# Patient Record
Sex: Male | Born: 1935 | Race: Black or African American | Hispanic: No | Marital: Married | Smoking: Never smoker
Health system: Southern US, Community
[De-identification: ages and names within clinical notes are randomized; demographics above are authoritative.]

## PROBLEM LIST (undated history)

## (undated) DIAGNOSIS — K760 Fatty (change of) liver, not elsewhere classified: Secondary | ICD-10-CM

## (undated) DIAGNOSIS — E785 Hyperlipidemia, unspecified: Secondary | ICD-10-CM

## (undated) DIAGNOSIS — M1990 Unspecified osteoarthritis, unspecified site: Secondary | ICD-10-CM

## (undated) DIAGNOSIS — K209 Esophagitis, unspecified without bleeding: Secondary | ICD-10-CM

## (undated) DIAGNOSIS — D649 Anemia, unspecified: Secondary | ICD-10-CM

## (undated) DIAGNOSIS — I499 Cardiac arrhythmia, unspecified: Secondary | ICD-10-CM

## (undated) DIAGNOSIS — H9190 Unspecified hearing loss, unspecified ear: Secondary | ICD-10-CM

## (undated) DIAGNOSIS — K635 Polyp of colon: Secondary | ICD-10-CM

## (undated) DIAGNOSIS — N400 Enlarged prostate without lower urinary tract symptoms: Secondary | ICD-10-CM

## (undated) DIAGNOSIS — R233 Spontaneous ecchymoses: Secondary | ICD-10-CM

## (undated) DIAGNOSIS — M109 Gout, unspecified: Secondary | ICD-10-CM

## (undated) DIAGNOSIS — R238 Other skin changes: Secondary | ICD-10-CM

## (undated) DIAGNOSIS — K648 Other hemorrhoids: Secondary | ICD-10-CM

## (undated) DIAGNOSIS — H409 Unspecified glaucoma: Secondary | ICD-10-CM

## (undated) DIAGNOSIS — K219 Gastro-esophageal reflux disease without esophagitis: Secondary | ICD-10-CM

## (undated) DIAGNOSIS — N529 Male erectile dysfunction, unspecified: Secondary | ICD-10-CM

## (undated) DIAGNOSIS — K469 Unspecified abdominal hernia without obstruction or gangrene: Secondary | ICD-10-CM

## (undated) DIAGNOSIS — R945 Abnormal results of liver function studies: Secondary | ICD-10-CM

## (undated) DIAGNOSIS — K222 Esophageal obstruction: Secondary | ICD-10-CM

## (undated) DIAGNOSIS — J189 Pneumonia, unspecified organism: Secondary | ICD-10-CM

## (undated) DIAGNOSIS — E559 Vitamin D deficiency, unspecified: Secondary | ICD-10-CM

## (undated) DIAGNOSIS — R7302 Impaired glucose tolerance (oral): Secondary | ICD-10-CM

## (undated) DIAGNOSIS — R351 Nocturia: Secondary | ICD-10-CM

## (undated) DIAGNOSIS — H269 Unspecified cataract: Secondary | ICD-10-CM

## (undated) DIAGNOSIS — K5790 Diverticulosis of intestine, part unspecified, without perforation or abscess without bleeding: Secondary | ICD-10-CM

## (undated) DIAGNOSIS — I4891 Unspecified atrial fibrillation: Secondary | ICD-10-CM

## (undated) DIAGNOSIS — L853 Xerosis cutis: Secondary | ICD-10-CM

## (undated) DIAGNOSIS — R7989 Other specified abnormal findings of blood chemistry: Secondary | ICD-10-CM

## (undated) HISTORY — DX: Esophagitis, unspecified without bleeding: K20.90

## (undated) HISTORY — DX: Vitamin D deficiency, unspecified: E55.9

## (undated) HISTORY — DX: Other hemorrhoids: K64.8

## (undated) HISTORY — DX: Hyperlipidemia, unspecified: E78.5

## (undated) HISTORY — DX: Impaired glucose tolerance (oral): R73.02

## (undated) HISTORY — DX: Unspecified glaucoma: H40.9

## (undated) HISTORY — PX: CATARACT EXTRACTION: SUR2

## (undated) HISTORY — DX: Abnormal results of liver function studies: R94.5

## (undated) HISTORY — DX: Esophagitis, unspecified: K20.9

## (undated) HISTORY — DX: Other specified abnormal findings of blood chemistry: R79.89

## (undated) HISTORY — DX: Gout, unspecified: M10.9

## (undated) HISTORY — DX: Polyp of colon: K63.5

## (undated) HISTORY — DX: Esophageal obstruction: K22.2

## (undated) HISTORY — DX: Cardiac arrhythmia, unspecified: I49.9

## (undated) HISTORY — DX: Male erectile dysfunction, unspecified: N52.9

## (undated) HISTORY — DX: Fatty (change of) liver, not elsewhere classified: K76.0

## (undated) HISTORY — DX: Benign prostatic hyperplasia without lower urinary tract symptoms: N40.0

## (undated) HISTORY — DX: Unspecified atrial fibrillation: I48.91

## (undated) HISTORY — DX: Unspecified cataract: H26.9

## (undated) HISTORY — DX: Unspecified hearing loss, unspecified ear: H91.90

## (undated) HISTORY — DX: Anemia, unspecified: D64.9

## (undated) HISTORY — DX: Diverticulosis of intestine, part unspecified, without perforation or abscess without bleeding: K57.90

## (undated) HISTORY — DX: Pneumonia, unspecified organism: J18.9

---

## 1997-08-05 ENCOUNTER — Other Ambulatory Visit: Admission: RE | Admit: 1997-08-05 | Discharge: 1997-08-05 | Payer: Self-pay | Admitting: Urology

## 1998-09-28 ENCOUNTER — Other Ambulatory Visit: Admission: RE | Admit: 1998-09-28 | Discharge: 1998-09-28 | Payer: Self-pay | Admitting: Urology

## 2001-04-01 HISTORY — PX: DOPPLER ECHOCARDIOGRAPHY: SHX263

## 2001-04-01 HISTORY — PX: NM MYOVIEW LTD: HXRAD82

## 2002-01-05 ENCOUNTER — Encounter: Payer: Self-pay | Admitting: Internal Medicine

## 2003-06-21 DIAGNOSIS — D126 Benign neoplasm of colon, unspecified: Secondary | ICD-10-CM | POA: Insufficient documentation

## 2004-06-12 ENCOUNTER — Ambulatory Visit: Payer: Self-pay | Admitting: Internal Medicine

## 2004-06-26 ENCOUNTER — Ambulatory Visit: Payer: Self-pay | Admitting: Internal Medicine

## 2006-12-18 ENCOUNTER — Encounter: Admission: RE | Admit: 2006-12-18 | Discharge: 2006-12-18 | Payer: Self-pay | Admitting: Family Medicine

## 2007-01-01 ENCOUNTER — Ambulatory Visit: Payer: Self-pay | Admitting: Internal Medicine

## 2007-02-11 ENCOUNTER — Ambulatory Visit: Payer: Self-pay | Admitting: Internal Medicine

## 2007-06-09 DIAGNOSIS — Z8679 Personal history of other diseases of the circulatory system: Secondary | ICD-10-CM | POA: Insufficient documentation

## 2008-04-01 HISTORY — PX: EYE SURGERY: SHX253

## 2009-11-20 ENCOUNTER — Encounter: Admission: RE | Admit: 2009-11-20 | Discharge: 2009-11-20 | Payer: Self-pay | Admitting: Family Medicine

## 2009-11-27 ENCOUNTER — Encounter: Admission: RE | Admit: 2009-11-27 | Discharge: 2009-11-27 | Payer: Self-pay | Admitting: Family Medicine

## 2010-03-30 ENCOUNTER — Encounter
Admission: RE | Admit: 2010-03-30 | Discharge: 2010-03-30 | Payer: Self-pay | Source: Home / Self Care | Attending: Family Medicine | Admitting: Family Medicine

## 2010-07-10 ENCOUNTER — Other Ambulatory Visit: Payer: Self-pay | Admitting: Family Medicine

## 2010-07-11 ENCOUNTER — Ambulatory Visit
Admission: RE | Admit: 2010-07-11 | Discharge: 2010-07-11 | Disposition: A | Discharge status: Home / Self Care | Payer: Medicare Other | Source: Ambulatory Visit | Attending: Family Medicine | Admitting: Family Medicine

## 2010-08-14 NOTE — Assessment & Plan Note (Signed)
[facility]                         GASTROENTEROLOGY OFFICE NOTE   NAME:Randy Fuller, Randy Fuller                 MRN:          [id number]  DATE:01/01/2007                            DOB:          [birth year]    HISTORY:  This is a pleasant 77 year old gentleman with a history of  atrial fibrillation for which he is on chronic Coumadin therapy,  hyperlipidemia, benign prostatic hypertrophy, and adenomatous colon  polyps.  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  He  returned 1 year later and was found to have residual adenomatous tissue  at the polypectomy site which was removed.  His last complete colonoscopy was performed on June 26, 2004.  At that  time, there was no evidence of recurrent neoplasia.  Followup in 2 years  recommended.  The patient did receive a recall letter this past March.  Overall, he reports to me that he is doing well.  He has had no interval  medical problems requiring hospitalization or surgeries.   His GI review of systems is fairly unremarkable.  He does mention to me  over the past several months occasionally having some midline lower  abdominal discomfort which is minimal and transient.  It is not  worsening.  It is not effected by meals, defecation, or urination.  No  problems with his bowels or urine.   PAST MEDICAL HISTORY:  As above.   ALLERGIES:  No known drug allergies.   CURRENT MEDICATIONS:  1. Aspirin 81 mg daily.  2. Coumadin 5 mg daily.  3. Digoxin 0.125 mg daily.  4. Flomax 0.4 mg daily.  5. Simvastatin 80 mg daily.  6. Finasteride 5 mg daily.  7. Combigan eye drops.   PHYSICAL EXAMINATION:  GENERAL:  Well-appearing male in no acute  distress.  VITAL SIGNS:  Blood pressure is 118/72.  Heart rate is 56 and it is  regular.  His weight is 171 pounds.  HEENT:  Sclerae are anicteric.  Conjunctivae are pink.  Oral mucosa is  intact.  LUNGS:  Clear.  HEART:  Regular without murmur.  ABDOMEN:  Soft, nontender, nondistended with good bowel sounds.  No  organomegaly, mass, or obvious hernia.  EXTREMITIES:  Without edema.   IMPRESSION:  1. This is a 77 year old gentleman with a history of atrial      fibrillation for which he is on Coumadin.  2. He has undergone multiple prior colonoscopies for resection and      attention to a large tubulovillous adenoma of the cecum.  He is due      for surveillance.  3. Vague abdominal discomfort without worrisome features.  Recent      abdominal ultrasound negative.   RECOMMENDATIONS:  Colonoscopy with polypectomy if necessary.  The nature  of the procedure as well as the risks, benefits, and alternatives were  again reviewed.  He understood and agreed to proceed.  We also discussed  the pros and cons of manipulation of his Coumadin therapy.  As previous,  we will hold his Coumadin 4 days prior to the examination.  He will  continue all other medications including his aspirin.     Wilhemina Bonito. Marina Goodell, MD  Electronically Signed    JNP/MedQ  DD: 01/01/2007  DT: 01/01/2007  Job #: [id number]   cc:   Mosetta Putt, M.D.  Nanetta Batty, M.D.

## 2011-03-01 DIAGNOSIS — H401190 Primary open-angle glaucoma, unspecified eye, stage unspecified: Secondary | ICD-10-CM | POA: Insufficient documentation

## 2011-04-02 HISTORY — PX: EYE SURGERY: SHX253

## 2011-10-30 DIAGNOSIS — Z961 Presence of intraocular lens: Secondary | ICD-10-CM | POA: Insufficient documentation

## 2012-01-28 ENCOUNTER — Encounter: Payer: Self-pay | Admitting: Internal Medicine

## 2012-03-06 ENCOUNTER — Ambulatory Visit (INDEPENDENT_AMBULATORY_CARE_PROVIDER_SITE_OTHER): Payer: Medicare Other | Admitting: Internal Medicine

## 2012-03-06 ENCOUNTER — Encounter: Payer: Self-pay | Admitting: Internal Medicine

## 2012-03-06 ENCOUNTER — Telehealth: Payer: Self-pay

## 2012-03-06 VITALS — BP 140/70 | HR 68 | Ht 71.0 in | Wt 160.6 lb

## 2012-03-06 DIAGNOSIS — R1310 Dysphagia, unspecified: Secondary | ICD-10-CM

## 2012-03-06 DIAGNOSIS — D689 Coagulation defect, unspecified: Secondary | ICD-10-CM

## 2012-03-06 DIAGNOSIS — Z86010 Personal history of colonic polyps: Secondary | ICD-10-CM

## 2012-03-06 MED ORDER — MOVIPREP 100 G PO SOLR: 1.0000 | Freq: Once | ORAL | Status: DC

## 2012-03-06 NOTE — Progress Notes (Signed)
HISTORY OF PRESENT ILLNESS:  Randy Fuller is a 78 y.o. male with hyperlipidemia, benign prostatic hypertrophy, chronic atrial for relation for which she is on Coumadin, and a history of adenomatous colon polyps. He presents today regarding surveillance colonoscopy in a new complaint of dysphagia. The patient initially underwent colonoscopy in 2003. He was found to have tubulovillous adenoma. Followup examinations were performed in 2005, 2006, and most recently in 2008 (last seen). Since his last evaluation, his overall health has been good. He's had no cardiac issues. He had uneventful cataract surgery earlier this year. No lower GI complaints. No bleeding. He does report a one-year history of intermittent dysphagia, mostly to pills, but occasionally solids and liquids. No heartburn, weight loss, or abdominal pain. He did have an upper endoscopy in 2003. This was normal..  REVIEW OF SYSTEMS:  All non-GI ROS negative   Past Medical History  Diagnosis Date  . Diverticulosis   . Internal hemorrhoids   . Cataract     Past Surgical History  Procedure Date  . Cataract extraction     Both eyes    Social History Randy Fuller  reports that he has never smoked. He has never used smokeless tobacco. He reports that he does not drink alcohol or use illicit drugs.  family history includes Cancer in an unspecified family member.  No Known Allergies     PHYSICAL EXAMINATION: Vital signs: BP 140/70  Pulse 68  Ht 5\' 11"  (1.803 m)  Wt 160 lb 9.6 oz (72.848 kg)  BMI 22.40 kg/m2  SpO2 98%  Constitutional: generally well-appearing, no acute distress Psychiatric: alert and oriented x3, cooperative Eyes: extraocular movements intact, anicteric, conjunctiva pink Mouth: oral pharynx moist, no lesions Neck: supple no lymphadenopathy Cardiovascular: heart the regularly irregular, no murmur Lungs: clear to auscultation bilaterally Abdomen: soft, nontender, nondistended, no obvious  ascites, no peritoneal signs, normal bowel sounds, no organomegaly Rectal:deferred until colonoscopy Extremities: no lower extremity edema bilaterally Skin: no lesions on visible extremities Neuro: No focal deficits.   ASSESSMENT:  #1. Intermittent pill occasional food dysphagia as described. Rule out significant stricture. Normal upper endoscopy 2003 #2. History of villous and adenomatous polyps. Last exam 2008. Due for followup. Appropriate candidate without contraindication. He is interested in pursuing followup. #3. History of chronic atrial fibrillation on chronic Coumadin therapy for years. Stable. Coumadin interrupted for his last exam.   PLAN:  #1. Surveillance colonoscopy and upper endoscopy with possible esophageal dilation. The patient is higher than baseline risk due to his Coumadin.The nature of the procedure, as well as the risks, benefits, and alternatives were carefully and thoroughly reviewed with the patient. Ample time for discussion and questions allowed. The patient understood, was satisfied, and agreed to proceed.  #2Movi prep prescribed. The patient instructed on its use. #3. Hold Coumadin 3 days prior to the procedures. We will confirm acceptability with his cardiologist.

## 2012-03-06 NOTE — Telephone Encounter (Signed)
03/06/2012    RE: Randy Fuller DOB: April 23, 1933 MRN: [id number]   Dear Dr. Allyson Sabal,    We have scheduled the above patient for an endoscopic procedure. Our records show that he is on anticoagulation therapy.   Please advise as to how long the patient may come off his therapy of Coumadin prior to the procedure, which is scheduled for 03-20-12.  Please fax back/ or route the completed form to [place] at [phone].   Sincerely,  Gracy Racer

## 2012-03-06 NOTE — Patient Instructions (Addendum)
You have been scheduled for an endoscopy and colonoscopy with propofol. Please follow the written instructions given to you at your visit today. Please pick up your prep at the pharmacy within the next 1-3 days. If you use inhalers (even only as needed) or a CPAP machine, please bring them with you on the day of your procedure. 

## 2012-03-09 ENCOUNTER — Other Ambulatory Visit: Payer: Self-pay

## 2012-03-09 ENCOUNTER — Telehealth: Payer: Self-pay

## 2012-03-09 NOTE — Telephone Encounter (Signed)
Sent pt new instructions for procedure reflecting new times and date

## 2012-03-20 ENCOUNTER — Encounter: Payer: Medicare Other | Admitting: Internal Medicine

## 2012-04-08 ENCOUNTER — Encounter: Payer: Self-pay | Admitting: Internal Medicine

## 2012-04-08 ENCOUNTER — Ambulatory Visit (AMBULATORY_SURGERY_CENTER): Payer: Medicare Other | Admitting: Internal Medicine

## 2012-04-08 VITALS — BP 157/88 | HR 85 | Temp 97.0°F | Resp 28 | Ht 71.0 in | Wt 160.0 lb

## 2012-04-08 DIAGNOSIS — Z86010 Personal history of colonic polyps: Secondary | ICD-10-CM

## 2012-04-08 DIAGNOSIS — Z1211 Encounter for screening for malignant neoplasm of colon: Secondary | ICD-10-CM

## 2012-04-08 DIAGNOSIS — K2100 Gastro-esophageal reflux disease with esophagitis, without bleeding: Secondary | ICD-10-CM

## 2012-04-08 DIAGNOSIS — K222 Esophageal obstruction: Secondary | ICD-10-CM

## 2012-04-08 DIAGNOSIS — R1310 Dysphagia, unspecified: Secondary | ICD-10-CM

## 2012-04-08 DIAGNOSIS — K2900 Acute gastritis without bleeding: Secondary | ICD-10-CM

## 2012-04-08 DIAGNOSIS — K5730 Diverticulosis of large intestine without perforation or abscess without bleeding: Secondary | ICD-10-CM

## 2012-04-08 DIAGNOSIS — K2980 Duodenitis without bleeding: Secondary | ICD-10-CM

## 2012-04-08 MED ORDER — OMEPRAZOLE 20 MG PO CPDR: 20.0000 mg | DELAYED_RELEASE_CAPSULE | Freq: Every day | ORAL | Status: DC

## 2012-04-08 MED ORDER — SODIUM CHLORIDE 0.9 % IV SOLN: 500.0000 mL | INTRAVENOUS | Status: DC

## 2012-04-08 NOTE — Patient Instructions (Addendum)
Impressions/recommendations:  Colonoscopy:  Diverticulosis (handout given) High Fiber Diet (handout given)  GI Follow up care as needed.  Endoscopy:  Esophagitis with edema. (handout given) Dilation diet (handout given) Gastritis (handout given)  Resume previous medications including coumadin. Begin omeprazole 20 mg by mouth daily.  YOU HAD AN ENDOSCOPIC PROCEDURE TODAY AT THE [facility]: Refer to the procedure report that was given to you for any specific questions about what was found during the examination.  If the procedure report does not answer your questions, please call your gastroenterologist to clarify.  If you requested that your care partner not be given the details of your procedure findings, then the procedure report has been included in a sealed envelope for you to review at your convenience later.  YOU SHOULD EXPECT: Some feelings of bloating in the abdomen. Passage of more gas than usual.  Walking can help get rid of the air that was put into your GI tract during the procedure and reduce the bloating. If you had a lower endoscopy (such as a colonoscopy or flexible sigmoidoscopy) you may notice spotting of blood in your stool or on the toilet paper. If you underwent a bowel prep for your procedure, then you may not have a normal bowel movement for a few days.  DIET: Your first meal following the procedure should be a light meal and then it is ok to progress to your normal diet.  A half-sandwich or bowl of soup is an example of a good first meal.  Heavy or fried foods are harder to digest and may make you feel nauseous or bloated.  Likewise meals heavy in dairy and vegetables can cause extra gas to form and this can also increase the bloating.  Drink plenty of fluids but you should avoid alcoholic beverages for 24 hours.  ACTIVITY: Your care partner should take you home directly after the procedure.  You should plan to take it easy, moving slowly for the rest of  the day.  You can resume normal activity the day after the procedure however you should NOT DRIVE or use heavy machinery for 24 hours (because of the sedation medicines used during the test).    SYMPTOMS TO REPORT IMMEDIATELY: A gastroenterologist can be reached at any hour.  During normal business hours, 8:30 AM to 5:00 PM Monday through Friday, call [phone].  After hours and on weekends, please call the GI answering service at [phone] who will take a message and have the physician on call contact you.   Following lower endoscopy (colonoscopy or flexible sigmoidoscopy):  Excessive amounts of blood in the stool  Significant tenderness or worsening of abdominal pains  Swelling of the abdomen that is new, acute  Fever of 100F or higher  Following upper endoscopy (EGD)  Vomiting of blood or coffee ground material  New chest pain or pain under the shoulder blades  Painful or persistently difficult swallowing  New shortness of breath  Fever of 100F or higher  Black, tarry-looking stools  FOLLOW UP: If any biopsies were taken you will be contacted by phone or by letter within the next 1-3 weeks.  Call your gastroenterologist if you have not heard about the biopsies in 3 weeks.  Our staff will call the home number listed on your records the next business day following your procedure to check on you and address any questions or concerns that you may have at that time regarding the information given to you following your procedure. This  is a courtesy call and so if there is no answer at the home number and we have not heard from you through the emergency physician on call, we will assume that you have returned to your regular daily activities without incident.  SIGNATURES/CONFIDENTIALITY: You and/or your care partner have signed paperwork which will be entered into your electronic medical record.  These signatures attest to the fact that that the information above on your After Visit  Summary has been reviewed and is understood.  Full responsibility of the confidentiality of this discharge information lies with you and/or your care-partner.

## 2012-04-08 NOTE — Op Note (Signed)
[facility] [address] [place], [address]   COLONOSCOPY PROCEDURE REPORT  PATIENT: Randy Fuller, Randy Fuller  MR#: [id number] BIRTHDATE: 1933/10/29 , 78  yrs. old GENDER: Male ENDOSCOPIST: Roxy Cedar, MD REFERRED XB:[id number] Program Recall PROCEDURE DATE:  04/08/2012 PROCEDURE:   Colonoscopy, surveillance ASA CLASS:   Class II INDICATIONS:Patient's personal history of adenomatous (TVA, TA)colon polyps- 2003,05,06,08. MEDICATIONS: MAC sedation, administered by CRNA and propofol (Diprivan) 120mg  IV  DESCRIPTION OF PROCEDURE:   After the risks benefits and alternatives of the procedure were thoroughly explained, informed consent was obtained.  A digital rectal exam revealed no abnormalities of the rectum.   The LB CF-H180AL [id number]  endoscope was introduced through the anus and advanced to the cecum, which was identified by both the appendix and ileocecal valve. No adverse events experienced.   The quality of the prep was excellent, using MoviPrep  The instrument was then slowly withdrawn as the colon was fully examined.      COLON FINDINGS: Moderate diverticulosis was noted in the sigmoid colon.   The colon mucosa was otherwise normal.  Retroflexed views revealed internal hemorrhoids. The time to cecum=3 minutes 53 seconds.  Withdrawal time=10 minutes 08 seconds.  The scope was withdrawn and the procedure completed. COMPLICATIONS: There were no complications.  ENDOSCOPIC IMPRESSION: 1.   Moderate diverticulosis was noted in the sigmoid colon 2.   The colon mucosa was otherwise normal  RECOMMENDATIONS: 1.  Return to the care of your primary provider.  GI follow up as needed 2.  Upper endoscopy today (see report)   eSigned:  Roxy Cedar, MD 04/08/2012 4:12 PM   cc: Mosetta Putt, MD and The Patient

## 2012-04-08 NOTE — Op Note (Signed)
[facility] [address] [place], [address]   ENDOSCOPY PROCEDURE REPORT  PATIENT: Randy Fuller, Randy Fuller  MR#: [id number] BIRTHDATE: [birth year] , 78  yrs. old GENDER: Male ENDOSCOPIST: Roxy Cedar, MD REFERRED BY:  .  Self / Office PROCEDURE DATE:  04/08/2012 PROCEDURE:  EGD w/ biopsy and Maloney dilation of esophagus  -69 F ASA CLASS:     Class II INDICATIONS:  Dysphagia. MEDICATIONS: MAC sedation, administered by CRNA and propofol (Diprivan) 130mg  IV TOPICAL ANESTHETIC: Cetacaine Spray  DESCRIPTION OF PROCEDURE: After the risks benefits and alternatives of the procedure were thoroughly explained, informed consent was obtained.  The LB GIF-H180 [id number] endoscope was introduced through the mouth and advanced to the second portion of the duodenum. Without limitations.  The instrument was slowly withdrawn as the mucosa was fully examined.      Mild distal esophagitis with edema and 15mm stricture.  Multiple antral and duodenal erosions.  Otherwise normal EGD.  CLO bx taken. THERAPY: 5 F Maloney dilator passed without resistance or heme. Retroflexed views revealed no abnormalities.     The scope was then withdrawn from the patient and the procedure completed.  COMPLICATIONS: There were no complications. ENDOSCOPIC IMPRESSION: 1.   Mild distal esophagitis with edema and 15mm stricture.  S/P dilation 2.   Multiple antral and duodenal erosions.  S/P CLO test   RECOMMENDATIONS: 1.  Resume previous medications - including coumadin today 2.  Rx CLO if positive 3.  PRESCRIBE OMEPRAZOLE 20 MG PO QD; #30; 11 REFILLS  REPEAT EXAM:  eSigned:  Roxy Cedar, MD 04/08/2012 4:28 PM  NW:GNFAO Blomgren, MD and The Patient

## 2012-04-08 NOTE — Progress Notes (Signed)
Propofol given over incremental dosages 

## 2012-04-08 NOTE — Progress Notes (Signed)
Patient did not experience any of the following events: a burn prior to discharge; a fall within the facility; wrong site/side/patient/procedure/implant event; or a hospital transfer or hospital admission upon discharge from the facility. (G8907) Patient did not have preoperative order for IV antibiotic SSI prophylaxis. (G8918)  

## 2012-04-09 ENCOUNTER — Telehealth: Payer: Self-pay | Admitting: *Deleted

## 2012-04-09 LAB — HELICOBACTER PYLORI SCREEN-BIOPSY: UREASE: NEGATIVE

## 2012-04-09 NOTE — Telephone Encounter (Signed)
  Follow up Call-  Call back number 04/08/2012  Post procedure Call Back phone  # [phone]  Permission to leave phone message Yes     Patient questions:  Do you have a fever, pain , or abdominal swelling? no Pain Score  0 *  Have you tolerated food without any problems? yes  Have you been able to return to your normal activities? yes  Do you have any questions about your discharge instructions: Diet   no Medications  no Follow up visit  no  Do you have questions or concerns about your Care? no  Actions: * If pain score is 4 or above: No action needed, pain <4.

## 2012-06-01 DIAGNOSIS — H34831 Tributary (branch) retinal vein occlusion, right eye, with macular edema: Secondary | ICD-10-CM | POA: Insufficient documentation

## 2012-07-26 ENCOUNTER — Emergency Department (HOSPITAL_COMMUNITY)
Admission: EM | Admit: 2012-07-26 | Discharge: 2012-07-26 | Disposition: A | Discharge status: Home / Self Care | Payer: Medicare Other | Attending: Emergency Medicine | Admitting: Emergency Medicine

## 2012-07-26 ENCOUNTER — Emergency Department (HOSPITAL_COMMUNITY): Payer: Medicare Other

## 2012-07-26 ENCOUNTER — Encounter (HOSPITAL_COMMUNITY): Payer: Self-pay | Admitting: Nurse Practitioner

## 2012-07-26 DIAGNOSIS — Z8719 Personal history of other diseases of the digestive system: Secondary | ICD-10-CM | POA: Insufficient documentation

## 2012-07-26 DIAGNOSIS — R5383 Other fatigue: Secondary | ICD-10-CM | POA: Insufficient documentation

## 2012-07-26 DIAGNOSIS — Z7982 Long term (current) use of aspirin: Secondary | ICD-10-CM | POA: Insufficient documentation

## 2012-07-26 DIAGNOSIS — M109 Gout, unspecified: Secondary | ICD-10-CM | POA: Insufficient documentation

## 2012-07-26 DIAGNOSIS — R5381 Other malaise: Secondary | ICD-10-CM | POA: Insufficient documentation

## 2012-07-26 DIAGNOSIS — R531 Weakness: Secondary | ICD-10-CM

## 2012-07-26 DIAGNOSIS — N400 Enlarged prostate without lower urinary tract symptoms: Secondary | ICD-10-CM | POA: Insufficient documentation

## 2012-07-26 DIAGNOSIS — Z794 Long term (current) use of insulin: Secondary | ICD-10-CM | POA: Insufficient documentation

## 2012-07-26 DIAGNOSIS — I4891 Unspecified atrial fibrillation: Secondary | ICD-10-CM | POA: Insufficient documentation

## 2012-07-26 DIAGNOSIS — Z862 Personal history of diseases of the blood and blood-forming organs and certain disorders involving the immune mechanism: Secondary | ICD-10-CM | POA: Insufficient documentation

## 2012-07-26 DIAGNOSIS — R05 Cough: Secondary | ICD-10-CM

## 2012-07-26 DIAGNOSIS — R059 Cough, unspecified: Secondary | ICD-10-CM

## 2012-07-26 DIAGNOSIS — Z79899 Other long term (current) drug therapy: Secondary | ICD-10-CM | POA: Insufficient documentation

## 2012-07-26 DIAGNOSIS — E785 Hyperlipidemia, unspecified: Secondary | ICD-10-CM | POA: Insufficient documentation

## 2012-07-26 HISTORY — DX: Unspecified abdominal hernia without obstruction or gangrene: K46.9

## 2012-07-26 LAB — URINE MICROSCOPIC-ADD ON

## 2012-07-26 LAB — URINALYSIS, ROUTINE W REFLEX MICROSCOPIC
Hgb urine dipstick: NEGATIVE
Specific Gravity, Urine: 1.029 (ref 1.005–1.030)
Urobilinogen, UA: 1 mg/dL (ref 0.0–1.0)

## 2012-07-26 LAB — BASIC METABOLIC PANEL
BUN: 17 mg/dL (ref 6–23)
GFR calc non Af Amer: 55 mL/min — ABNORMAL LOW (ref >90)
Glucose, Bld: 100 mg/dL — ABNORMAL HIGH (ref 70–99)
Potassium: 4 mEq/L (ref 3.5–5.1)

## 2012-07-26 LAB — CBC
HCT: 39.3 % (ref 39.0–52.0)
Hemoglobin: 13.6 g/dL (ref 13.0–17.0)
MCH: 29.5 pg (ref 26.0–34.0)
MCHC: 34.6 g/dL (ref 30.0–36.0)
MCV: 85.2 fL (ref 78.0–100.0)

## 2012-07-26 MED ORDER — HYDROCOD POLST-CHLORPHEN POLST 10-8 MG/5ML PO LQCR: 5.0000 mL | Freq: Two times a day (BID) | ORAL | Status: DC

## 2012-07-26 MED ORDER — LORATADINE 10 MG PO TABS: 10.0000 mg | ORAL_TABLET | Freq: Every day | ORAL | Status: DC

## 2012-07-26 MED ORDER — HYDROCOD POLST-CHLORPHEN POLST 10-8 MG/5ML PO LQCR
5.0000 mL | Freq: Once | ORAL | Status: AC
  Administered 2012-07-26: 5 mL via ORAL
  Filled 2012-07-26: qty 5

## 2012-07-26 NOTE — ED Notes (Signed)
Pt states understanding of discharge instructions 

## 2012-07-26 NOTE — ED Provider Notes (Signed)
History     CSN: [id number]  Arrival date & time 07/26/12  1556   None     Chief Complaint  Patient presents with  . Cough    (Consider location/radiation/quality/duration/timing/severity/associated sxs/prior treatment) HPI Comments: 54 y M with PMH of afib on Coumadin/dig and anemia here for several complaints including a persistent cough that has been present for approx 2 weeks and decreased energy and PO intake over the past few days.  Of note, he is also followed by Urology for elevated PSA levels.  On ROS, he did endorse some congestion and mild dysuria.  No fevers, chills, n/v, diarrhea, melena, hematochezia, hemoptysis, sick contacts, abd pain or other complaints.  He denies recent CP, SOB, DOE, orthopnea and PND.  Never a smoker.  No difficulty swallowing.   The history is provided by the patient.    Past Medical History  Diagnosis Date  . Diverticulosis   . Internal hemorrhoids   . Cataract   . Irregular heart beat   . Anemia   . Atrial fibrillation   . Benign prostate hyperplasia   . Hyperlipidemia   . Gout   . Hernia     Past Surgical History  Procedure Laterality Date  . Cataract extraction      Both eyes    Family History  Problem Relation Age of Onset  . Cancer      Sister/ not sure what kind    History  Substance Use Topics  . Smoking status: Never Smoker   . Smokeless tobacco: Never Used  . Alcohol Use: No      Review of Systems  All other systems reviewed and are negative.    Allergies  Review of patient's allergies indicates no known allergies.  Home Medications   Current Outpatient Rx  Name  Route  Sig  Dispense  Refill  . allopurinol (ZYLOPRIM) 300 MG tablet   Oral   Take 300 mg by mouth daily. 1 1//2 daily         . aspirin 81 MG chewable tablet   Oral   Chew 81 mg by mouth daily.         [name] atorvastatin (LIPITOR) 20 MG tablet   Oral   Take 20 mg by mouth daily.         . colchicine 0.6 MG tablet   Oral   Take  0.6 mg by mouth 2 (two) times daily.         . digoxin (LANOXIN) 0.125 MG tablet   Oral   Take 0.125 mg by mouth daily.         . dorzolamide (TRUSOPT) 2 % ophthalmic solution   Both Eyes   Place 1 drop into both eyes 3 (three) times daily.         . dorzolamide-timolol (COSOPT) 22.3-6.8 MG/ML ophthalmic solution               . finasteride (PROSCAR) 5 MG tablet   Oral   Take 5 mg by mouth daily.         [name] latanoprost (XALATAN) 0.005 % ophthalmic solution   Both Eyes   Place 1 drop into both eyes daily.         [name] omeprazole (PRILOSEC) 20 MG capsule   Oral   Take 1 capsule (20 mg total) by mouth daily.   30 capsule   11   . sildenafil (VIAGRA) 100 MG tablet   Oral   Take 100 mg by mouth  daily as needed.         . Tamsulosin HCl (FLOMAX) 0.4 MG CAPS   Oral   Take 0.4 mg by mouth.         . warfarin (COUMADIN) 5 MG tablet   Oral   Take 5 mg by mouth daily.           BP 145/99  Pulse 101  Temp(Src) 97.8 F (36.6 C) (Oral)  Resp 18  SpO2 97%  Physical Exam  Vitals reviewed. Constitutional: He is oriented to person, place, and time. He appears well-developed and well-nourished. No distress.  HENT:  Head: Normocephalic.  Right Ear: External ear normal.  Left Ear: External ear normal.  Nose: Nose normal.  Mouth/Throat: Oropharynx is clear and moist. No oropharyngeal exudate.  Eyes: Conjunctivae and EOM are normal. Pupils are equal, round, and reactive to light.  Neck: Normal range of motion. Neck supple.  Cardiovascular: Normal heart sounds and intact distal pulses.  Exam reveals no gallop and no friction rub.   No murmur heard. Irregular rhythm  Pulmonary/Chest: Effort normal and breath sounds normal.  Abdominal: Soft. Bowel sounds are normal. He exhibits no distension. There is no tenderness.  Musculoskeletal: Normal range of motion. He exhibits no edema and no tenderness.  Neurological: He is alert and oriented to person, place, and time.  No cranial nerve deficit or sensory deficit.  Skin: Skin is warm and dry.  Psychiatric: He has a normal mood and affect.    ED Course  Procedures (including critical care time)  Labs Reviewed  CBC - Abnormal; Notable for the following:    WBC 11.1 (*)    All other components within normal limits  BASIC METABOLIC PANEL - Abnormal; Notable for the following:    Glucose, Bld 100 (*)    GFR calc non Af Amer 55 (*)    GFR calc Af Amer 64 (*)    All other components within normal limits  URINALYSIS, ROUTINE W REFLEX MICROSCOPIC - Abnormal; Notable for the following:    Color, Urine AMBER (*)    Bilirubin Urine SMALL (*)    Ketones, ur 15 (*)    Protein, ur 30 (*)    All other components within normal limits  URINE MICROSCOPIC-ADD ON - Abnormal; Notable for the following:    Casts HYALINE CASTS (*)    All other components within normal limits   Dg Chest 2 View  07/26/2012  *RADIOLOGY REPORT*  Clinical Data: Dry cough.  CHEST - 2 VIEW  Comparison: None.  Findings:  Cardiopericardial silhouette within normal limits. Mediastinal contours normal. Trachea midline.  No airspace disease or effusion.  IMPRESSION: No active cardiopulmonary disease.   Original Report Authenticated By: Andreas Newport, M.D.     Date: 07/27/2012  Rate: 71  Rhythm: atrial fibrillation  QRS Axis: normal  Intervals: normal  ST/T Wave abnormalities: nonspecific T wave changes, diffusely  Conduction Disutrbances:none  Narrative Interpretation: A fib, technically poor tracing, NSTW changes diffusely  Old EKG Reviewed: none available    1. Cough   2. Weakness       MDM   64 y M with PMH of afib on Coumadin/dig and anemia here for several complaints including a persistent cough that has been present for approx 2 weeks and decreased energy and PO intake over the past few days.  Of note, he is also followed by Urology for elevated PSA levels.  On ROS, he did endorse some congestion and mild dysuria.  No  fevers,  chills, n/v, diarrhea, melena, hematochezia, hemoptysis, sick contacts, abd pain or other complaints.  He denies recent CP, SOB, DOE, orthopnea and PND.  Never a smoker.  No difficulty swallowing.  AFVSS here.  Well appearing, smiling/joking during history taking, slight hoarseness of voice noted.  Lungs clear.  Abd soft, NT.  No CVAT.  No edema/JVD.  No murmur appreciated.  HEENT WNLs.  Cough Diff Dx: Seasonal allergies/Post-nasal, GERD, PNA, bronchitis, Vocal cord polyp.  Decreased energy/PO: viral syndrome, UTI, anemia, ARF.  CBC and BMP from triage normal.  Will send a UA and obtain EKG.  History and exam did not have any components concerning for ACS.  10:50 PM UA with no signs of infection.  EKG with NSTWA and a fib.  Given pt's well appearance it is felt he is stable for discharge and close PCP f/u.  Will give info for ENT f/u should his cough/hoarseness persist.  Rx for Claritin and Tussionex. Return precautions reviewed. All questions answered and patient expressed understanding.  Disposition: Discharge  Condition: Good  Follow-up Information   Follow up with Suzanna Obey, MD. (As needed if symptoms do not improve)    Contact information:   [address] [place] [address] [phone]       Schedule an appointment as soon as possible for a visit with Carolyne Fiscal, MD.   Contact information:   [address] [place] [address] [phone]        Pt seen in conjunction with my attending, Dr. Linwood Dibbles.   Oleh Genin, MD PGY-II [facility] Emergency Medicine Resident        Oleh Genin, MD 07/27/12 [id number]

## 2012-07-26 NOTE — ED Notes (Signed)
Wait time discussed 

## 2012-07-26 NOTE — ED Notes (Signed)
Pt reports dry cough over past weeks, no pain. Wife states he has not acted like his self 'he has been lying around and won't eat." pt is A&Ox4, resp e/u

## 2012-07-26 NOTE — ED Notes (Signed)
Denies body aches, N/V/D, fever. Does says he has chills.

## 2012-07-27 NOTE — ED Provider Notes (Signed)
I reviewed and interpreted the EKG during the patient's evaluation in the ED and agree with the resident's interpretation. I saw and evaluated the patient, reviewed the resident's note and I agree with the findings and plan.  Pt in no distress.  Work up unremarkable.  Pt has been having cough and some hoarseness for an extended period of time.  ?GERD.  With his hoarseness will refer to ent .  May benefit from otolaryngoscopy.   Celene Kras, MD 07/27/12 [phone]

## 2012-07-28 ENCOUNTER — Encounter (INDEPENDENT_AMBULATORY_CARE_PROVIDER_SITE_OTHER): Payer: Self-pay | Admitting: General Surgery

## 2012-07-29 ENCOUNTER — Encounter (INDEPENDENT_AMBULATORY_CARE_PROVIDER_SITE_OTHER): Payer: Self-pay | Admitting: General Surgery

## 2012-07-29 ENCOUNTER — Ambulatory Visit (INDEPENDENT_AMBULATORY_CARE_PROVIDER_SITE_OTHER): Payer: Medicare Other | Admitting: General Surgery

## 2012-07-29 VITALS — BP 134/84 | HR 63 | Temp 97.7°F | Ht 71.0 in | Wt 153.2 lb

## 2012-07-29 DIAGNOSIS — K4090 Unilateral inguinal hernia, without obstruction or gangrene, not specified as recurrent: Secondary | ICD-10-CM

## 2012-07-29 NOTE — Progress Notes (Signed)
Patient ID: Randy Fuller, male   DOB: 05-21-1933, 79 y.o.   MRN: [id number]  Chief Complaint  Patient presents with  . New Evaluation    eval [facility]    HPI Randy Fuller is a 79 y.o. male.  The patient is a 79 year old male who was referred by Dr. Duaine Dredge for an evaluation of a left inguinal hernia. The patient states he's noticed this hernia for the last 2-3 weeks. The patient states she has a burning sensation in the area. He does state itself reducible.  HPI  Past Medical History  Diagnosis Date  . Diverticulosis   . Internal hemorrhoids   . Cataract   . Irregular heart beat   . Anemia   . Atrial fibrillation   . Benign prostate hyperplasia   . Hyperlipidemia   . Gout   . Hernia   . Glaucoma     Past Surgical History  Procedure Laterality Date  . Cataract extraction      Both eyes  . Colonoscopy  02-11-07  . Eye surgery Left 2010    glaucoma  . Eye surgery Bilateral 2013    cataract extractions and lens implants    Family History  Problem Relation Age of Onset  . Cancer      Sister/ not sure what kind  . Heart disease Sister   . Heart disease Brother   . Cirrhosis Brother   . Cancer Sister     Social History History  Substance Use Topics  . Smoking status: Never Smoker   . Smokeless tobacco: Never Used  . Alcohol Use: No    Allergies  Allergen Reactions  . Pork-Derived Products Swelling    Current Outpatient Prescriptions  Medication Sig Dispense Refill  . allopurinol (ZYLOPRIM) 300 MG tablet Take 300 mg by mouth daily. 1 1//2 daily      . aspirin 81 MG chewable tablet Chew 81 mg by mouth daily.      [name] atorvastatin (LIPITOR) 20 MG tablet Take 20 mg by mouth daily.      . betamethasone valerate lotion (VALISONE) 0.1 % Apply topically at bedtime as needed.      . cholecalciferol (VITAMIN D) 1000 UNITS tablet Take 1,000 Units by mouth daily.      . colchicine 0.6 MG tablet Take 0.6 mg by mouth 2 (two) times daily.      . digoxin (LANOXIN)  0.125 MG tablet Take 0.125 mg by mouth daily.      . DORZOLAMIDE HCL OP Apply to eye.      . dorzolamide-timolol (COSOPT) 22.3-6.8 MG/ML ophthalmic solution Place 1 drop into both eyes 2 (two) times daily.       . finasteride (PROSCAR) 5 MG tablet Take 5 mg by mouth daily.      . indomethacin (INDOCIN) 25 MG capsule Take 25 mg by mouth 3 (three) times daily with meals as needed.      . latanoprost (XALATAN) 0.005 % ophthalmic solution Place 1 drop into both eyes daily.      [name] loratadine (CLARITIN) 10 MG tablet Take 1 tablet (10 mg total) by mouth daily. One po daily x 5 days  5 tablet  0  . Multiple Vitamins-Minerals (CENTRUM SILVER PO) Take by mouth daily.      [name] omeprazole (PRILOSEC) 20 MG capsule Take 1 capsule (20 mg total) by mouth daily.  30 capsule  11  . sildenafil (VIAGRA) 100 MG tablet Take 100 mg by mouth daily as  needed.      . Tamsulosin HCl (FLOMAX) 0.4 MG CAPS Take 0.4 mg by mouth.      . triamcinolone ointment (KENALOG) 0.1 % Apply 1 application topically 2 (two) times daily as needed.      . warfarin (COUMADIN) 5 MG tablet Take 5 mg by mouth daily.       No current facility-administered medications for this visit.    Review of Systems Review of Systems  Constitutional: Negative.   HENT: Negative.   Respiratory: Negative.   Cardiovascular: Negative.   Gastrointestinal: Negative.   Genitourinary: Negative.   Neurological: Negative.   All other systems reviewed and are negative.    Blood pressure 134/84, pulse 63, temperature 97.7 F (36.5 C), temperature source Temporal, height 5\' 11"  (1.803 m), weight 153 lb 3.2 oz (69.491 kg), SpO2 94.00%.  Physical Exam Physical Exam  Constitutional: He is oriented to person, place, and time. He appears well-developed and well-nourished.  HENT:  Head: Normocephalic.  Eyes: Conjunctivae and EOM are normal. Pupils are equal, round, and reactive to light.  Neck: Normal range of motion. Neck supple.  Cardiovascular: Regular  rhythm and normal heart sounds.   Pulmonary/Chest: Effort normal and breath sounds normal.  Abdominal: Bowel sounds are normal. He exhibits no mass. There is no tenderness. There is no rebound and no guarding. A hernia is present. Hernia confirmed positive in the left inguinal area. Hernia confirmed negative in the right inguinal area.  Musculoskeletal: Normal range of motion.  Neurological: He is alert and oriented to person, place, and time.    Data Reviewed none  Assessment    79 year old male with a reducible left inguinal hernia.     Plan    1. We'll proceed to the operating room after cardiac clearance for a laparoscopic left inguinal hernia repair with mesh. The patient will require stopping his Coumadin for 5-7 days. 2.All risks and benefits were discussed with the patient, to generally include infection, bleeding, damage to surrounding structures, acute and chronic nerve pain, and recurrence. Alternatives were offered and described.  All questions were answered and the patient voiced understanding of the procedure and wishes to proceed at this point.         Randy Fuller., Randy Fuller 07/29/2012, 2:18 PM

## 2012-08-04 ENCOUNTER — Telehealth (INDEPENDENT_AMBULATORY_CARE_PROVIDER_SITE_OTHER): Payer: Self-pay

## 2012-08-04 NOTE — Telephone Encounter (Signed)
Patient calling to check to see if he's cleared for surgery.  Pre-Op cardiac clearance was sent on 07/29/12 to Dr. Allyson Sabal and patient would like an update on the status.  Please call when available @ [phone]

## 2012-08-05 NOTE — Telephone Encounter (Signed)
LMOM at 10:02 returning patient's call to let him know that I had not yet received cardiac clearance..I called Dr.Berry's office to check the status and they stated that Dr.Berrry wouldn't be back in the office until Friday 5/9.Marland KitchenI was transferred to med recs to make sure that they received the req and was told that they had not.Marland KitchenMarland KitchenI had faxed it on wed 4/30 at 2:20 and received confirmation..I confirmed fax number and it was correct so I asked if there was another fax number that I could fax to and Marletta Lor stated I could fax it to the nurses [phone] so I refaxed to Attn: [name] (Dr.Berrry's nurse) and I did receive another confirmation

## 2012-08-05 NOTE — Telephone Encounter (Signed)
Spoke with patient at 5:18 on 08/05/12 to let him know that Dr.Berry was out of the office until Friday and that I would call him as soon as I received the clearance to let him know...he understood and was satisfied

## 2012-08-11 ENCOUNTER — Encounter (INDEPENDENT_AMBULATORY_CARE_PROVIDER_SITE_OTHER): Payer: Self-pay

## 2012-08-11 ENCOUNTER — Other Ambulatory Visit: Payer: Self-pay | Admitting: *Deleted

## 2012-08-11 ENCOUNTER — Telehealth: Payer: Self-pay | Admitting: Cardiovascular Disease

## 2012-08-11 ENCOUNTER — Telehealth (INDEPENDENT_AMBULATORY_CARE_PROVIDER_SITE_OTHER): Payer: Self-pay | Admitting: General Surgery

## 2012-08-11 DIAGNOSIS — Z01818 Encounter for other preprocedural examination: Secondary | ICD-10-CM

## 2012-08-11 NOTE — Telephone Encounter (Signed)
[name] from Dr. Derrell Lolling office is asking for Cardiac Clearance on Randy Fuller . The form was faxed over on 07/29/12 and then again on 08/05/12 .Please call.

## 2012-08-11 NOTE — Telephone Encounter (Signed)
Spoke with [name]..patient needs a stress test before surgical clearance can be given and will need lovenox bridging. Pt will schedule stress test soon. I will contact [name] after myoview.

## 2012-08-11 NOTE — Telephone Encounter (Signed)
[name] returned my call checking on the status of patient's cardiac clearance: she stated that before clearance could be giving that he would need a stress test first and that the patient was aware, she transferred patient to the scheduling dept and thought that his test would be either this week or the first part of next then she would get the results to me ASAP.Randy Kitchenshe also stated that he would need lovenox bridging and their pharmacy would handle this...just waiting on clearance now

## 2012-08-13 ENCOUNTER — Encounter: Payer: Self-pay | Admitting: Pharmacist Clinician (PhC)/ Clinical Pharmacy Specialist

## 2012-08-13 ENCOUNTER — Ambulatory Visit (INDEPENDENT_AMBULATORY_CARE_PROVIDER_SITE_OTHER): Payer: Medicare Other | Admitting: Pharmacist Clinician (PhC)/ Clinical Pharmacy Specialist

## 2012-08-13 ENCOUNTER — Encounter: Payer: Self-pay | Admitting: *Deleted

## 2012-08-13 DIAGNOSIS — Z8679 Personal history of other diseases of the circulatory system: Secondary | ICD-10-CM

## 2012-08-13 DIAGNOSIS — Z7901 Long term (current) use of anticoagulants: Secondary | ICD-10-CM

## 2012-08-13 NOTE — Telephone Encounter (Signed)
A user error has taken place: encounter opened in error, closed for administrative reasons.

## 2012-08-18 ENCOUNTER — Ambulatory Visit (HOSPITAL_COMMUNITY)
Admission: RE | Admit: 2012-08-18 | Discharge: 2012-08-18 | Disposition: A | Discharge status: Home / Self Care | Payer: Medicare Other | Source: Ambulatory Visit | Attending: Internal Medicine | Admitting: Internal Medicine

## 2012-08-18 DIAGNOSIS — Z01810 Encounter for preprocedural cardiovascular examination: Secondary | ICD-10-CM

## 2012-08-18 DIAGNOSIS — I4891 Unspecified atrial fibrillation: Secondary | ICD-10-CM

## 2012-08-18 DIAGNOSIS — Z01818 Encounter for other preprocedural examination: Secondary | ICD-10-CM | POA: Insufficient documentation

## 2012-08-18 MED ORDER — REGADENOSON 0.4 MG/5ML IV SOLN
0.4000 mg | Freq: Once | INTRAVENOUS | Status: AC
  Administered 2012-08-18: 0.4 mg via INTRAVENOUS

## 2012-08-18 MED ORDER — TECHNETIUM TC 99M SESTAMIBI GENERIC - CARDIOLITE
30.1000 | Freq: Once | INTRAVENOUS | Status: AC | PRN
  Administered 2012-08-18: 30.1 via INTRAVENOUS

## 2012-08-18 MED ORDER — TECHNETIUM TC 99M SESTAMIBI GENERIC - CARDIOLITE
10.4000 | Freq: Once | INTRAVENOUS | Status: AC | PRN
  Administered 2012-08-18: 10 via INTRAVENOUS

## 2012-08-18 NOTE — Procedures (Addendum)
[facility] [address] [place] [address] [place] [address] [phone]  Cardiology Nuclear Med Study  Randy Fuller is a 79 y.o. male     MRN : [id number]     DOB: 12-05-1933  Procedure Date: 08/18/2012  Nuclear Med Background Indication for Stress Test:  Surgical Clearance History:  A-FIB Cardiac Risk Factors: Family History - CAD, Hypertension and Lipids  Symptoms:  PT DENIES SYMPTOMS   Nuclear Pre-Procedure Caffeine/Decaff Intake:  7:00pm NPO After: 5:00am   IV Site: R Wrist  IV 0.9% NS with Angio Cath:  22g  Chest Size (in):  40"  IV Started by: Emmit Pomfret, RN  Height: 5\' 11"  (1.803 m)  Cup Size: n/a  BMI:  Body mass index is 22.6 kg/(m^2). Weight:  162 lb (73.483 kg)   Tech Comments:  N/A    Nuclear Med Study 1 or 2 day study: 1 day  Stress Test Type:  Lexiscan  Order Authorizing Provider:  Nanetta Batty, MD   Resting Radionuclide: Technetium 53m Sestamibi  Resting Radionuclide Dose: 10.4 mCi   Stress Radionuclide:  Technetium 62m Sestamibi  Stress Radionuclide Dose: 30.1 mCi           Stress Protocol Rest HR: 52 Stress HR: 85  Rest BP:151/98 Stress BP: 153/99  Exercise Time (min): n/a METS: n/a          Dose of Adenosine (mg):  n/a Dose of Lexiscan: 0.4 mg  Dose of Atropine (mg): n/a Dose of Dobutamine: n/a mcg/kg/min (at max HR)  Stress Test Technologist: Ernestene Mention, CCT Nuclear Technologist: Gonzella Lex, CNMT   Rest Procedure:  Myocardial perfusion imaging was performed at rest 45 minutes following the intravenous administration of Technetium 71m Sestamibi. Stress Procedure:  The patient received IV Lexiscan 0.4 mg over 15-seconds.  Technetium 25m Sestamibi injected at 30-seconds.  There were no significant changes with Lexiscan.  Quantitative spect images were obtained after a 45 minute delay.  Transient Ischemic Dilatation (Normal <1.22):  1.02  Lung/Heart Ratio (Normal <0.45):  0.25 QGS EDV:   85 ml QGS ESV:  31 ml LV Ejection Fraction: 64%  Rest ECG: Atrial Fibrilliation  Stress ECG: No significant change from baseline ECG  QPS Raw Data Images:  Normal; no motion artifact; normal heart/lung ratio. Stress Images:  Normal homogeneous uptake in all areas of the myocardium. Rest Images:  Normal homogeneous uptake in all areas of the myocardium. Subtraction (SDS):  No evidence of ischemia.  Impression Exercise Capacity:  Lexiscan with no exercise. BP Response:  Normal blood pressure response. Clinical Symptoms:  No significant symptoms noted. ECG Impression:  No significant ECG changes with Lexiscan. Comparison with Prior Nuclear Study: Prior study in 2003 showed subtle ischemia, however, there is no evidence for that today.  Overall Impression:  Normal stress nuclear study.  LV Wall Motion:  NL LV Function; NL Wall Motion; EF 64%  Chrystie Nose, MD, [facility] Board Certified in Nuclear Cardiology Attending Cardiologist [facility]  Chrystie Nose, MD  08/18/2012 6:11 PM

## 2012-08-19 ENCOUNTER — Telehealth: Payer: Self-pay | Admitting: *Deleted

## 2012-08-19 ENCOUNTER — Encounter: Payer: Self-pay | Admitting: *Deleted

## 2012-08-19 ENCOUNTER — Telehealth (INDEPENDENT_AMBULATORY_CARE_PROVIDER_SITE_OTHER): Payer: Self-pay | Admitting: General Surgery

## 2012-08-19 NOTE — Telephone Encounter (Signed)
Patient needs clearance for left inguinal hernia repair.  Is he cleared for surgery?  At what risk...acceptable, low, mod, high? He will hold coumadin,but will be bridged with lovenox per your recommendation.

## 2012-08-19 NOTE — Telephone Encounter (Signed)
Message copied by Marella Bile on Wed Aug 19, 2012  1:14 PM ------      Message from: Runell Gess      Created: Wed Aug 19, 2012  6:09 AM       Call and tell normal ------

## 2012-08-19 NOTE — Telephone Encounter (Signed)
Patient called and wanted to know if you received his cardiac clearance yet. Please call patient once you receive it

## 2012-08-20 ENCOUNTER — Telehealth: Payer: Self-pay | Admitting: Cardiovascular Disease

## 2012-08-20 NOTE — Telephone Encounter (Signed)
Randy Fuller wants to let you know that the stress test was successful and he needs to know when to stop his coumadin and to write him a prescription.. Please Call [phone]

## 2012-08-20 NOTE — Telephone Encounter (Signed)
Clear for surgery with low-risk Myoview hold Coumadin, Lovenox bridge

## 2012-08-21 ENCOUNTER — Telehealth (INDEPENDENT_AMBULATORY_CARE_PROVIDER_SITE_OTHER): Payer: Self-pay | Admitting: General Surgery

## 2012-08-21 ENCOUNTER — Encounter: Payer: Self-pay | Admitting: *Deleted

## 2012-08-21 NOTE — Telephone Encounter (Signed)
Letter with clearance was sent to [facility]

## 2012-08-21 NOTE — Telephone Encounter (Signed)
Called to let patient know that I had received cardiac clearance and I was taking the orders over to the schedulers now...they should be calling by Tuesday..if he hadn't heard anything he could call me and I would check the status...patient was pleased and was told to hold ASA 7 days prior and once we have the surgery date we can call Dr.Berry's office to do the coumadin bridging

## 2012-08-25 ENCOUNTER — Encounter (INDEPENDENT_AMBULATORY_CARE_PROVIDER_SITE_OTHER): Payer: Self-pay

## 2012-08-26 ENCOUNTER — Encounter: Payer: Self-pay | Admitting: Pharmacist Clinician (PhC)/ Clinical Pharmacy Specialist

## 2012-08-26 MED ORDER — ENOXAPARIN SODIUM 60 MG/0.6ML ~~LOC~~ SOLN: SUBCUTANEOUS | Status: DC

## 2012-08-26 NOTE — Telephone Encounter (Signed)
Surgery scheduled for June 2.  Will stop warfarin today, Lovenox bridge.

## 2012-08-27 ENCOUNTER — Encounter: Payer: Self-pay | Admitting: Pharmacist Clinician (PhC)/ Clinical Pharmacy Specialist

## 2012-08-28 ENCOUNTER — Encounter (HOSPITAL_COMMUNITY)
Admission: RE | Admit: 2012-08-28 | Discharge: 2012-08-28 | Disposition: A | Discharge status: Home / Self Care | Payer: Medicare Other | Source: Ambulatory Visit | Attending: General Surgery | Admitting: General Surgery

## 2012-08-28 ENCOUNTER — Encounter (HOSPITAL_COMMUNITY): Payer: Self-pay

## 2012-08-28 DIAGNOSIS — Z01812 Encounter for preprocedural laboratory examination: Secondary | ICD-10-CM | POA: Diagnosis not present

## 2012-08-28 DIAGNOSIS — R339 Retention of urine, unspecified: Secondary | ICD-10-CM | POA: Diagnosis not present

## 2012-08-28 DIAGNOSIS — Z79899 Other long term (current) drug therapy: Secondary | ICD-10-CM | POA: Diagnosis not present

## 2012-08-28 DIAGNOSIS — K4090 Unilateral inguinal hernia, without obstruction or gangrene, not specified as recurrent: Secondary | ICD-10-CM | POA: Diagnosis present

## 2012-08-28 DIAGNOSIS — Z7901 Long term (current) use of anticoagulants: Secondary | ICD-10-CM | POA: Diagnosis not present

## 2012-08-28 HISTORY — DX: Xerosis cutis: L85.3

## 2012-08-28 HISTORY — DX: Unspecified osteoarthritis, unspecified site: M19.90

## 2012-08-28 HISTORY — DX: Benign prostatic hyperplasia without lower urinary tract symptoms: N40.0

## 2012-08-28 HISTORY — DX: Spontaneous ecchymoses: R23.3

## 2012-08-28 HISTORY — DX: Nocturia: R35.1

## 2012-08-28 HISTORY — DX: Other skin changes: R23.8

## 2012-08-28 HISTORY — DX: Gastro-esophageal reflux disease without esophagitis: K21.9

## 2012-08-28 LAB — BASIC METABOLIC PANEL
BUN: 15 mg/dL (ref 6–23)
GFR calc non Af Amer: 64 mL/min — ABNORMAL LOW (ref >90)
Glucose, Bld: 87 mg/dL (ref 70–99)
Potassium: 4.3 mEq/L (ref 3.5–5.1)

## 2012-08-28 LAB — CBC
HCT: 36.4 % — ABNORMAL LOW (ref 39.0–52.0)
Hemoglobin: 12.4 g/dL — ABNORMAL LOW (ref 13.0–17.0)
MCH: 29.8 pg (ref 26.0–34.0)
MCHC: 34.1 g/dL (ref 30.0–36.0)
MCV: 87.5 fL (ref 78.0–100.0)

## 2012-08-28 LAB — SURGICAL PCR SCREEN: Staphylococcus aureus: NEGATIVE

## 2012-08-28 MED ORDER — CHLORHEXIDINE GLUCONATE 4 % EX LIQD: 1.0000 "application " | Freq: Once | CUTANEOUS | Status: DC

## 2012-08-28 NOTE — Progress Notes (Signed)
Dr.Jonathan Allyson Sabal is cardiologist with clearance note in chart  Stress test report in epic from 08/18/12  Denies ever having an echo or heart cath--this confirmed with [facility]  EKG and CXR report in epic from 07-26-12   Medical MD is Dr.Peter Duaine Dredge

## 2012-08-28 NOTE — Progress Notes (Signed)
Notified Dr.Singer of elevated pt/inr and ptt;orders to repeat day of surgery

## 2012-08-28 NOTE — Progress Notes (Signed)
Called [facility] and no Echo found  Stress test report in epic  Cardiologist is Dr.Jonathan [employer]

## 2012-08-28 NOTE — Pre-Procedure Instructions (Signed)
Randy Fuller  08/28/2012   Your procedure is scheduled on:  Mon, June 2 @ 3:19 PM  Report to [facility] at 1:15 PM.  Call this number if you have problems the morning of surgery: [phone]   Remember:   Do not eat food or drink liquids after midnight.   Take these medicines the morning of surgery with A SIP OF WATER: Digoxin(Lanoxin),Eye Drops,Omperazole(Prilosec),and Flomax(Tamsulosin)   Do not wear jewelry  Do not wear lotions, powders, or colognes. You may wear deodorant.  Men may shave face and neck.  Do not bring valuables to the hospital.  [facility] is not responsible                   for any belongings or valuables.  Contacts, dentures or bridgework may not be worn into surgery.  Leave suitcase in the car. After surgery it may be brought to your room.  For patients admitted to the hospital, checkout time is 11:00 AM the day of  discharge.   Patients discharged the day of surgery will not be allowed to drive  home.    Special Instructions: Shower using CHG 2 nights before surgery and the night before surgery.  If you shower the day of surgery use CHG.  Use special wash - you have one bottle of CHG for all showers.  You should use approximately 1/3 of the bottle for each shower.   Please read over the following fact sheets that you were given: Pain Booklet, Coughing and Deep Breathing, MRSA Information and Surgical Site Infection Prevention

## 2012-08-30 MED ORDER — CEFAZOLIN SODIUM-DEXTROSE 2-3 GM-% IV SOLR
2.0000 g | INTRAVENOUS | Status: AC
  Administered 2012-08-31: 2 g via INTRAVENOUS
  Filled 2012-08-30: qty 50

## 2012-08-31 ENCOUNTER — Encounter (HOSPITAL_COMMUNITY)
Admission: RE | Disposition: A | Discharge status: Home / Self Care | Payer: Self-pay | Source: Ambulatory Visit | Attending: General Surgery

## 2012-08-31 ENCOUNTER — Encounter (HOSPITAL_COMMUNITY): Payer: Self-pay | Admitting: *Deleted

## 2012-08-31 ENCOUNTER — Observation Stay (HOSPITAL_COMMUNITY)
Admission: RE | Admit: 2012-08-31 | Discharge: 2012-09-01 | Disposition: A | Discharge status: Home / Self Care | Payer: Medicare Other | Source: Ambulatory Visit | Attending: General Surgery | Admitting: General Surgery

## 2012-08-31 ENCOUNTER — Encounter (HOSPITAL_COMMUNITY): Payer: Self-pay | Admitting: Anesthesiology

## 2012-08-31 ENCOUNTER — Ambulatory Visit (HOSPITAL_COMMUNITY): Payer: Medicare Other | Admitting: Anesthesiology

## 2012-08-31 DIAGNOSIS — Z01812 Encounter for preprocedural laboratory examination: Secondary | ICD-10-CM | POA: Insufficient documentation

## 2012-08-31 DIAGNOSIS — Z79899 Other long term (current) drug therapy: Secondary | ICD-10-CM | POA: Insufficient documentation

## 2012-08-31 DIAGNOSIS — R339 Retention of urine, unspecified: Secondary | ICD-10-CM | POA: Insufficient documentation

## 2012-08-31 DIAGNOSIS — Z7901 Long term (current) use of anticoagulants: Secondary | ICD-10-CM | POA: Insufficient documentation

## 2012-08-31 DIAGNOSIS — K4090 Unilateral inguinal hernia, without obstruction or gangrene, not specified as recurrent: Principal | ICD-10-CM | POA: Insufficient documentation

## 2012-08-31 HISTORY — PX: INSERTION OF MESH: SHX5868

## 2012-08-31 HISTORY — PX: HERNIA REPAIR: SHX51

## 2012-08-31 HISTORY — PX: INGUINAL HERNIA REPAIR: SHX194

## 2012-08-31 LAB — PROTIME-INR: INR: 1.19 (ref 0.00–1.49)

## 2012-08-31 SURGERY — REPAIR, HERNIA, INGUINAL, LAPAROSCOPIC
Anesthesia: General | Laterality: Left | Wound class: Clean

## 2012-08-31 MED ORDER — FINASTERIDE 5 MG PO TABS
5.0000 mg | ORAL_TABLET | Freq: Every day | ORAL | Status: DC
  Administered 2012-09-01: 5 mg via ORAL
  Filled 2012-08-31: qty 1

## 2012-08-31 MED ORDER — MORPHINE SULFATE 2 MG/ML IJ SOLN: 2.0000 mg | INTRAMUSCULAR | Status: DC | PRN

## 2012-08-31 MED ORDER — ACETAMINOPHEN 650 MG RE SUPP: 650.0000 mg | RECTAL | Status: DC | PRN

## 2012-08-31 MED ORDER — LIDOCAINE HCL (CARDIAC) 20 MG/ML IV SOLN
INTRAVENOUS | Status: DC | PRN
  Administered 2012-08-31: 75 mg via INTRAVENOUS

## 2012-08-31 MED ORDER — LACTATED RINGERS IV SOLN
INTRAVENOUS | Status: DC
  Administered 2012-08-31 – 2012-09-01 (×2): via INTRAVENOUS

## 2012-08-31 MED ORDER — OXYCODONE-ACETAMINOPHEN 10-325 MG PO TABS: 1.0000 | ORAL_TABLET | ORAL | Status: DC | PRN

## 2012-08-31 MED ORDER — SODIUM CHLORIDE 0.9 % IJ SOLN: 3.0000 mL | Freq: Two times a day (BID) | INTRAMUSCULAR | Status: DC

## 2012-08-31 MED ORDER — PROPOFOL 10 MG/ML IV BOLUS
INTRAVENOUS | Status: DC | PRN
  Administered 2012-08-31: 130 mg via INTRAVENOUS
  Administered 2012-08-31: 30 mg via INTRAVENOUS

## 2012-08-31 MED ORDER — MEPERIDINE HCL 25 MG/ML IJ SOLN: 6.2500 mg | INTRAMUSCULAR | Status: DC | PRN

## 2012-08-31 MED ORDER — ENOXAPARIN SODIUM 80 MG/0.8ML ~~LOC~~ SOLN
70.0000 mg | Freq: Two times a day (BID) | SUBCUTANEOUS | Status: DC
  Administered 2012-09-01: 11:00:00 via SUBCUTANEOUS
  Filled 2012-08-31 (×2): qty 1.2

## 2012-08-31 MED ORDER — MIDAZOLAM HCL 2 MG/2ML IJ SOLN: 0.5000 mg | Freq: Once | INTRAMUSCULAR | Status: DC | PRN

## 2012-08-31 MED ORDER — SODIUM CHLORIDE 0.9 % IV SOLN: 250.0000 mL | INTRAVENOUS | Status: DC | PRN

## 2012-08-31 MED ORDER — ONDANSETRON HCL 4 MG/2ML IJ SOLN: 4.0000 mg | Freq: Four times a day (QID) | INTRAMUSCULAR | Status: DC | PRN

## 2012-08-31 MED ORDER — FENTANYL CITRATE 0.05 MG/ML IJ SOLN
INTRAMUSCULAR | Status: DC | PRN
  Administered 2012-08-31: 100 ug via INTRAVENOUS
  Administered 2012-08-31: 50 ug via INTRAVENOUS

## 2012-08-31 MED ORDER — BUPIVACAINE HCL (PF) 0.25 % IJ SOLN
INTRAMUSCULAR | Status: AC
  Filled 2012-08-31: qty 30

## 2012-08-31 MED ORDER — ATORVASTATIN CALCIUM 20 MG PO TABS
20.0000 mg | ORAL_TABLET | Freq: Every day | ORAL | Status: DC
  Filled 2012-08-31: qty 1

## 2012-08-31 MED ORDER — DORZOLAMIDE HCL-TIMOLOL MAL 2-0.5 % OP SOLN
1.0000 [drp] | Freq: Two times a day (BID) | OPHTHALMIC | Status: DC
  Filled 2012-08-31: qty 10

## 2012-08-31 MED ORDER — ENOXAPARIN SODIUM 60 MG/0.6ML ~~LOC~~ SOLN: 60.0000 mg | Freq: Two times a day (BID) | SUBCUTANEOUS | Status: DC

## 2012-08-31 MED ORDER — 0.9 % SODIUM CHLORIDE (POUR BTL) OPTIME
TOPICAL | Status: DC | PRN
  Administered 2012-08-31: 1000 mL

## 2012-08-31 MED ORDER — DIGOXIN 125 MCG PO TABS
0.1250 mg | ORAL_TABLET | Freq: Every day | ORAL | Status: DC
  Administered 2012-09-01: 0.125 mg via ORAL
  Filled 2012-08-31: qty 1

## 2012-08-31 MED ORDER — OXYCODONE HCL 5 MG PO TABS: 5.0000 mg | ORAL_TABLET | ORAL | Status: DC | PRN

## 2012-08-31 MED ORDER — GLYCOPYRROLATE 0.2 MG/ML IJ SOLN
INTRAMUSCULAR | Status: DC | PRN
  Administered 2012-08-31: 0.6 mg via INTRAVENOUS

## 2012-08-31 MED ORDER — OXYCODONE HCL 5 MG PO TABS: 5.0000 mg | ORAL_TABLET | Freq: Once | ORAL | Status: DC | PRN

## 2012-08-31 MED ORDER — BUPIVACAINE HCL 0.25 % IJ SOLN
INTRAMUSCULAR | Status: DC | PRN
  Administered 2012-08-31: 6 mL

## 2012-08-31 MED ORDER — HYDROMORPHONE HCL PF 1 MG/ML IJ SOLN
INTRAMUSCULAR | Status: AC
  Filled 2012-08-31: qty 2

## 2012-08-31 MED ORDER — ROCURONIUM BROMIDE 100 MG/10ML IV SOLN
INTRAVENOUS | Status: DC | PRN
  Administered 2012-08-31: 50 mg via INTRAVENOUS

## 2012-08-31 MED ORDER — HYDROMORPHONE HCL PF 1 MG/ML IJ SOLN
0.2500 mg | INTRAMUSCULAR | Status: DC | PRN
  Administered 2012-08-31 (×3): 0.5 mg via INTRAVENOUS

## 2012-08-31 MED ORDER — NEOSTIGMINE METHYLSULFATE 1 MG/ML IJ SOLN
INTRAMUSCULAR | Status: DC | PRN
  Administered 2012-08-31: 3 mg via INTRAVENOUS

## 2012-08-31 MED ORDER — ACETAMINOPHEN 325 MG PO TABS: 650.0000 mg | ORAL_TABLET | ORAL | Status: DC | PRN

## 2012-08-31 MED ORDER — SODIUM CHLORIDE 0.9 % IR SOLN
Status: DC | PRN
  Administered 2012-08-31: 1000 mL

## 2012-08-31 MED ORDER — LATANOPROST 0.005 % OP SOLN
1.0000 [drp] | Freq: Every day | OPHTHALMIC | Status: DC
  Filled 2012-08-31: qty 2.5

## 2012-08-31 MED ORDER — TAMSULOSIN HCL 0.4 MG PO CAPS
0.4000 mg | ORAL_CAPSULE | Freq: Every day | ORAL | Status: DC
  Filled 2012-08-31 (×2): qty 1

## 2012-08-31 MED ORDER — SODIUM CHLORIDE 0.9 % IJ SOLN: 3.0000 mL | INTRAMUSCULAR | Status: DC | PRN

## 2012-08-31 MED ORDER — ONDANSETRON HCL 4 MG/2ML IJ SOLN
INTRAMUSCULAR | Status: DC | PRN
  Administered 2012-08-31: 4 mg via INTRAVENOUS

## 2012-08-31 MED ORDER — PROMETHAZINE HCL 25 MG/ML IJ SOLN: 6.2500 mg | INTRAMUSCULAR | Status: DC | PRN

## 2012-08-31 MED ORDER — OXYCODONE HCL 5 MG/5ML PO SOLN: 5.0000 mg | Freq: Once | ORAL | Status: DC | PRN

## 2012-08-31 MED ORDER — BETHANECHOL CHLORIDE 10 MG PO TABS
10.0000 mg | ORAL_TABLET | Freq: Three times a day (TID) | ORAL | Status: DC
  Administered 2012-09-01 (×3): 10 mg via ORAL
  Filled 2012-08-31 (×4): qty 1

## 2012-08-31 MED ORDER — LACTATED RINGERS IV SOLN
INTRAVENOUS | Status: DC | PRN
  Administered 2012-08-31: 14:00:00 via INTRAVENOUS

## 2012-08-31 SURGICAL SUPPLY — 46 items
APL SKNCLS STERI-STRIP NONHPOA (GAUZE/BANDAGES/DRESSINGS) ×1
APPLIER CLIP LOGIC TI 5 (MISCELLANEOUS) IMPLANT
APR CLP MED LRG 33X5 (MISCELLANEOUS)
BENZOIN TINCTURE PRP APPL 2/3 (GAUZE/BANDAGES/DRESSINGS) ×2 IMPLANT
CANISTER SUCTION 2500CC (MISCELLANEOUS) ×2 IMPLANT
CHLORAPREP W/TINT 26ML (MISCELLANEOUS) ×2 IMPLANT
CLOTH BEACON ORANGE TIMEOUT ST (SAFETY) ×2 IMPLANT
COVER SURGICAL LIGHT HANDLE (MISCELLANEOUS) ×2 IMPLANT
DEVICE TROCAR PUNCTURE CLOSURE (ENDOMECHANICALS) ×2 IMPLANT
DISSECT BALLN SPACEMKR + OVL (BALLOONS)
DISSECTOR BALLN SPACEMKR + OVL (BALLOONS) IMPLANT
DISSECTOR BLUNT TIP ENDO 5MM (MISCELLANEOUS) ×2 IMPLANT
DRAPE UTILITY 15X26 W/TAPE STR (DRAPE) ×4 IMPLANT
ELECT REM PT RETURN 9FT ADLT (ELECTROSURGICAL) ×2
ELECTRODE REM PT RTRN 9FT ADLT (ELECTROSURGICAL) ×1 IMPLANT
GAUZE SPONGE 2X2 8PLY STRL LF (GAUZE/BANDAGES/DRESSINGS) ×1 IMPLANT
GLOVE BIO SURGEON STRL SZ7.5 (GLOVE) ×4 IMPLANT
GLOVE BIOGEL PI IND STRL 6.5 (GLOVE) ×1 IMPLANT
GLOVE BIOGEL PI IND STRL 7.5 (GLOVE) ×1 IMPLANT
GLOVE BIOGEL PI INDICATOR 6.5 (GLOVE) ×1
GLOVE BIOGEL PI INDICATOR 7.5 (GLOVE) ×1
GOWN STRL NON-REIN LRG LVL3 (GOWN DISPOSABLE) ×4 IMPLANT
GOWN STRL REIN XL XLG (GOWN DISPOSABLE) ×2 IMPLANT
KIT BASIN OR (CUSTOM PROCEDURE TRAY) ×2 IMPLANT
KIT ROOM TURNOVER OR (KITS) ×2 IMPLANT
MESH PARIETEX 20X20CM (Mesh General) ×2 IMPLANT
NEEDLE INSUFFLATION 14GA 120MM (NEEDLE) ×2 IMPLANT
NS IRRIG 1000ML POUR BTL (IV SOLUTION) ×2 IMPLANT
PAD ARMBOARD 7.5X6 YLW CONV (MISCELLANEOUS) ×4 IMPLANT
RELOAD STAPLE HERNIA 4.0 BLUE (INSTRUMENTS) ×2 IMPLANT
RELOAD STAPLE HERNIA 4.8 BLK (STAPLE) IMPLANT
SCISSORS LAP 5X35 DISP (ENDOMECHANICALS) ×2 IMPLANT
SET IRRIG TUBING LAPAROSCOPIC (IRRIGATION / IRRIGATOR) ×2 IMPLANT
SET TROCAR LAP APPLE-HUNT 5MM (ENDOMECHANICALS) ×2 IMPLANT
SPONGE GAUZE 2X2 STER 10/PKG (GAUZE/BANDAGES/DRESSINGS) ×1
STAPLER HERNIA 12 8.5 360D (INSTRUMENTS) ×2 IMPLANT
STRIP CLOSURE SKIN 1/2X4 (GAUZE/BANDAGES/DRESSINGS) ×2 IMPLANT
SUT MNCRL AB 4-0 PS2 18 (SUTURE) ×2 IMPLANT
SUT VIC AB 1 CT1 27 (SUTURE) ×2
SUT VIC AB 1 CT1 27XBRD ANBCTR (SUTURE) ×1 IMPLANT
TAPE CLOTH SURG 4X10 WHT LF (GAUZE/BANDAGES/DRESSINGS) ×2 IMPLANT
TOWEL OR 17X24 6PK STRL BLUE (TOWEL DISPOSABLE) ×2 IMPLANT
TOWEL OR 17X26 10 PK STRL BLUE (TOWEL DISPOSABLE) ×2 IMPLANT
TRAY FOLEY CATH 14FR (SET/KITS/TRAYS/PACK) ×2 IMPLANT
TRAY LAPAROSCOPIC (CUSTOM PROCEDURE TRAY) ×2 IMPLANT
TROCAR XCEL 12X100 BLDLESS (ENDOMECHANICALS) ×2 IMPLANT

## 2012-08-31 NOTE — Progress Notes (Signed)
Dr. Janee Morn paged to report that pt is unable to urinate. Order received for in and out cath. In and out cath performed with 200cc clear yellow urine return. Pt tolerated well.  Pt taking po water without difficulty. Instructed pt that he still needs to urinate before being discharged.

## 2012-08-31 NOTE — Progress Notes (Signed)
Pt up to bathroom but still unable to urinate. Dr. Janee Morn paged.

## 2012-08-31 NOTE — Anesthesia Procedure Notes (Signed)
Procedure Name: Intubation Date/Time: 08/31/2012 2:33 PM Performed by: Gwenyth Allegra Pre-anesthesia Checklist: Patient identified, Timeout performed, Emergency Drugs available, Suction available and Patient being monitored Patient Re-evaluated:Patient Re-evaluated prior to inductionOxygen Delivery Method: Circle system utilized Preoxygenation: Pre-oxygenation with 100% oxygen Intubation Type: IV induction Ventilation: Mask ventilation without difficulty and Oral airway inserted - appropriate to patient size Laryngoscope Size: Mac and 4 Grade View: Grade I Tube type: Oral Tube size: 7.5 mm Number of attempts: 1 Airway Equipment and Method: Stylet Placement Confirmation: ETT inserted through vocal cords under direct vision,  breath sounds checked- equal and bilateral and positive ETCO2 Secured at: 22 cm Tube secured with: Tape Dental Injury: Teeth and Oropharynx as per pre-operative assessment

## 2012-08-31 NOTE — Progress Notes (Signed)
Bed assignment of 6N23 received. Report given to Mardella Layman, RN charge. Pt transported to 6N via stretcher without incident.

## 2012-08-31 NOTE — Transfer of Care (Signed)
Immediate Anesthesia Transfer of Care Note  Patient: Randy Fuller  Procedure(s) Performed: Procedure(s): LAPAROSCOPIC INGUINAL HERNIA (Left) INSERTION OF MESH (Left)  Patient Location: PACU  Anesthesia Type:General  Level of Consciousness: awake, alert  and oriented  Airway & Oxygen Therapy: Patient Spontanous Breathing and Patient connected to nasal cannula oxygen  Post-op Assessment: Report given to PACU RN and Post -op Vital signs reviewed and stable  Post vital signs: Reviewed and stable  Complications: No apparent anesthesia complications

## 2012-08-31 NOTE — Progress Notes (Signed)
Informed Dr. Janee Morn that pt taking po fluids and IVF infusing but pt still unable to urinate. Order received to admit pt to med-surg obs. Bed requested.

## 2012-08-31 NOTE — H&P (Signed)
HPI  Randy Fuller is a 79 y.o. male. The patient is a 79 year old male who was referred by Dr. Duaine Dredge for an evaluation of a left inguinal hernia. The patient states he's noticed this hernia for the last 2-3 weeks. The patient states she has a burning sensation in the area. He does state itself reducible.  HPI  Past Medical History   Diagnosis  Date   .  Diverticulosis    .  Internal hemorrhoids    .  Cataract    .  Irregular heart beat    .  Anemia    .  Atrial fibrillation    .  Benign prostate hyperplasia    .  Hyperlipidemia    .  Gout    .  Hernia    .  Glaucoma     Past Surgical History   Procedure  Laterality  Date   .  Cataract extraction       Both eyes   .  Colonoscopy   02-11-07   .  Eye surgery  Left  2010     glaucoma   .  Eye surgery  Bilateral  2013     cataract extractions and lens implants    Family History   Problem  Relation  Age of Onset   .  Cancer       Sister/ not sure what kind   .  Heart disease  Sister    .  Heart disease  Brother    .  Cirrhosis  Brother    .  Cancer  Sister    Social History  History   Substance Use Topics   .  Smoking status:  Never Smoker   .  Smokeless tobacco:  Never Used   .  Alcohol Use:  No    Allergies   Allergen  Reactions   .  Pork-Derived Products  Swelling    Current Outpatient Prescriptions   Medication  Sig  Dispense  Refill   .  allopurinol (ZYLOPRIM) 300 MG tablet  Take 300 mg by mouth daily. 1 1//2 daily     .  aspirin 81 MG chewable tablet  Chew 81 mg by mouth daily.     [name]  atorvastatin (LIPITOR) 20 MG tablet  Take 20 mg by mouth daily.     .  betamethasone valerate lotion (VALISONE) 0.1 %  Apply topically at bedtime as needed.     .  cholecalciferol (VITAMIN D) 1000 UNITS tablet  Take 1,000 Units by mouth daily.     .  colchicine 0.6 MG tablet  Take 0.6 mg by mouth 2 (two) times daily.     .  digoxin (LANOXIN) 0.125 MG tablet  Take 0.125 mg by mouth daily.     .  DORZOLAMIDE HCL OP   Apply to eye.     .  dorzolamide-timolol (COSOPT) 22.3-6.8 MG/ML ophthalmic solution  Place 1 drop into both eyes 2 (two) times daily.     .  finasteride (PROSCAR) 5 MG tablet  Take 5 mg by mouth daily.     .  indomethacin (INDOCIN) 25 MG capsule  Take 25 mg by mouth 3 (three) times daily with meals as needed.     .  latanoprost (XALATAN) 0.005 % ophthalmic solution  Place 1 drop into both eyes daily.     [name]  loratadine (CLARITIN) 10 MG tablet  Take 1 tablet (10 mg total) by mouth daily. One po daily x  5 days  5 tablet  0   .  Multiple Vitamins-Minerals (CENTRUM SILVER PO)  Take by mouth daily.     [name]  omeprazole (PRILOSEC) 20 MG capsule  Take 1 capsule (20 mg total) by mouth daily.  30 capsule  11   .  sildenafil (VIAGRA) 100 MG tablet  Take 100 mg by mouth daily as needed.     .  Tamsulosin HCl (FLOMAX) 0.4 MG CAPS  Take 0.4 mg by mouth.     .  triamcinolone ointment (KENALOG) 0.1 %  Apply 1 application topically 2 (two) times daily as needed.     .  warfarin (COUMADIN) 5 MG tablet  Take 5 mg by mouth daily.      No current facility-administered medications for this visit.   Review of Systems  Review of Systems  Constitutional: Negative.  HENT: Negative.  Respiratory: Negative.  Cardiovascular: Negative.  Gastrointestinal: Negative.  Genitourinary: Negative.  Neurological: Negative.  All other systems reviewed and are negative.  Blood pressure 134/84, pulse 63, temperature 97.7 F (36.5 C), temperature source Temporal, height 5\' 11"  (1.803 m), weight 153 lb 3.2 oz (69.491 kg), SpO2 94.00%.  Physical Exam  Physical Exam  Constitutional: He is oriented to person, place, and time. He appears well-developed and well-nourished.  HENT:  Head: Normocephalic.  Eyes: Conjunctivae and EOM are normal. Pupils are equal, round, and reactive to light.  Neck: Normal range of motion. Neck supple.  Cardiovascular: Regular rhythm and normal heart sounds.  Pulmonary/Chest: Effort normal and breath  sounds normal.  Abdominal: Bowel sounds are normal. He exhibits no mass. There is no tenderness. There is no rebound and no guarding. A hernia is present. Hernia confirmed positive in the left inguinal area. Hernia confirmed negative in the right inguinal area.  Musculoskeletal: Normal range of motion.  Neurological: He is alert and oriented to person, place, and time.  Data Reviewed  none  Assessment  79 year old male with a reducible left inguinal hernia.  Plan  1. We'll proceed to the operating roomfor a laparoscopic left inguinal hernia repair with mesh.  2.All risks and benefits were discussed with the patient, to generally include infection, bleeding, damage to surrounding structures, acute and chronic nerve pain, and recurrence. Alternatives were offered and described. All questions were answered and the patient voiced understanding of the procedure and wishes to proceed at this point.

## 2012-08-31 NOTE — Anesthesia Preprocedure Evaluation (Signed)
Anesthesia Evaluation  Patient identified by MRN, date of birth, ID band Patient awake    Reviewed: Allergy & Precautions, H&P , NPO status , Patient's Chart, lab work & pertinent test results  History of Anesthesia Complications Negative for: history of anesthetic complications  Airway Mallampati: I TM Distance: >3 FB Neck ROM: Full    Dental  (+) Teeth Intact and Dental Advisory Given   Pulmonary neg pulmonary ROS,  breath sounds clear to auscultation  Pulmonary exam normal       Cardiovascular + dysrhythmias (INR 1.19) Atrial Fibrillation Rhythm:Regular Rate:Normal  5/14 stress test: normal perfusion, no ischemia, normal LVF   Neuro/Psych negative neurological ROS     GI/Hepatic Neg liver ROS, GERD-  Medicated and Controlled,  Endo/Other  negative endocrine ROS  Renal/GU negative Renal ROS     Musculoskeletal   Abdominal   Peds  Hematology   Anesthesia Other Findings   Reproductive/Obstetrics                           Anesthesia Physical Anesthesia Plan  ASA: III  Anesthesia Plan: General   Post-op Pain Management:    Induction: Intravenous  Airway Management Planned: Oral ETT  Additional Equipment:   Intra-op Plan:   Post-operative Plan: Extubation in OR  Informed Consent: I have reviewed the patients History and Physical, chart, labs and discussed the procedure including the risks, benefits and alternatives for the proposed anesthesia with the patient or authorized representative who has indicated his/her understanding and acceptance.   Dental advisory given  Plan Discussed with: CRNA and Surgeon  Anesthesia Plan Comments: (Plan routine monitors, GETA)        Anesthesia Quick Evaluation

## 2012-08-31 NOTE — Op Note (Signed)
Pre Operative Diagnosis: LIH   Post Operative Diagnosis: large LIH  Procedure: laparoscopic Left inguinal hernia repair with mesh  Surgeon: Dr. Axel Filler  Assistant: none  Anesthesia: GETA  EBL: 5cc  Complications: none  Counts: reported as correct x 2  Findings:  The patient had a small right indirect hernia  Indications for procedure:  The patient is a 79 year old male with a left inguinal hernia for several months. Patient complained of symptomatology to his left inguinal area. The patient was taken back for elective inguinal hernia repair.  Details of the procedure: The patient was taken back to the operating room. The patient was placed in supine position with bilateral SCDs in place. After appropriate anitbiotics were confirmed, a time-out was confirmed and all facts were verified. 0.25% Marcaine was used to infiltrate the umbilical area. He was used to cut down the skin and blunt dissection was used to get the anterior fashion.  The anterior fascia was incised approximately 1 cm and the muscles were divided anteriorly. Blunt dissection was then used to create a space in the preperitoneal area. At this time a 10 mm camera was then introduced into the space and advanced the pubic tubercle and a 12 mm trocar was placed over this and insufflation was started.  At this time and space was created from medial to laterally the preperitoneal space. The hernia sac was identified in the indirect space, and was relatively large. Dissection of the hernia sac was undertaken the vas deferens was identified and protected in all parts of the case.  There was a small tear into the hernia sac. A Veress needle right upper quadrant to help evacuate the intraperitoneal air.  Once the hernia sac was taken down to approximately the umbilicus a TET 20x20 Parietex mesh was cut into shape and introduced into the preperitoneal space.  The mesh was brought over the hernia space defect and anchored into place  and secured to Cooper's ligament with 4.65mm staples from a Coviden hernia stapler. It was anchored to the anterior abdominal wall with 4.8 mm staples. The hernia sac was seen lying anterior to the mesh. There was no staples placed laterally. The insufflation was evacuated. The trochars were removed. The anterior fascia was reapproximated using #1 Vicryl on a UR- 6.  Intra-abdominal air was evacuated and the Veress needle removed. The skin was reapproximated using 4-0 Monocryl subcuticular fashion the patient was awakened from general anesthesia and taken to recovery in stable condition.

## 2012-08-31 NOTE — Preoperative (Signed)
Beta Blockers   Reason not to administer Beta Blockers:Not Applicable 

## 2012-09-01 ENCOUNTER — Telehealth (INDEPENDENT_AMBULATORY_CARE_PROVIDER_SITE_OTHER): Payer: Self-pay | Admitting: General Surgery

## 2012-09-01 ENCOUNTER — Encounter (HOSPITAL_COMMUNITY): Payer: Self-pay | Admitting: General Surgery

## 2012-09-01 DIAGNOSIS — K4090 Unilateral inguinal hernia, without obstruction or gangrene, not specified as recurrent: Secondary | ICD-10-CM | POA: Diagnosis not present

## 2012-09-01 MED ORDER — TAMSULOSIN HCL 0.4 MG PO CAPS
0.4000 mg | ORAL_CAPSULE | Freq: Every day | ORAL | Status: DC
  Administered 2012-09-01 (×2): 0.4 mg via ORAL

## 2012-09-01 NOTE — Telephone Encounter (Signed)
LMOM making patient aware post op appt scheduled 09/14/12 @ 4:30 pm. Patient aware to call back if this is not a good date or time.

## 2012-09-01 NOTE — Progress Notes (Signed)
Discharge Note.  Discharge instructions were given to pt and pt's wife. Pt was shown how to change the foley catheter leg bag to overnight bag. Both reported understanding and were able to verbalize how to change it. Rx reviewed and follow up appointments reminded. Pt reports feeling better with having the foley in and plans to follow up with his urologist.

## 2012-09-01 NOTE — Progress Notes (Signed)
Pt complaints of difficulty passing urine; last output was 200 cc via I & O cath at 1800. At 12 MN, he was scanned with 230 ml of urine; he was re-scanned again at 3 AM with 420 ml, where the NT did attempt to do straight cath but met with resistance. Dr. Janee Morn was paged and notified of event. He ordered coude catheter be placed on patient to relieve him of his difficulty voiding and Flomax be given early to help as well.

## 2012-09-01 NOTE — Progress Notes (Signed)
1610 Patient unable to void. I & O cath done with #16 coude catheter inserted obtain of blood tinge urine.Tolerated procedure well. Patient stated he feels much better. Will continue to monitor.

## 2012-09-01 NOTE — Anesthesia Postprocedure Evaluation (Signed)
Anesthesia Post Note  Patient: Randy Fuller  Procedure(s) Performed: Procedure(s) (LRB): LAPAROSCOPIC INGUINAL HERNIA (Left) INSERTION OF MESH (Left)  Anesthesia type: General  Patient location: PACU  Post pain: Pain level controlled  Post assessment: Patient's Cardiovascular Status Stable  Last Vitals:  Filed Vitals:   09/01/12 0524  BP: 191/97  Pulse: 91  Temp: 36.5 C  Resp: 18    Post vital signs: Reviewed and stable  Level of consciousness: alert  Complications: No apparent anesthesia complications

## 2012-09-01 NOTE — Progress Notes (Signed)
Pt had some blood return during insertion of coude catheter. Dr. Derrell Lolling was notified. Pt will be following up with his previous urologist, Dr. Heloise Purpura at [phone].

## 2012-09-01 NOTE — Discharge Summary (Signed)
Physician Discharge Summary  Patient ID: Randy Fuller MRN: [id number] DOB/AGE: 04/09/1933 79 y.o.  Admit date: 08/31/2012 Discharge date: 09/01/2012  Admission Diagnoses: urinary retention, s/p lap IHR  Discharge Diagnoses: Urinary retention Active Problems:   * No active hospital problems. *   Discharged Condition: good  Hospital Course: Pt underwent Lap IHR repair, please see op note for full details.  Pt was scheduled to be d/c'd but could not secondary to urinary retention.  Pt was still unable to urinate on POD1.  He otherwise had good pain control and was deemed stable for DC and Dc'd with a foley catheter and leg bag.  He was instructed to f/u with his Uro MD in one week to assess the chance to DC his foley.  Consults: None  Significant Diagnostic Studies: none  Treatments: surgery: 08/31/2012  Discharge Exam: Blood pressure 191/97, pulse 93, temperature 97.7 F (36.5 C), temperature source Oral, resp. rate 18, height 5\' 11"  (1.803 m), weight 150 lb 5.7 oz (68.2 kg), SpO2 98.00%. General appearance: alert and cooperative GI: wound c/d/i  Disposition: 01-Home or Self Care   Future Appointments Provider Department Dept Phone   09/07/2012 9:10 AM Phillips Hay, RPH-CPP [facility] [place] [phone]   09/10/2012 9:10 AM Phillips Hay, RPH-CPP [facility] [place] [phone]   09/14/2012 4:30 PM Axel Filler, MD [facility], [place] [phone]       Medication List    TAKE these medications       aspirin 81 MG chewable tablet  Chew 81 mg by mouth daily.     atorvastatin 20 MG tablet  Commonly known as:  LIPITOR  Take 20 mg by mouth daily.     betamethasone valerate lotion 0.1 %  Commonly known as:  VALISONE  Apply 1 application topically at bedtime as needed (DRY SCALP).     CENTRUM SILVER PO  Take 1 tablet by mouth daily.     Cholecalciferol 1000 UNITS capsule  Take 1,000 Units by  mouth daily.     digoxin 0.125 MG tablet  Commonly known as:  LANOXIN  Take 0.125 mg by mouth daily.     dorzolamide-timolol 22.3-6.8 MG/ML ophthalmic solution  Commonly known as:  COSOPT  Place 1 drop into both eyes 2 (two) times daily.     enoxaparin 60 MG/0.6ML injection  Commonly known as:  LOVENOX  Inject 60 mg into the skin every 12 (twelve) hours. Inject 0.47ml (60mg ) every 12 hours as directed.  Total of 16 syringes = 9.27ml     finasteride 5 MG tablet  Commonly known as:  PROSCAR  Take 5 mg by mouth daily.     latanoprost 0.005 % ophthalmic solution  Commonly known as:  XALATAN  Place 1 drop into both eyes daily.     omeprazole 20 MG capsule  Commonly known as:  PRILOSEC  Take 1 capsule (20 mg total) by mouth daily.     oxyCODONE-acetaminophen 10-325 MG per tablet  Commonly known as:  PERCOCET  Take 1 tablet by mouth every 4 (four) hours as needed for pain.     sildenafil 100 MG tablet  Commonly known as:  VIAGRA  Take 100 mg by mouth daily as needed.     tamsulosin 0.4 MG Caps  Commonly known as:  FLOMAX  Take 0.4 mg by mouth.     triamcinolone ointment 0.1 %  Commonly known as:  KENALOG  Apply 1 application topically 2 (two) times daily  as needed (DRY SKIN).     warfarin 5 MG tablet  Commonly known as:  COUMADIN  Take 5 mg by mouth daily.           Follow-up Information   Follow up with Lajean Saver, MD. Schedule an appointment as soon as possible for a visit in 2 weeks.   Contact information:   [address] [place] [address] [phone]       Follow up with Carolyne Fiscal, MD.   Contact information:   [address] [place] [address] [phone]       Follow up with Urologist. Schedule an appointment as soon as possible for a visit in 1 week.      Signed: Marigene Ehlers., Amaris Delafuente 09/01/2012, 11:47 AM

## 2012-09-02 ENCOUNTER — Telehealth (INDEPENDENT_AMBULATORY_CARE_PROVIDER_SITE_OTHER): Payer: Self-pay | Admitting: General Surgery

## 2012-09-02 NOTE — Telephone Encounter (Signed)
Patient called - he has a follow up appt with Dr Laverle Patter to have his catheter removed and is requesting his records be sent to them. I faxed patient's office note, OP note and discharge summary to [phone]. Confirmation received.

## 2012-09-03 ENCOUNTER — Telehealth (INDEPENDENT_AMBULATORY_CARE_PROVIDER_SITE_OTHER): Payer: Self-pay

## 2012-09-03 NOTE — Telephone Encounter (Signed)
Pt calling with questions about his wound care.  He has a dressing over sutures.  I told him could remove the dressing to shower, but he needs to cover the area with a gauze dressing afterwards.

## 2012-09-07 ENCOUNTER — Ambulatory Visit (INDEPENDENT_AMBULATORY_CARE_PROVIDER_SITE_OTHER): Payer: Medicare Other | Admitting: Pharmacist Clinician (PhC)/ Clinical Pharmacy Specialist

## 2012-09-07 VITALS — BP 122/74 | HR 72

## 2012-09-07 DIAGNOSIS — Z8679 Personal history of other diseases of the circulatory system: Secondary | ICD-10-CM

## 2012-09-07 DIAGNOSIS — Z7901 Long term (current) use of anticoagulants: Secondary | ICD-10-CM

## 2012-09-07 LAB — POCT INR: INR: 1.9

## 2012-09-10 ENCOUNTER — Ambulatory Visit: Payer: Medicare Other | Admitting: Pharmacist Clinician (PhC)/ Clinical Pharmacy Specialist

## 2012-09-14 ENCOUNTER — Encounter (INDEPENDENT_AMBULATORY_CARE_PROVIDER_SITE_OTHER): Payer: Medicare Other | Admitting: General Surgery

## 2012-09-17 ENCOUNTER — Encounter (INDEPENDENT_AMBULATORY_CARE_PROVIDER_SITE_OTHER): Payer: Self-pay | Admitting: General Surgery

## 2012-09-17 ENCOUNTER — Ambulatory Visit (INDEPENDENT_AMBULATORY_CARE_PROVIDER_SITE_OTHER): Payer: Medicare Other | Admitting: General Surgery

## 2012-09-17 VITALS — BP 140/86 | HR 46 | Resp 12 | Ht 71.0 in | Wt 150.8 lb

## 2012-09-17 DIAGNOSIS — Z9889 Other specified postprocedural states: Secondary | ICD-10-CM

## 2012-09-17 DIAGNOSIS — Z8719 Personal history of other diseases of the digestive system: Secondary | ICD-10-CM

## 2012-09-17 NOTE — Progress Notes (Signed)
Patient ID: Randy Fuller, male   DOB: Jun 10, 1933, 79 y.o.   MRN: [id number] The patient is a 79 year old male status post laparoscopic left inguinal hernia. The patient has been doing well postoperatively. His soreness is slowly getting better.  On exam: There is no hernia on palpation. Wounds are clean dry and intact  Assessment and plan: Patient is a 79 year old male status post laparoscopic left inguinal hernia repair  1.  We discussed weightlifting restrictions for another month. 2.  Patient follow up as needed

## 2012-09-30 ENCOUNTER — Ambulatory Visit: Payer: Medicare Other | Admitting: Pharmacist Clinician (PhC)/ Clinical Pharmacy Specialist

## 2012-09-30 ENCOUNTER — Ambulatory Visit (INDEPENDENT_AMBULATORY_CARE_PROVIDER_SITE_OTHER): Payer: Medicare Other | Admitting: Pharmacist Clinician (PhC)/ Clinical Pharmacy Specialist

## 2012-09-30 VITALS — BP 142/90 | HR 72

## 2012-09-30 DIAGNOSIS — Z8679 Personal history of other diseases of the circulatory system: Secondary | ICD-10-CM

## 2012-09-30 DIAGNOSIS — Z7901 Long term (current) use of anticoagulants: Secondary | ICD-10-CM

## 2012-09-30 LAB — POCT INR: INR: 2.4

## 2012-10-08 DIAGNOSIS — H3581 Retinal edema: Secondary | ICD-10-CM | POA: Insufficient documentation

## 2012-11-11 ENCOUNTER — Ambulatory Visit (INDEPENDENT_AMBULATORY_CARE_PROVIDER_SITE_OTHER): Payer: Medicare Other | Admitting: Pharmacist Clinician (PhC)/ Clinical Pharmacy Specialist

## 2012-11-11 ENCOUNTER — Ambulatory Visit: Payer: Medicare Other | Admitting: Pharmacist Clinician (PhC)/ Clinical Pharmacy Specialist

## 2012-11-11 VITALS — BP 122/64 | HR 68

## 2012-11-11 DIAGNOSIS — Z7901 Long term (current) use of anticoagulants: Secondary | ICD-10-CM

## 2012-11-11 DIAGNOSIS — Z8679 Personal history of other diseases of the circulatory system: Secondary | ICD-10-CM

## 2012-11-19 ENCOUNTER — Other Ambulatory Visit: Payer: Self-pay | Admitting: Pharmacist Clinician (PhC)/ Clinical Pharmacy Specialist

## 2012-11-19 ENCOUNTER — Other Ambulatory Visit: Payer: Self-pay | Admitting: Cardiovascular Disease

## 2012-11-19 MED ORDER — WARFARIN SODIUM 5 MG PO TABS: 5.0000 mg | ORAL_TABLET | Freq: Every day | ORAL | Status: DC

## 2012-11-19 NOTE — Telephone Encounter (Signed)
Rx was sent to pharmacy electronically. 

## 2012-11-21 ENCOUNTER — Other Ambulatory Visit: Payer: Self-pay | Admitting: Cardiovascular Disease

## 2012-11-23 ENCOUNTER — Other Ambulatory Visit: Payer: Self-pay | Admitting: *Deleted

## 2012-11-24 ENCOUNTER — Other Ambulatory Visit: Payer: Self-pay | Admitting: *Deleted

## 2012-12-23 ENCOUNTER — Ambulatory Visit (INDEPENDENT_AMBULATORY_CARE_PROVIDER_SITE_OTHER): Payer: Medicare Other | Admitting: Pharmacist Clinician (PhC)/ Clinical Pharmacy Specialist

## 2012-12-23 VITALS — BP 150/82 | HR 72

## 2012-12-23 DIAGNOSIS — Z7901 Long term (current) use of anticoagulants: Secondary | ICD-10-CM

## 2012-12-23 DIAGNOSIS — Z8679 Personal history of other diseases of the circulatory system: Secondary | ICD-10-CM

## 2012-12-23 LAB — POCT INR: INR: 2.8

## 2013-02-03 ENCOUNTER — Ambulatory Visit (INDEPENDENT_AMBULATORY_CARE_PROVIDER_SITE_OTHER): Payer: Medicare Other | Admitting: Pharmacist Clinician (PhC)/ Clinical Pharmacy Specialist

## 2013-02-03 VITALS — BP 120/64 | HR 72

## 2013-02-03 DIAGNOSIS — Z7901 Long term (current) use of anticoagulants: Secondary | ICD-10-CM

## 2013-02-03 DIAGNOSIS — Z8679 Personal history of other diseases of the circulatory system: Secondary | ICD-10-CM

## 2013-02-03 LAB — POCT INR: INR: 2.5

## 2013-03-01 ENCOUNTER — Ambulatory Visit (INDEPENDENT_AMBULATORY_CARE_PROVIDER_SITE_OTHER): Payer: Medicare Other | Admitting: Pharmacist Clinician (PhC)/ Clinical Pharmacy Specialist

## 2013-03-01 VITALS — BP 110/52 | HR 60

## 2013-03-01 DIAGNOSIS — Z8679 Personal history of other diseases of the circulatory system: Secondary | ICD-10-CM

## 2013-03-01 DIAGNOSIS — Z7901 Long term (current) use of anticoagulants: Secondary | ICD-10-CM

## 2013-03-17 ENCOUNTER — Ambulatory Visit: Payer: Medicare Other | Admitting: Pharmacist Clinician (PhC)/ Clinical Pharmacy Specialist

## 2013-03-18 ENCOUNTER — Encounter: Payer: Self-pay | Admitting: Cardiovascular Disease

## 2013-03-18 ENCOUNTER — Ambulatory Visit (INDEPENDENT_AMBULATORY_CARE_PROVIDER_SITE_OTHER): Payer: Medicare Other | Admitting: Cardiovascular Disease

## 2013-03-18 ENCOUNTER — Ambulatory Visit: Payer: Medicare Other | Admitting: Pharmacist Clinician (PhC)/ Clinical Pharmacy Specialist

## 2013-03-18 VITALS — BP 128/64 | HR 63 | Ht 71.0 in | Wt 156.8 lb

## 2013-03-18 DIAGNOSIS — Z8679 Personal history of other diseases of the circulatory system: Secondary | ICD-10-CM

## 2013-03-18 MED ORDER — WARFARIN SODIUM 5 MG PO TABS: 5.0000 mg | ORAL_TABLET | Freq: Every day | ORAL | Status: DC

## 2013-03-18 MED ORDER — DIGOXIN 125 MCG PO TABS: 0.1250 mg | ORAL_TABLET | Freq: Every day | ORAL | Status: DC

## 2013-03-18 NOTE — Assessment & Plan Note (Signed)
Atrial fibrillation rate controlled on oral anticoagulation. We follow his INRs here in our office.

## 2013-03-18 NOTE — Progress Notes (Signed)
03/18/2013 Randy Fuller   1933/10/12  [id number]  Primary Physician Carolyne Fiscal, MD Primary Cardiologist: Runell Gess MD Roseanne Reno   HPI:  The patient is a very pleasant, 79 year old, thin-appearing, married [place] American male, father of 3, grandfather to 17 grandchildren who I saw a year ago. He has a history of paroxysmal A-fib in the past, now chronic A-fib on Coumadin anticoagulation. His other problems include hyperlipidemia. He is entirely asymptomatic. Dr. Duaine Dredge follows his lipid profile.his cholesterol was recently increased from 20-40 mg a day by Dr. Duaine Dredge. Since I saw him back one year ago he denies chest pain or shortness of breath.  Current Outpatient Prescriptions  Medication Sig Dispense Refill  . aspirin 81 MG chewable tablet Chew 81 mg by mouth daily.      . betamethasone valerate lotion (VALISONE) 0.1 % Apply 1 application topically at bedtime as needed (DRY SCALP).       . Cholecalciferol 1000 UNITS capsule Take 1,000 Units by mouth daily.      [name] DIGOX 125 MCG tablet Take 1 tablet by mouth  daily  90 tablet  2  . dorzolamide-timolol (COSOPT) 22.3-6.8 MG/ML ophthalmic solution Place 1 drop into both eyes 2 (two) times daily.       . finasteride (PROSCAR) 5 MG tablet Take 5 mg by mouth daily.      [name] latanoprost (XALATAN) 0.005 % ophthalmic solution Place 1 drop into both eyes daily.      . Multiple Vitamins-Minerals (CENTRUM SILVER PO) Take 1 tablet by mouth daily.       . rosuvastatin (CRESTOR) 40 MG tablet Take 40 mg by mouth daily.      . sildenafil (VIAGRA) 100 MG tablet Take 100 mg by mouth daily as needed.      . Tamsulosin HCl (FLOMAX) 0.4 MG CAPS Take 0.4 mg by mouth.      . triamcinolone ointment (KENALOG) 0.1 % Apply 1 application topically 2 (two) times daily as needed (DRY SKIN).       [name] warfarin (COUMADIN) 5 MG tablet Take 1 tablet (5 mg total) by mouth daily.  90 tablet  2   No current facility-administered  medications for this visit.    Allergies  Allergen Reactions  . Pork-Derived Products Swelling    History   Social History  . Marital Status: Married    Spouse Name: N/A    Number of Children: N/A  . Years of Education: N/A   Occupational History  . machinist retired    Social History Main Topics  . Smoking status: Never Smoker   . Smokeless tobacco: Never Used  . Alcohol Use: No  . Drug Use: No  . Sexual Activity: Not on file   Other Topics Concern  . Not on file   Social History Narrative  . No narrative on file     Review of Systems: General: negative for chills, fever, night sweats or weight changes.  Cardiovascular: negative for chest pain, dyspnea on exertion, edema, orthopnea, palpitations, paroxysmal nocturnal dyspnea or shortness of breath Dermatological: negative for rash Respiratory: negative for cough or wheezing Urologic: negative for hematuria Abdominal: negative for nausea, vomiting, diarrhea, bright red blood per rectum, melena, or hematemesis Neurologic: negative for visual changes, syncope, or dizziness All other systems reviewed and are otherwise negative except as noted above.    Blood pressure 128/64, pulse 63, height 5\' 11"  (1.803 m), weight 156 lb 12.8 oz (71.124 kg).  General appearance: alert  and no distress Neck: no adenopathy, no carotid bruit, no JVD, supple, symmetrical, trachea midline and thyroid not enlarged, symmetric, no tenderness/mass/nodules Lungs: clear to auscultation bilaterally Heart: irregularly irregular rhythm Extremities: extremities normal, atraumatic, no cyanosis or edema  EKG atrial fibrillation with a ventricular response of 63 and an occasional apparently conducted beats  ASSESSMENT AND PLAN:   ATRIAL FIBRILLATION, HX OF Atrial fibrillation rate controlled on oral anticoagulation. We follow his INRs here in our office.  HYPERLIPIDEMIA On statin therapy followed by his PCP. Apparently his Crestor was recently  increased from 20-40 mg a day.      Runell Gess MD FACP,FACC,FAHA, [facility] 03/18/2013 9:02 AM

## 2013-03-18 NOTE — Assessment & Plan Note (Signed)
On statin therapy followed by his PCP. Apparently his Crestor was recently increased from 20-40 mg a day.

## 2013-03-18 NOTE — Patient Instructions (Signed)
Your physician wants you to follow-up in: 1 year with Dr Berry. You will receive a reminder letter in the mail two months in advance. If you don't receive a letter, please call our office to schedule the follow-up appointment.  

## 2013-03-19 ENCOUNTER — Encounter: Payer: Self-pay | Admitting: Cardiovascular Disease

## 2013-03-29 ENCOUNTER — Ambulatory Visit (INDEPENDENT_AMBULATORY_CARE_PROVIDER_SITE_OTHER): Payer: Medicare Other | Admitting: Pharmacist Clinician (PhC)/ Clinical Pharmacy Specialist

## 2013-03-29 VITALS — BP 140/80 | HR 76

## 2013-03-29 DIAGNOSIS — Z5181 Encounter for therapeutic drug level monitoring: Secondary | ICD-10-CM

## 2013-03-29 DIAGNOSIS — Z8679 Personal history of other diseases of the circulatory system: Secondary | ICD-10-CM

## 2013-03-29 DIAGNOSIS — Z7901 Long term (current) use of anticoagulants: Secondary | ICD-10-CM

## 2013-05-10 ENCOUNTER — Ambulatory Visit (INDEPENDENT_AMBULATORY_CARE_PROVIDER_SITE_OTHER): Payer: Medicare Other | Admitting: Pharmacist Clinician (PhC)/ Clinical Pharmacy Specialist

## 2013-05-10 VITALS — BP 120/64 | HR 60

## 2013-05-10 DIAGNOSIS — Z8679 Personal history of other diseases of the circulatory system: Secondary | ICD-10-CM

## 2013-05-10 DIAGNOSIS — Z7901 Long term (current) use of anticoagulants: Secondary | ICD-10-CM

## 2013-05-10 LAB — POCT INR: INR: 1.8

## 2013-06-21 ENCOUNTER — Ambulatory Visit (INDEPENDENT_AMBULATORY_CARE_PROVIDER_SITE_OTHER): Payer: Medicare Other | Admitting: Pharmacist Clinician (PhC)/ Clinical Pharmacy Specialist

## 2013-06-21 DIAGNOSIS — Z8679 Personal history of other diseases of the circulatory system: Secondary | ICD-10-CM

## 2013-06-21 DIAGNOSIS — Z7901 Long term (current) use of anticoagulants: Secondary | ICD-10-CM

## 2013-06-21 LAB — POCT INR: INR: 4.5

## 2013-07-05 ENCOUNTER — Ambulatory Visit (INDEPENDENT_AMBULATORY_CARE_PROVIDER_SITE_OTHER): Payer: Medicare Other | Admitting: Pharmacist Clinician (PhC)/ Clinical Pharmacy Specialist

## 2013-07-05 VITALS — BP 124/62 | HR 76

## 2013-07-05 DIAGNOSIS — Z8679 Personal history of other diseases of the circulatory system: Secondary | ICD-10-CM

## 2013-07-05 DIAGNOSIS — Z7901 Long term (current) use of anticoagulants: Secondary | ICD-10-CM

## 2013-07-05 LAB — POCT INR: INR: 2.1

## 2013-08-02 ENCOUNTER — Ambulatory Visit (INDEPENDENT_AMBULATORY_CARE_PROVIDER_SITE_OTHER): Payer: Medicare Other | Admitting: Pharmacist Clinician (PhC)/ Clinical Pharmacy Specialist

## 2013-08-02 DIAGNOSIS — Z8679 Personal history of other diseases of the circulatory system: Secondary | ICD-10-CM

## 2013-08-02 DIAGNOSIS — Z7901 Long term (current) use of anticoagulants: Secondary | ICD-10-CM

## 2013-08-02 LAB — POCT INR: INR: 3

## 2013-09-13 ENCOUNTER — Ambulatory Visit (INDEPENDENT_AMBULATORY_CARE_PROVIDER_SITE_OTHER): Payer: Medicare Other | Admitting: Pharmacist Clinician (PhC)/ Clinical Pharmacy Specialist

## 2013-09-13 DIAGNOSIS — Z7901 Long term (current) use of anticoagulants: Secondary | ICD-10-CM

## 2013-09-13 DIAGNOSIS — Z8679 Personal history of other diseases of the circulatory system: Secondary | ICD-10-CM

## 2013-09-13 LAB — POCT INR: INR: 2.7

## 2013-10-25 ENCOUNTER — Ambulatory Visit (INDEPENDENT_AMBULATORY_CARE_PROVIDER_SITE_OTHER): Payer: Medicare Other | Admitting: Pharmacist Clinician (PhC)/ Clinical Pharmacy Specialist

## 2013-10-25 DIAGNOSIS — Z7901 Long term (current) use of anticoagulants: Secondary | ICD-10-CM

## 2013-10-25 DIAGNOSIS — Z8679 Personal history of other diseases of the circulatory system: Secondary | ICD-10-CM

## 2013-10-25 LAB — POCT INR: INR: 2.8

## 2013-12-07 ENCOUNTER — Ambulatory Visit: Payer: Medicare Other | Admitting: Pharmacist Clinician (PhC)/ Clinical Pharmacy Specialist

## 2013-12-08 ENCOUNTER — Ambulatory Visit (INDEPENDENT_AMBULATORY_CARE_PROVIDER_SITE_OTHER): Payer: Medicare Other | Admitting: Pharmacist Clinician (PhC)/ Clinical Pharmacy Specialist

## 2013-12-08 DIAGNOSIS — Z8679 Personal history of other diseases of the circulatory system: Secondary | ICD-10-CM

## 2013-12-08 DIAGNOSIS — Z7901 Long term (current) use of anticoagulants: Secondary | ICD-10-CM

## 2013-12-08 LAB — POCT INR: INR: 2.1

## 2014-01-06 ENCOUNTER — Other Ambulatory Visit: Payer: Self-pay | Admitting: Family Medicine

## 2014-01-06 ENCOUNTER — Ambulatory Visit
Admission: RE | Admit: 2014-01-06 | Discharge: 2014-01-06 | Disposition: A | Discharge status: Home / Self Care | Payer: Medicare Other | Source: Ambulatory Visit | Attending: Family Medicine | Admitting: Family Medicine

## 2014-01-06 DIAGNOSIS — R509 Fever, unspecified: Secondary | ICD-10-CM

## 2014-01-10 ENCOUNTER — Telehealth: Payer: Self-pay | Admitting: Pharmacist Clinician (PhC)/ Clinical Pharmacy Specialist

## 2014-01-10 NOTE — Telephone Encounter (Signed)
Has pneumonia - given two meds - Cheratussin cough syrup and inhaler, but pt had problems with urination, so these were held.  Now on levofloxacin 500mg  daily x 7 days, started on Friday.  Will have him come for INR check on Wednesday.

## 2014-01-12 ENCOUNTER — Ambulatory Visit (INDEPENDENT_AMBULATORY_CARE_PROVIDER_SITE_OTHER): Payer: Medicare Other | Admitting: Pharmacist Clinician (PhC)/ Clinical Pharmacy Specialist

## 2014-01-12 VITALS — BP 138/90 | HR 88

## 2014-01-12 DIAGNOSIS — Z7901 Long term (current) use of anticoagulants: Secondary | ICD-10-CM

## 2014-01-12 DIAGNOSIS — Z8679 Personal history of other diseases of the circulatory system: Secondary | ICD-10-CM

## 2014-01-12 LAB — POCT INR: INR: 6.8

## 2014-01-17 ENCOUNTER — Ambulatory Visit (INDEPENDENT_AMBULATORY_CARE_PROVIDER_SITE_OTHER): Payer: Medicare Other | Admitting: Pharmacist Clinician (PhC)/ Clinical Pharmacy Specialist

## 2014-01-17 DIAGNOSIS — Z7901 Long term (current) use of anticoagulants: Secondary | ICD-10-CM

## 2014-01-17 DIAGNOSIS — Z8679 Personal history of other diseases of the circulatory system: Secondary | ICD-10-CM

## 2014-01-17 LAB — POCT INR: INR: 1.5

## 2014-01-19 ENCOUNTER — Ambulatory Visit: Payer: Medicare Other | Admitting: Pharmacist Clinician (PhC)/ Clinical Pharmacy Specialist

## 2014-01-31 ENCOUNTER — Ambulatory Visit (INDEPENDENT_AMBULATORY_CARE_PROVIDER_SITE_OTHER): Payer: Medicare Other | Admitting: Pharmacist Clinician (PhC)/ Clinical Pharmacy Specialist

## 2014-01-31 DIAGNOSIS — Z7901 Long term (current) use of anticoagulants: Secondary | ICD-10-CM

## 2014-01-31 DIAGNOSIS — Z8679 Personal history of other diseases of the circulatory system: Secondary | ICD-10-CM

## 2014-01-31 LAB — POCT INR: INR: 3.1

## 2014-02-21 ENCOUNTER — Ambulatory Visit
Admission: RE | Admit: 2014-02-21 | Discharge: 2014-02-21 | Disposition: A | Discharge status: Home / Self Care | Payer: Medicare Other | Source: Ambulatory Visit | Attending: Family Medicine | Admitting: Family Medicine

## 2014-02-21 ENCOUNTER — Ambulatory Visit (INDEPENDENT_AMBULATORY_CARE_PROVIDER_SITE_OTHER): Payer: Medicare Other | Admitting: Pharmacist Clinician (PhC)/ Clinical Pharmacy Specialist

## 2014-02-21 ENCOUNTER — Other Ambulatory Visit: Payer: Self-pay | Admitting: Family Medicine

## 2014-02-21 DIAGNOSIS — J189 Pneumonia, unspecified organism: Secondary | ICD-10-CM

## 2014-02-21 DIAGNOSIS — Z7901 Long term (current) use of anticoagulants: Secondary | ICD-10-CM

## 2014-02-21 DIAGNOSIS — Z8679 Personal history of other diseases of the circulatory system: Secondary | ICD-10-CM

## 2014-02-21 LAB — POCT INR: INR: 3.1

## 2014-03-15 ENCOUNTER — Ambulatory Visit (INDEPENDENT_AMBULATORY_CARE_PROVIDER_SITE_OTHER): Payer: Medicare Other | Admitting: Pharmacist Clinician (PhC)/ Clinical Pharmacy Specialist

## 2014-03-15 ENCOUNTER — Encounter: Payer: Self-pay | Admitting: Cardiovascular Disease

## 2014-03-15 ENCOUNTER — Ambulatory Visit (INDEPENDENT_AMBULATORY_CARE_PROVIDER_SITE_OTHER): Payer: Medicare Other | Admitting: *Deleted

## 2014-03-15 ENCOUNTER — Ambulatory Visit (INDEPENDENT_AMBULATORY_CARE_PROVIDER_SITE_OTHER): Payer: Medicare Other | Admitting: Cardiovascular Disease

## 2014-03-15 VITALS — BP 120/80 | HR 62 | Ht 71.0 in | Wt 157.5 lb

## 2014-03-15 DIAGNOSIS — I4891 Unspecified atrial fibrillation: Secondary | ICD-10-CM

## 2014-03-15 DIAGNOSIS — Z8679 Personal history of other diseases of the circulatory system: Secondary | ICD-10-CM

## 2014-03-15 DIAGNOSIS — Z7901 Long term (current) use of anticoagulants: Secondary | ICD-10-CM

## 2014-03-15 LAB — POCT INR: INR: 1.8

## 2014-03-15 MED ORDER — DIGOXIN 125 MCG PO TABS: 0.1250 mg | ORAL_TABLET | Freq: Every day | ORAL | Status: DC

## 2014-03-15 MED ORDER — ROSUVASTATIN CALCIUM 40 MG PO TABS: 40.0000 mg | ORAL_TABLET | Freq: Every day | ORAL | Status: DC

## 2014-03-15 MED ORDER — WARFARIN SODIUM 5 MG PO TABS: 5.0000 mg | ORAL_TABLET | Freq: Every day | ORAL | Status: DC

## 2014-03-15 NOTE — Assessment & Plan Note (Signed)
History of chronic atrial fibrillation rate controlled on Coumadin anticoagulation followed here in our clinic.

## 2014-03-15 NOTE — Progress Notes (Signed)
03/15/2014 Randy Fuller   1933/05/02  [id number]  Primary Physician Marylene Land, MD Primary Cardiologist: Lorretta Harp MD Renae Gloss   HPI:  The patient is a very pleasant, 80 year old, thin-appearing, married [place] American male, father of 48, grandfather to 26 grandchildren who I saw a year ago. He has a history of paroxysmal A-fib in the past, now chronic A-fib on Coumadin anticoagulation. His other problems include hyperlipidemia. He is entirely asymptomatic. Dr. Sandi Mariscal follows his lipid profile.his cholesterol was increased from 20-40 mg a day by Dr. Sandi Mariscal. Since I saw him back one year ago he denies chest pain or shortness of breath.   Current Outpatient Prescriptions  Medication Sig Dispense Refill  . allopurinol (ZYLOPRIM) 300 MG tablet Take 450 mg by mouth daily.    [name] aspirin 81 MG chewable tablet Chew 81 mg by mouth daily.    . betamethasone valerate lotion (VALISONE) 0.1 % Apply 1 application topically at bedtime as needed (DRY SCALP).     . Cholecalciferol 1000 UNITS capsule Take 1,000 Units by mouth daily.    . digoxin (DIGOX) 0.125 MG tablet Take 1 tablet (0.125 mg total) by mouth daily. 90 tablet 3  . dorzolamide-timolol (COSOPT) 22.3-6.8 MG/ML ophthalmic solution Place 1 drop into both eyes 2 (two) times daily.     . finasteride (PROSCAR) 5 MG tablet Take 5 mg by mouth daily.    . fluocinonide (LIDEX) 0.05 % external solution Apply to scalp nightly    . latanoprost (XALATAN) 0.005 % ophthalmic solution Place 1 drop into both eyes daily.    . Multiple Vitamins-Minerals (CENTRUM SILVER PO) Take 1 tablet by mouth daily.     . rosuvastatin (CRESTOR) 40 MG tablet Take 1 tablet (40 mg total) by mouth daily. 90 tablet 3  . sildenafil (VIAGRA) 100 MG tablet Take 100 mg by mouth daily as needed.    . Tamsulosin HCl (FLOMAX) 0.4 MG CAPS Take 0.4 mg by mouth.    . triamcinolone ointment (KENALOG) 0.1 % Apply 1 application topically 2 (two) times  daily as needed (DRY SKIN).     [name] warfarin (COUMADIN) 5 MG tablet Take 1 tablet (5 mg total) by mouth daily. 90 tablet 3   No current facility-administered medications for this visit.    Allergies  Allergen Reactions  . Pork-Derived Products Swelling    History   Social History  . Marital Status: Married    Spouse Name: N/A    Number of Children: N/A  . Years of Education: N/A   Occupational History  . machinist retired    Social History Main Topics  . Smoking status: Never Smoker   . Smokeless tobacco: Never Used  . Alcohol Use: No  . Drug Use: No  . Sexual Activity: Not on file   Other Topics Concern  . Not on file   Social History Narrative     Review of Systems: General: negative for chills, fever, night sweats or weight changes.  Cardiovascular: negative for chest pain, dyspnea on exertion, edema, orthopnea, palpitations, paroxysmal nocturnal dyspnea or shortness of breath Dermatological: negative for rash Respiratory: negative for cough or wheezing Urologic: negative for hematuria Abdominal: negative for nausea, vomiting, diarrhea, bright red blood per rectum, melena, or hematemesis Neurologic: negative for visual changes, syncope, or dizziness All other systems reviewed and are otherwise negative except as noted above.    Blood pressure 120/80, pulse 62, height 5\' 11"  (1.803 m), weight 157 lb 8 oz (71.442 kg).  General appearance: alert and no distress Neck: no adenopathy, no carotid bruit, no JVD, supple, symmetrical, trachea midline and thyroid not enlarged, symmetric, no tenderness/mass/nodules Lungs: clear to auscultation bilaterally Heart: irregularly irregular rhythm Extremities: extremities normal, atraumatic, no cyanosis or edema  EKG atrial fibrillation with ventricular response of 62 and nonspecific ST and T-wave changes. I personally reviewed that EKG  ASSESSMENT AND PLAN:   HYPERLIPIDEMIA History of hyperlipidemia on Crestor 40 mg a day  followed by his PCP. We will obtain copies of those lab results  ATRIAL FIBRILLATION, HX OF History of chronic atrial fibrillation rate controlled on Coumadin anticoagulation followed here in our clinic.      Lorretta Harp MD FACP,FACC,FAHA, [facility] 03/15/2014 3:47 PM

## 2014-03-15 NOTE — Patient Instructions (Signed)
Your physician wants you to follow-up in 1 year with Dr. Berry. You will receive a reminder letter in the mail 2 months in advance. If you do not receive a letter, please call our office to schedule the follow-up appointment.  

## 2014-03-15 NOTE — Assessment & Plan Note (Signed)
History of hyperlipidemia on Crestor 40 mg a day followed by his PCP. We will obtain copies of those lab results

## 2014-04-06 ENCOUNTER — Ambulatory Visit: Payer: Medicare Other | Admitting: Pharmacist Clinician (PhC)/ Clinical Pharmacy Specialist

## 2014-04-11 ENCOUNTER — Ambulatory Visit (INDEPENDENT_AMBULATORY_CARE_PROVIDER_SITE_OTHER): Payer: Medicare Other | Admitting: Pharmacist Clinician (PhC)/ Clinical Pharmacy Specialist

## 2014-04-11 DIAGNOSIS — Z7901 Long term (current) use of anticoagulants: Secondary | ICD-10-CM

## 2014-04-11 DIAGNOSIS — Z8679 Personal history of other diseases of the circulatory system: Secondary | ICD-10-CM

## 2014-04-11 LAB — POCT INR: INR: 2.6

## 2014-05-09 ENCOUNTER — Ambulatory Visit (INDEPENDENT_AMBULATORY_CARE_PROVIDER_SITE_OTHER): Payer: Medicare Other | Admitting: Pharmacist Clinician (PhC)/ Clinical Pharmacy Specialist

## 2014-05-09 DIAGNOSIS — Z8679 Personal history of other diseases of the circulatory system: Secondary | ICD-10-CM

## 2014-05-09 DIAGNOSIS — Z7901 Long term (current) use of anticoagulants: Secondary | ICD-10-CM

## 2014-05-09 LAB — POCT INR: INR: 3.1

## 2014-06-06 ENCOUNTER — Ambulatory Visit (INDEPENDENT_AMBULATORY_CARE_PROVIDER_SITE_OTHER): Payer: Medicare Other | Admitting: Pharmacist Clinician (PhC)/ Clinical Pharmacy Specialist

## 2014-06-06 DIAGNOSIS — Z8679 Personal history of other diseases of the circulatory system: Secondary | ICD-10-CM | POA: Diagnosis not present

## 2014-06-06 DIAGNOSIS — Z7901 Long term (current) use of anticoagulants: Secondary | ICD-10-CM | POA: Diagnosis not present

## 2014-06-06 LAB — POCT INR: INR: 2.9

## 2014-07-18 ENCOUNTER — Ambulatory Visit (INDEPENDENT_AMBULATORY_CARE_PROVIDER_SITE_OTHER): Payer: Medicare Other | Admitting: Pharmacist Clinician (PhC)/ Clinical Pharmacy Specialist

## 2014-07-18 DIAGNOSIS — Z7901 Long term (current) use of anticoagulants: Secondary | ICD-10-CM

## 2014-07-18 DIAGNOSIS — Z8679 Personal history of other diseases of the circulatory system: Secondary | ICD-10-CM

## 2014-07-18 LAB — POCT INR: INR: 2.5

## 2014-07-19 ENCOUNTER — Encounter: Payer: Self-pay | Admitting: *Deleted

## 2014-08-24 ENCOUNTER — Ambulatory Visit: Payer: Medicare Other | Admitting: Pharmacist Clinician (PhC)/ Clinical Pharmacy Specialist

## 2014-08-25 ENCOUNTER — Ambulatory Visit (INDEPENDENT_AMBULATORY_CARE_PROVIDER_SITE_OTHER): Payer: Medicare Other | Admitting: Pharmacist Clinician (PhC)/ Clinical Pharmacy Specialist

## 2014-08-25 DIAGNOSIS — Z7901 Long term (current) use of anticoagulants: Secondary | ICD-10-CM

## 2014-08-25 DIAGNOSIS — Z8679 Personal history of other diseases of the circulatory system: Secondary | ICD-10-CM

## 2014-08-25 LAB — POCT INR: INR: 2.8

## 2014-09-01 ENCOUNTER — Other Ambulatory Visit: Payer: Self-pay | Admitting: Family Medicine

## 2014-09-01 DIAGNOSIS — R748 Abnormal levels of other serum enzymes: Secondary | ICD-10-CM

## 2014-09-26 ENCOUNTER — Ambulatory Visit
Admission: RE | Admit: 2014-09-26 | Discharge: 2014-09-26 | Disposition: A | Discharge status: Home / Self Care | Payer: Medicare Other | Source: Ambulatory Visit | Attending: Family Medicine | Admitting: Family Medicine

## 2014-09-26 DIAGNOSIS — R748 Abnormal levels of other serum enzymes: Secondary | ICD-10-CM

## 2014-10-05 ENCOUNTER — Ambulatory Visit (INDEPENDENT_AMBULATORY_CARE_PROVIDER_SITE_OTHER): Payer: Medicare Other | Admitting: Pharmacist Clinician (PhC)/ Clinical Pharmacy Specialist

## 2014-10-05 DIAGNOSIS — Z8679 Personal history of other diseases of the circulatory system: Secondary | ICD-10-CM

## 2014-10-05 DIAGNOSIS — Z7901 Long term (current) use of anticoagulants: Secondary | ICD-10-CM | POA: Diagnosis not present

## 2014-10-05 LAB — POCT INR: INR: 2.5

## 2014-10-13 DIAGNOSIS — K222 Esophageal obstruction: Secondary | ICD-10-CM | POA: Insufficient documentation

## 2014-10-18 ENCOUNTER — Encounter: Payer: Self-pay | Admitting: Internal Medicine

## 2014-10-18 ENCOUNTER — Other Ambulatory Visit (INDEPENDENT_AMBULATORY_CARE_PROVIDER_SITE_OTHER): Payer: Medicare Other

## 2014-10-18 ENCOUNTER — Ambulatory Visit (INDEPENDENT_AMBULATORY_CARE_PROVIDER_SITE_OTHER): Payer: Medicare Other | Admitting: Internal Medicine

## 2014-10-18 VITALS — BP 120/62 | HR 68 | Ht 71.0 in | Wt 163.6 lb

## 2014-10-18 DIAGNOSIS — K2100 Gastro-esophageal reflux disease with esophagitis, without bleeding: Secondary | ICD-10-CM

## 2014-10-18 DIAGNOSIS — R945 Abnormal results of liver function studies: Principal | ICD-10-CM

## 2014-10-18 DIAGNOSIS — R7989 Other specified abnormal findings of blood chemistry: Secondary | ICD-10-CM

## 2014-10-18 DIAGNOSIS — R932 Abnormal findings on diagnostic imaging of liver and biliary tract: Secondary | ICD-10-CM

## 2014-10-18 LAB — CBC WITH DIFFERENTIAL/PLATELET
BASOS ABS: 0 10*3/uL (ref 0.0–0.1)
Basophils Relative: 0.6 % (ref 0.0–3.0)
EOS ABS: 0.1 10*3/uL (ref 0.0–0.7)
Eosinophils Relative: 1.7 % (ref 0.0–5.0)
HCT: 41.9 % (ref 39.0–52.0)
HEMOGLOBIN: 13.7 g/dL (ref 13.0–17.0)
LYMPHS ABS: 1.7 10*3/uL (ref 0.7–4.0)
Lymphocytes Relative: 25.4 % (ref 12.0–46.0)
MCHC: 32.8 g/dL (ref 30.0–36.0)
MCV: 91.9 fl (ref 78.0–100.0)
MONOS PCT: 8.5 % (ref 3.0–12.0)
Monocytes Absolute: 0.6 10*3/uL (ref 0.1–1.0)
Neutro Abs: 4.2 10*3/uL (ref 1.4–7.7)
Neutrophils Relative %: 63.8 % (ref 43.0–77.0)
PLATELETS: 139 10*3/uL — AB (ref 150.0–400.0)
RBC: 4.55 Mil/uL (ref 4.22–5.81)
RDW: 16.7 % — ABNORMAL HIGH (ref 11.5–15.5)
WBC: 6.6 10*3/uL (ref 4.0–10.5)

## 2014-10-18 LAB — HEPATIC FUNCTION PANEL
ALT: 36 U/L (ref 0–53)
AST: 35 U/L (ref 0–37)
Albumin: 3.6 g/dL (ref 3.5–5.2)
Alkaline Phosphatase: 89 U/L (ref 39–117)
BILIRUBIN DIRECT: 0.1 mg/dL (ref 0.0–0.3)
Total Bilirubin: 0.7 mg/dL (ref 0.2–1.2)
Total Protein: 6.4 g/dL (ref 6.0–8.3)

## 2014-10-18 NOTE — Patient Instructions (Signed)
Your physician has requested that you go to the basement for lab work before leaving today   Please follow up with Dr. Henrene Pastor on 12/09/2014 at 2:15pm

## 2014-10-18 NOTE — Progress Notes (Signed)
HISTORY OF PRESENT ILLNESS:  Randy Fuller is a 81 y.o. male with hyperlipidemia, BPH, chronic atrial fibrillation on Coumadin, gout on allopurinol, and a history of adenomatous colon polyps. He was last evaluated in this office December 2013 regarding surveillance colonoscopy and new-onset dysphagia. He subsequently underwent colonoscopy and upper endoscopy 04/08/2012. Colonoscopy revealed moderate diverticulosis in the sigmoid colon but was otherwise normal. Upper endoscopy revealed mild distal esophagitis with edema and antral/duodenal erosions. The esophagus was dilated and he was prescribed omeprazole 20 mg daily which he is no longer taking. He is sent today by his primary care provider Dr. Derinda Fuller with a chief complaint of elevated liver tests and abnormal gallbladder imaging. I have reviewed a cover letter as well as some outside office notes and outside laboratories. In summary, the patient has had intermittently elevated hepatic transaminases since November 2014. Crestor was stopped and his liver enzymes improved. Crestor was resumed and liver tests were said to be normal since March 2015. Most recent laboratories from 09/02/2014 revealed mild elevation of hepatic transaminases (approximately 1.5 times the upper limit of normal) with normal alkaline phosphatase, bilirubin, protein, and albumen. His prothrombin times have been stable. He continues on Crestor. He is also been on allopurinol long-term. He denies a personal or family history no liver problems. Does not use alcohol. No history of transfusion. He is accompanied today by his wife. Abdominal ultrasound was performed 09/26/2014. This was said to show "evidence of inflammatory process such as cholesterolosis or adenomyomatosis. Specifically, the gallbladder wall is mildly thickened.". The patient denies any problems with abdominal pain, intermittent or otherwise. No fevers. No weight loss. He states that he feels well. No myalgias.  Some knee pain.  REVIEW OF SYSTEMS:  All non-GI ROS negative except for you pain, irregular heartbeat, slow urine stream, arthritis  Past Medical History  Diagnosis Date  . Diverticulosis   . Internal hemorrhoids   . Cataract   . Irregular heart beat   . Anemia   . Benign prostate hyperplasia   . Hyperlipidemia     takes Atorvastatin every other day  . Gout     just stopped medication this week per Medical Md(was on Allopurinol)  . Hernia   . Glaucoma   . Atrial fibrillation     on Coumadin but on hold for surgery bridging with lovenox  . Atrial fibrillation     also takes Digoxin daily  . Arthritis   . Bruises easily     d/t being on Coumadin  . GERD (gastroesophageal reflux disease)     takes Prilosec daily  . Enlarged prostate     takes Flomax daily  . Nocturia   . Dry skin     uses Kenalog cream as needed  . Bilateral pneumonia   . ED (erectile dysfunction)   . Colon polyps     2003  . Esophageal stricture   . Esophagitis   . Elevated LFTs   . Vitamin D deficiency   . Impaired glucose tolerance   . Hearing loss   . Fatty liver     Past Surgical History  Procedure Laterality Date  . Cataract extraction      Both eyes  . Eye surgery Left 2010    glaucoma  . Eye surgery Bilateral 2013    cataract extractions and lens implants  . Inguinal hernia repair Left 08/31/2012    Procedure: LAPAROSCOPIC INGUINAL HERNIA;  Surgeon: Randy Ok, MD;  Location: [facility];  Service: General;  Laterality: Left;  . Insertion of mesh Left 08/31/2012    Procedure: INSERTION OF MESH;  Surgeon: Randy Ok, MD;  Location: [facility];  Service: General;  Laterality: Left;  . Hernia repair Left 08/31/12    Left Inguinal Hernia Repair  . Doppler echocardiography  2003  . Nm myoview ltd  2003    Social History Randy Fuller  reports that he has never smoked. He has never used smokeless tobacco. He reports that he does not drink alcohol or use illicit drugs.  family history  includes Cancer in his sister and another family member; Cirrhosis in his brother; Heart disease in his brother and sister.  Allergies  Allergen Reactions  . Pork-Derived Products Swelling       PHYSICAL EXAMINATION: Vital signs: BP 120/62 mmHg  Pulse 68  Ht 5\' 11"  (1.803 m)  Wt 163 lb 9.6 oz (74.208 kg)  BMI 22.83 kg/m2  Constitutional: generally well-appearing, no acute distress Psychiatric: alert and oriented x3, cooperative Eyes: extraocular movements intact, anicteric, conjunctiva pink Mouth: oral pharynx moist, no lesions Neck: supple no lymphadenopathy Cardiovascular: heart irregular rate and rhythm, no murmur Lungs: clear to auscultation bilaterally Abdomen: soft, nontender, nondistended, no obvious ascites, no peritoneal signs, normal bowel sounds, no organomegaly Rectal: Omitted Extremities: no clubbing cyanosis or lower extremity edema bilaterally Skin: no lesions on visible extremities Neuro: No focal deficits. No asterixis.  ASSESSMENT:  #1. Mild intermittent elevation of transaminases as described. There is no evidence for clinically significant liver disease. Most likely cause is medication related. Most likely medications would be his statin or allopurinol. Other causes of chronic elevation of liver tests will be explored. As well, muscle enzymes will be checked #2. Abnormal gallbladder imaging as described. Nonspecific. Does not correlate clinically. Would not pursue further at this point. #3. History of GERD with dysphagia. Prior esophageal dilation. No longer on PPI #4. History of adenomatous colon polyps. Has completed surveillance based on age. Last examination negative for neoplasia  PLAN:  #1. Will obtain chronic hepatitis serologies, ANA, CPK, aldolase, repeat liver tests, and tissue transglutaminase antibody #2. Office follow-up in 1 month. If liver test abnormalities persist, without obvious cause based on additional workup, would start by holding  cholesterol medicine as was done previously to see if liver test abnormalities again resolve. If not, then would hold allopurinol and observe in the same fashion  A copy of this consultation on has been sent to Dr. Sandi Fuller

## 2014-10-19 LAB — HEPATITIS C ANTIBODY: HCV AB: NEGATIVE

## 2014-10-19 LAB — HEPATITIS B SURFACE ANTIGEN: HEP B S AG: NEGATIVE

## 2014-10-19 LAB — CK TOTAL AND CKMB (NOT AT ARMC)
CK TOTAL: 230 U/L (ref 7–232)
CK, MB: 9.7 ng/mL — ABNORMAL HIGH (ref 0.0–5.0)
Relative Index: 4.2 — ABNORMAL HIGH (ref 0.0–4.0)

## 2014-10-19 LAB — HEPATITIS B SURFACE ANTIBODY,QUALITATIVE: HEP B S AB: NEGATIVE

## 2014-10-19 LAB — ANA: ANA: NEGATIVE

## 2014-10-19 LAB — MITOCHONDRIAL ANTIBODIES: MITOCHONDRIAL M2 AB, IGG: 0.14 (ref <0.91)

## 2014-10-19 LAB — TISSUE TRANSGLUTAMINASE, IGA: TISSUE TRANSGLUTAMINASE AB, IGA: 1 U/mL (ref <4)

## 2014-10-20 LAB — ALDOLASE: Aldolase: 8 U/L (ref <=8.1)

## 2014-10-21 ENCOUNTER — Telehealth: Payer: Self-pay | Admitting: Internal Medicine

## 2014-10-21 NOTE — Telephone Encounter (Signed)
Left message for patient to call back  

## 2014-10-21 NOTE — Telephone Encounter (Signed)
No he does not. Please send copies of all of his laboratories to his PCP, Dr. Derinda Late. As well, make sure the Dr. gets a copy of my recent office note. Thank you

## 2014-10-21 NOTE — Telephone Encounter (Signed)
Patient notified.  Appt for 12/06/14 cancelled

## 2014-10-21 NOTE — Telephone Encounter (Signed)
Dr. Henrene Pastor, patient asks that if his labs are normal does he need to keep follow up recommended at the office visit on 10/18/14?  Please advise Left message for patient to call back

## 2014-11-14 ENCOUNTER — Ambulatory Visit (INDEPENDENT_AMBULATORY_CARE_PROVIDER_SITE_OTHER): Payer: Medicare Other | Admitting: Pharmacist Clinician (PhC)/ Clinical Pharmacy Specialist

## 2014-11-14 DIAGNOSIS — Z7901 Long term (current) use of anticoagulants: Secondary | ICD-10-CM

## 2014-11-14 DIAGNOSIS — Z8679 Personal history of other diseases of the circulatory system: Secondary | ICD-10-CM | POA: Diagnosis not present

## 2014-11-14 LAB — POCT INR: INR: 2.8

## 2014-11-16 ENCOUNTER — Encounter: Payer: Self-pay | Admitting: Internal Medicine

## 2014-12-09 ENCOUNTER — Ambulatory Visit: Payer: Medicare Other | Admitting: Internal Medicine

## 2014-12-26 ENCOUNTER — Ambulatory Visit (INDEPENDENT_AMBULATORY_CARE_PROVIDER_SITE_OTHER): Payer: Medicare Other | Admitting: Pharmacist Clinician (PhC)/ Clinical Pharmacy Specialist

## 2014-12-26 DIAGNOSIS — Z7901 Long term (current) use of anticoagulants: Secondary | ICD-10-CM

## 2014-12-26 DIAGNOSIS — Z8679 Personal history of other diseases of the circulatory system: Secondary | ICD-10-CM

## 2014-12-26 LAB — POCT INR: INR: 2.8

## 2015-02-04 ENCOUNTER — Other Ambulatory Visit: Payer: Self-pay | Admitting: Cardiovascular Disease

## 2015-02-06 ENCOUNTER — Ambulatory Visit (INDEPENDENT_AMBULATORY_CARE_PROVIDER_SITE_OTHER): Payer: Medicare Other | Admitting: Pharmacist Clinician (PhC)/ Clinical Pharmacy Specialist

## 2015-02-06 DIAGNOSIS — Z7901 Long term (current) use of anticoagulants: Secondary | ICD-10-CM

## 2015-02-06 DIAGNOSIS — Z8679 Personal history of other diseases of the circulatory system: Secondary | ICD-10-CM

## 2015-02-06 LAB — POCT INR: INR: 2.3

## 2015-02-17 ENCOUNTER — Encounter: Payer: Self-pay | Admitting: Cardiovascular Disease

## 2015-03-15 ENCOUNTER — Ambulatory Visit (INDEPENDENT_AMBULATORY_CARE_PROVIDER_SITE_OTHER): Payer: Medicare Other | Admitting: Cardiovascular Disease

## 2015-03-15 ENCOUNTER — Ambulatory Visit (INDEPENDENT_AMBULATORY_CARE_PROVIDER_SITE_OTHER): Payer: Medicare Other | Admitting: Pharmacist Clinician (PhC)/ Clinical Pharmacy Specialist

## 2015-03-15 ENCOUNTER — Encounter: Payer: Self-pay | Admitting: Cardiovascular Disease

## 2015-03-15 VITALS — BP 138/74 | HR 50 | Ht 71.0 in | Wt 154.9 lb

## 2015-03-15 DIAGNOSIS — Z7901 Long term (current) use of anticoagulants: Secondary | ICD-10-CM

## 2015-03-15 DIAGNOSIS — Z8679 Personal history of other diseases of the circulatory system: Secondary | ICD-10-CM

## 2015-03-15 DIAGNOSIS — I4891 Unspecified atrial fibrillation: Secondary | ICD-10-CM

## 2015-03-15 LAB — POCT INR: INR: 2.3

## 2015-03-15 MED ORDER — WARFARIN SODIUM 5 MG PO TABS: 5.0000 mg | ORAL_TABLET | Freq: Once | ORAL | Status: DC

## 2015-03-15 MED ORDER — ROSUVASTATIN CALCIUM 40 MG PO TABS: 40.0000 mg | ORAL_TABLET | Freq: Every day | ORAL | Status: DC

## 2015-03-15 MED ORDER — DIGOXIN 125 MCG PO TABS: 0.1250 mg | ORAL_TABLET | Freq: Every day | ORAL | Status: DC

## 2015-03-15 NOTE — Assessment & Plan Note (Signed)
History of hyperlipidemia on statin therapy followed by his PCP 

## 2015-03-15 NOTE — Addendum Note (Signed)
Addended by: Vennie Homans on: 03/15/2015 04:58 PM   Modules accepted: Orders

## 2015-03-15 NOTE — Progress Notes (Signed)
03/15/2015 Clarke Blaker   [birth year]  [id number]  Primary Physician Marylene Land, MD Primary Cardiologist: Lorretta Harp MD Renae Gloss   HPI:   The patient is a very pleasant, 81 year old, thin-appearing, married [place] American male, father of 35, grandfather to 51 grandchildren who I saw a year ago. He has a history of paroxysmal A-fib in the past, now chronic A-fib on Coumadin anticoagulation. His other problems include hyperlipidemia. He is entirely asymptomatic. Dr. Sandi Mariscal follows his lipid profile.his cholesterol was increased from 20-40 mg a day by Dr. Sandi Mariscal. Since I saw him back one year ago he denies chest pain or shortness of breath.   Current Outpatient Prescriptions  Medication Sig Dispense Refill  . allopurinol (ZYLOPRIM) 300 MG tablet Take 450 mg by mouth daily.    [name] aspirin 81 MG chewable tablet Chew 81 mg by mouth daily.    . betamethasone valerate lotion (VALISONE) 0.1 % Apply 1 application topically at bedtime as needed (DRY SCALP).     . Cholecalciferol 1000 UNITS capsule Take 1,000 Units by mouth daily.    . digoxin (LANOXIN) 0.125 MG tablet Take 1 tablet (0.125 mg total) by mouth daily. 90 tablet 3  . dorzolamide-timolol (COSOPT) 22.3-6.8 MG/ML ophthalmic solution Place 1 drop into both eyes 2 (two) times daily.     . finasteride (PROSCAR) 5 MG tablet Take 5 mg by mouth daily.    . fluocinonide (LIDEX) 0.05 % external solution Apply to scalp nightly    . latanoprost (XALATAN) 0.005 % ophthalmic solution Place 1 drop into both eyes daily.    . Multiple Vitamins-Minerals (CENTRUM SILVER PO) Take 1 tablet by mouth daily.     . rosuvastatin (CRESTOR) 40 MG tablet Take 1 tablet (40 mg total) by mouth daily. 90 tablet 3  . sildenafil (VIAGRA) 100 MG tablet Take 100 mg by mouth daily as needed.    . Tamsulosin HCl (FLOMAX) 0.4 MG CAPS Take 0.4 mg by mouth.    . triamcinolone ointment (KENALOG) 0.1 % Apply 1 application topically 2 (two)  times daily as needed (DRY SKIN).     [name] warfarin (COUMADIN) 5 MG tablet TAKE 1 TABLET BY MOUTH  DAILY 90 tablet 1   No current facility-administered medications for this visit.    Allergies  Allergen Reactions  . Pork-Derived Products Swelling    Social History   Social History  . Marital Status: Married    Spouse Name: N/A  . Number of Children: N/A  . Years of Education: N/A   Occupational History  . machinist retired    Social History Main Topics  . Smoking status: Never Smoker   . Smokeless tobacco: Never Used  . Alcohol Use: No  . Drug Use: No  . Sexual Activity: Not on file   Other Topics Concern  . Not on file   Social History Narrative     Review of Systems: General: negative for chills, fever, night sweats or weight changes.  Cardiovascular: negative for chest pain, dyspnea on exertion, edema, orthopnea, palpitations, paroxysmal nocturnal dyspnea or shortness of breath Dermatological: negative for rash Respiratory: negative for cough or wheezing Urologic: negative for hematuria Abdominal: negative for nausea, vomiting, diarrhea, bright red blood per rectum, melena, or hematemesis Neurologic: negative for visual changes, syncope, or dizziness All other systems reviewed and are otherwise negative except as noted above.    Blood pressure 138/74, pulse 50, height 5\' 11"  (1.803 m), weight 154 lb 14.4 oz (70.262 kg).  General appearance: alert and no distress Neck: no adenopathy, no carotid bruit, no JVD, supple, symmetrical, trachea midline and thyroid not enlarged, symmetric, no tenderness/mass/nodules Lungs: clear to auscultation bilaterally Heart: irregularly irregular rhythm Extremities: extremities normal, atraumatic, no cyanosis or edema  EKG atrial fibrillation with a ventricular response of 50. I personally reviewed this EKG  ASSESSMENT AND PLAN:   HYPERLIPIDEMIA History of hyperlipidemia on statin therapy followed by his PCP  ATRIAL  FIBRILLATION, HX OF History of chronic atrial fibrillation rate controlled on Coumadin anticoagulation.      Lorretta Harp MD FACP,FACC,FAHA, [facility] 03/15/2015 2:29 PM

## 2015-03-15 NOTE — Patient Instructions (Signed)

## 2015-03-15 NOTE — Assessment & Plan Note (Signed)
History of chronic atrial fibrillation rate controlled on Coumadin anticoagulation. 

## 2015-04-26 ENCOUNTER — Ambulatory Visit (INDEPENDENT_AMBULATORY_CARE_PROVIDER_SITE_OTHER): Payer: Medicare HMO | Admitting: Pharmacist Clinician (PhC)/ Clinical Pharmacy Specialist

## 2015-04-26 DIAGNOSIS — Z8679 Personal history of other diseases of the circulatory system: Secondary | ICD-10-CM

## 2015-04-26 DIAGNOSIS — Z7901 Long term (current) use of anticoagulants: Secondary | ICD-10-CM | POA: Diagnosis not present

## 2015-04-26 LAB — POCT INR: INR: 2.1

## 2015-04-27 ENCOUNTER — Telehealth: Payer: Self-pay | Admitting: Pharmacist Clinician (PhC)/ Clinical Pharmacy Specialist

## 2015-05-03 NOTE — Telephone Encounter (Signed)
Close encounter 

## 2015-05-11 DIAGNOSIS — H3581 Retinal edema: Secondary | ICD-10-CM | POA: Diagnosis not present

## 2015-05-11 DIAGNOSIS — Z961 Presence of intraocular lens: Secondary | ICD-10-CM | POA: Diagnosis not present

## 2015-05-11 DIAGNOSIS — H34831 Tributary (branch) retinal vein occlusion, right eye, with macular edema: Secondary | ICD-10-CM | POA: Diagnosis not present

## 2015-05-11 DIAGNOSIS — H401190 Primary open-angle glaucoma, unspecified eye, stage unspecified: Secondary | ICD-10-CM | POA: Diagnosis not present

## 2015-06-06 ENCOUNTER — Ambulatory Visit (INDEPENDENT_AMBULATORY_CARE_PROVIDER_SITE_OTHER): Payer: Medicare HMO | Admitting: Pharmacist Clinician (PhC)/ Clinical Pharmacy Specialist

## 2015-06-06 DIAGNOSIS — Z7901 Long term (current) use of anticoagulants: Secondary | ICD-10-CM

## 2015-06-06 LAB — POCT INR: INR: 2.5

## 2015-06-29 DIAGNOSIS — H34831 Tributary (branch) retinal vein occlusion, right eye, with macular edema: Secondary | ICD-10-CM | POA: Diagnosis not present

## 2015-07-18 ENCOUNTER — Ambulatory Visit (INDEPENDENT_AMBULATORY_CARE_PROVIDER_SITE_OTHER): Payer: Medicare HMO | Admitting: Pharmacist Clinician (PhC)/ Clinical Pharmacy Specialist

## 2015-07-18 DIAGNOSIS — Z7901 Long term (current) use of anticoagulants: Secondary | ICD-10-CM

## 2015-07-18 DIAGNOSIS — Z8679 Personal history of other diseases of the circulatory system: Secondary | ICD-10-CM

## 2015-07-18 LAB — POCT INR: INR: 2.6

## 2015-08-17 DIAGNOSIS — H34831 Tributary (branch) retinal vein occlusion, right eye, with macular edema: Secondary | ICD-10-CM | POA: Diagnosis not present

## 2015-08-29 ENCOUNTER — Ambulatory Visit (INDEPENDENT_AMBULATORY_CARE_PROVIDER_SITE_OTHER): Payer: Medicare HMO | Admitting: Pharmacist Clinician (PhC)/ Clinical Pharmacy Specialist

## 2015-08-29 DIAGNOSIS — Z7901 Long term (current) use of anticoagulants: Secondary | ICD-10-CM

## 2015-08-29 DIAGNOSIS — Z8679 Personal history of other diseases of the circulatory system: Secondary | ICD-10-CM | POA: Diagnosis not present

## 2015-08-29 LAB — POCT INR: INR: 2

## 2015-09-01 DIAGNOSIS — Z125 Encounter for screening for malignant neoplasm of prostate: Secondary | ICD-10-CM | POA: Diagnosis not present

## 2015-09-08 DIAGNOSIS — M1A9XX0 Chronic gout, unspecified, without tophus (tophi): Secondary | ICD-10-CM | POA: Diagnosis not present

## 2015-09-08 DIAGNOSIS — B029 Zoster without complications: Secondary | ICD-10-CM | POA: Diagnosis not present

## 2015-09-08 DIAGNOSIS — Z008 Encounter for other general examination: Secondary | ICD-10-CM | POA: Diagnosis not present

## 2015-09-08 DIAGNOSIS — E7800 Pure hypercholesterolemia, unspecified: Secondary | ICD-10-CM | POA: Diagnosis not present

## 2015-09-08 DIAGNOSIS — Z23 Encounter for immunization: Secondary | ICD-10-CM | POA: Diagnosis not present

## 2015-09-08 DIAGNOSIS — R32 Unspecified urinary incontinence: Secondary | ICD-10-CM | POA: Diagnosis not present

## 2015-09-08 DIAGNOSIS — L218 Other seborrheic dermatitis: Secondary | ICD-10-CM | POA: Diagnosis not present

## 2015-09-08 DIAGNOSIS — I7300 Raynaud's syndrome without gangrene: Secondary | ICD-10-CM | POA: Diagnosis not present

## 2015-09-08 DIAGNOSIS — Z0000 Encounter for general adult medical examination without abnormal findings: Secondary | ICD-10-CM | POA: Diagnosis not present

## 2015-09-12 DIAGNOSIS — E870 Hyperosmolality and hypernatremia: Secondary | ICD-10-CM | POA: Diagnosis not present

## 2015-09-22 DIAGNOSIS — R972 Elevated prostate specific antigen [PSA]: Secondary | ICD-10-CM | POA: Diagnosis not present

## 2015-09-29 DIAGNOSIS — R350 Frequency of micturition: Secondary | ICD-10-CM | POA: Diagnosis not present

## 2015-09-29 DIAGNOSIS — N401 Enlarged prostate with lower urinary tract symptoms: Secondary | ICD-10-CM | POA: Diagnosis not present

## 2015-09-29 DIAGNOSIS — R972 Elevated prostate specific antigen [PSA]: Secondary | ICD-10-CM | POA: Diagnosis not present

## 2015-10-09 ENCOUNTER — Ambulatory Visit (INDEPENDENT_AMBULATORY_CARE_PROVIDER_SITE_OTHER): Payer: Medicare HMO | Admitting: Pharmacist

## 2015-10-09 DIAGNOSIS — Z8679 Personal history of other diseases of the circulatory system: Secondary | ICD-10-CM

## 2015-10-09 DIAGNOSIS — Z7901 Long term (current) use of anticoagulants: Secondary | ICD-10-CM | POA: Diagnosis not present

## 2015-10-09 LAB — POCT INR: INR: 2.2

## 2015-10-12 DIAGNOSIS — H34831 Tributary (branch) retinal vein occlusion, right eye, with macular edema: Secondary | ICD-10-CM | POA: Diagnosis not present

## 2015-10-31 DIAGNOSIS — B351 Tinea unguium: Secondary | ICD-10-CM | POA: Diagnosis not present

## 2015-10-31 DIAGNOSIS — L03119 Cellulitis of unspecified part of limb: Secondary | ICD-10-CM | POA: Diagnosis not present

## 2015-11-01 DIAGNOSIS — B351 Tinea unguium: Secondary | ICD-10-CM | POA: Diagnosis not present

## 2015-11-04 ENCOUNTER — Other Ambulatory Visit: Payer: Self-pay | Admitting: Cardiovascular Disease

## 2015-11-20 ENCOUNTER — Ambulatory Visit (INDEPENDENT_AMBULATORY_CARE_PROVIDER_SITE_OTHER): Payer: Medicare HMO | Admitting: Pharmacist Clinician (PhC)/ Clinical Pharmacy Specialist

## 2015-11-20 DIAGNOSIS — Z8679 Personal history of other diseases of the circulatory system: Secondary | ICD-10-CM | POA: Diagnosis not present

## 2015-11-20 DIAGNOSIS — Z7901 Long term (current) use of anticoagulants: Secondary | ICD-10-CM

## 2015-11-20 LAB — POCT INR: INR: 2.7

## 2015-12-21 DIAGNOSIS — Z961 Presence of intraocular lens: Secondary | ICD-10-CM | POA: Diagnosis not present

## 2015-12-21 DIAGNOSIS — H401190 Primary open-angle glaucoma, unspecified eye, stage unspecified: Secondary | ICD-10-CM | POA: Diagnosis not present

## 2015-12-21 DIAGNOSIS — H34831 Tributary (branch) retinal vein occlusion, right eye, with macular edema: Secondary | ICD-10-CM | POA: Diagnosis not present

## 2016-01-01 ENCOUNTER — Ambulatory Visit (INDEPENDENT_AMBULATORY_CARE_PROVIDER_SITE_OTHER): Payer: Medicare HMO | Admitting: Pharmacist Clinician (PhC)/ Clinical Pharmacy Specialist

## 2016-01-01 DIAGNOSIS — Z8679 Personal history of other diseases of the circulatory system: Secondary | ICD-10-CM

## 2016-01-01 DIAGNOSIS — Z7901 Long term (current) use of anticoagulants: Secondary | ICD-10-CM

## 2016-01-01 LAB — POCT INR: INR: 2.8

## 2016-02-12 ENCOUNTER — Ambulatory Visit (INDEPENDENT_AMBULATORY_CARE_PROVIDER_SITE_OTHER): Payer: Medicare HMO | Admitting: Pharmacist Clinician (PhC)/ Clinical Pharmacy Specialist

## 2016-02-12 DIAGNOSIS — Z8679 Personal history of other diseases of the circulatory system: Secondary | ICD-10-CM | POA: Diagnosis not present

## 2016-02-12 DIAGNOSIS — Z7901 Long term (current) use of anticoagulants: Secondary | ICD-10-CM | POA: Diagnosis not present

## 2016-02-12 LAB — POCT INR: INR: 2.5

## 2016-02-29 DIAGNOSIS — H34831 Tributary (branch) retinal vein occlusion, right eye, with macular edema: Secondary | ICD-10-CM | POA: Diagnosis not present

## 2016-02-29 DIAGNOSIS — Z961 Presence of intraocular lens: Secondary | ICD-10-CM | POA: Diagnosis not present

## 2016-02-29 DIAGNOSIS — H401190 Primary open-angle glaucoma, unspecified eye, stage unspecified: Secondary | ICD-10-CM | POA: Diagnosis not present

## 2016-03-10 ENCOUNTER — Other Ambulatory Visit: Payer: Self-pay | Admitting: Cardiovascular Disease

## 2016-03-12 ENCOUNTER — Ambulatory Visit (INDEPENDENT_AMBULATORY_CARE_PROVIDER_SITE_OTHER): Payer: Medicare HMO | Admitting: Cardiovascular Disease

## 2016-03-12 ENCOUNTER — Encounter: Payer: Self-pay | Admitting: Cardiovascular Disease

## 2016-03-12 ENCOUNTER — Ambulatory Visit (INDEPENDENT_AMBULATORY_CARE_PROVIDER_SITE_OTHER): Payer: Medicare HMO | Admitting: Pharmacist Clinician (PhC)/ Clinical Pharmacy Specialist

## 2016-03-12 VITALS — BP 148/80 | HR 55 | Ht 71.0 in | Wt 159.0 lb

## 2016-03-12 DIAGNOSIS — Z7901 Long term (current) use of anticoagulants: Secondary | ICD-10-CM

## 2016-03-12 DIAGNOSIS — I482 Chronic atrial fibrillation, unspecified: Secondary | ICD-10-CM

## 2016-03-12 DIAGNOSIS — Z8679 Personal history of other diseases of the circulatory system: Secondary | ICD-10-CM

## 2016-03-12 LAB — POCT INR: INR: 2.6

## 2016-03-12 NOTE — Progress Notes (Signed)
03/12/2016 Baer Snay   01-Oct-1933  [id number]  Primary Physician Marylene Land, MD Primary Cardiologist: Lorretta Harp MD [name], [place]  HPI:  The patient is a very pleasant, 82 year old, thin-appearing, married [place] American male, father of 23, grandfather to 83 grandchildren who I saw in the office 03/15/15. He has a history of paroxysmal A-fib in the past, now chronic A-fib on Coumadin anticoagulation. His other problems include hyperlipidemia. He is entirely asymptomatic. Dr. Sandi Mariscal follows his lipid profile.his cholesterol was increased from 20-40 mg a day by Dr. Sandi Mariscal. Since I saw him back one year ago he denies chest pain or shortness of breath.   Current Outpatient Prescriptions  Medication Sig Dispense Refill  . allopurinol (ZYLOPRIM) 300 MG tablet Take 450 mg by mouth daily.    [name] aspirin 81 MG chewable tablet Chew 81 mg by mouth daily.    . betamethasone valerate lotion (VALISONE) 0.1 % Apply 1 application topically at bedtime as needed (DRY SCALP).     . Cholecalciferol 1000 UNITS capsule Take 1,000 Units by mouth daily.    . digoxin (LANOXIN) 0.125 MG tablet TAKE 1 TABLET DAILY 90 tablet 3  . dorzolamide-timolol (COSOPT) 22.3-6.8 MG/ML ophthalmic solution Place 1 drop into both eyes 2 (two) times daily.     . finasteride (PROSCAR) 5 MG tablet Take 5 mg by mouth daily.    . fluocinonide (LIDEX) 0.05 % external solution Apply to scalp nightly    . latanoprost (XALATAN) 0.005 % ophthalmic solution Place 1 drop into both eyes daily.    . Multiple Vitamins-Minerals (CENTRUM SILVER PO) Take 1 tablet by mouth daily.     . rosuvastatin (CRESTOR) 40 MG tablet Take 1 tablet (40 mg total) by mouth daily. 90 tablet 3  . sildenafil (VIAGRA) 100 MG tablet Take 100 mg by mouth daily as needed.    . Tamsulosin HCl (FLOMAX) 0.4 MG CAPS Take 0.4 mg by mouth.    . triamcinolone ointment (KENALOG) 0.1 % Apply 1 application topically 2 (two) times daily as needed  (DRY SKIN).     [name] warfarin (COUMADIN) 5 MG tablet TAKE 1 TABLET ONE TIME ONLY AT 6 P.M. 90 tablet 1   No current facility-administered medications for this visit.     Allergies  Allergen Reactions  . Pork-Derived Products Swelling    Social History   Social History  . Marital status: Married    Spouse name: N/A  . Number of children: N/A  . Years of education: N/A   Occupational History  . machinist retired Retired   Social History Main Topics  . Smoking status: Never Smoker  . Smokeless tobacco: Never Used  . Alcohol use No  . Drug use: No  . Sexual activity: Not on file   Other Topics Concern  . Not on file   Social History Narrative  . No narrative on file     Review of Systems: General: negative for chills, fever, night sweats or weight changes.  Cardiovascular: negative for chest pain, dyspnea on exertion, edema, orthopnea, palpitations, paroxysmal nocturnal dyspnea or shortness of breath Dermatological: negative for rash Respiratory: negative for cough or wheezing Urologic: negative for hematuria Abdominal: negative for nausea, vomiting, diarrhea, bright red blood per rectum, melena, or hematemesis Neurologic: negative for visual changes, syncope, or dizziness All other systems reviewed and are otherwise negative except as noted above.    Blood pressure (!) 148/80, pulse (!) 55, height 5\' 11"  (1.803 m), weight 159  lb (72.1 kg).  General appearance: alert and no distress Neck: no adenopathy, no carotid bruit, no JVD, supple, symmetrical, trachea midline and thyroid not enlarged, symmetric, no tenderness/mass/nodules Lungs: clear to auscultation bilaterally Heart: irregularly irregular rhythm Extremities: extremities normal, atraumatic, no cyanosis or edema  EKG atrial fibrillation with a ventricular response of 55 and nonspecific ST and T-wave changes. I personally reviewed his EKG  ASSESSMENT AND PLAN:   HYPERLIPIDEMIA History of hyperlipidemia on  statin therapy followed by his PCP  ATRIAL FIBRILLATION, HX OF History of chronic atrial fibrillation rate controlled on Coumadin anticoagulation      Lorretta Harp MD [facility], [facility] 03/12/2016 10:37 AM

## 2016-03-12 NOTE — Assessment & Plan Note (Signed)
History of hyperlipidemia on statin therapy followed by his PCP 

## 2016-03-12 NOTE — Patient Instructions (Signed)

## 2016-03-12 NOTE — Assessment & Plan Note (Signed)
History of chronic atrial fibrillation rate controlled on Coumadin anticoagulation. 

## 2016-03-29 DIAGNOSIS — R972 Elevated prostate specific antigen [PSA]: Secondary | ICD-10-CM | POA: Diagnosis not present

## 2016-04-05 DIAGNOSIS — R351 Nocturia: Secondary | ICD-10-CM | POA: Diagnosis not present

## 2016-04-05 DIAGNOSIS — R972 Elevated prostate specific antigen [PSA]: Secondary | ICD-10-CM | POA: Diagnosis not present

## 2016-04-05 DIAGNOSIS — N401 Enlarged prostate with lower urinary tract symptoms: Secondary | ICD-10-CM | POA: Diagnosis not present

## 2016-04-13 ENCOUNTER — Ambulatory Visit (INDEPENDENT_AMBULATORY_CARE_PROVIDER_SITE_OTHER): Payer: Medicare HMO

## 2016-04-13 ENCOUNTER — Ambulatory Visit (HOSPITAL_COMMUNITY)
Admission: EM | Admit: 2016-04-13 | Discharge: 2016-04-13 | Disposition: A | Discharge status: Home / Self Care | Payer: Medicare HMO | Attending: Internal Medicine | Admitting: Internal Medicine

## 2016-04-13 ENCOUNTER — Encounter (HOSPITAL_COMMUNITY): Payer: Self-pay | Admitting: Emergency Medicine

## 2016-04-13 DIAGNOSIS — R69 Illness, unspecified: Secondary | ICD-10-CM | POA: Diagnosis not present

## 2016-04-13 DIAGNOSIS — R05 Cough: Secondary | ICD-10-CM | POA: Diagnosis not present

## 2016-04-13 DIAGNOSIS — J111 Influenza due to unidentified influenza virus with other respiratory manifestations: Secondary | ICD-10-CM

## 2016-04-13 MED ORDER — BENZONATATE 100 MG PO CAPS: 100.0000 mg | ORAL_CAPSULE | Freq: Three times a day (TID) | ORAL | 0 refills | Status: DC

## 2016-04-13 MED ORDER — OSELTAMIVIR PHOSPHATE 75 MG PO CAPS: 75.0000 mg | ORAL_CAPSULE | Freq: Two times a day (BID) | ORAL | 0 refills | Status: DC

## 2016-04-13 MED ORDER — ACETAMINOPHEN 325 MG PO TABS
ORAL_TABLET | ORAL | Status: AC
  Filled 2016-04-13: qty 2

## 2016-04-13 MED ORDER — ACETAMINOPHEN 325 MG PO TABS
650.0000 mg | ORAL_TABLET | Freq: Once | ORAL | Status: AC
  Administered 2016-04-13: 650 mg via ORAL

## 2016-04-13 NOTE — ED Provider Notes (Signed)
CSN: [id number]     Arrival date & time 04/13/16  82 History   First MD Initiated Contact with Patient 04/13/16 2026     Chief Complaint  Patient presents with  . Cough   (Consider location/radiation/quality/duration/timing/severity/associated sxs/prior Treatment) 83 year old male presents to clinic with chief complaint of fatigue, body aches, muscle aches, cough, congestion, and fever. Symptoms started this morning. He has little appetite but has been drinking some water. He has no nausea, vomiting, or diarrhea. He does not smoke, denies history of lung disease.    The history is provided by the patient.  Cough  Associated symptoms: chills, fever, myalgias and rhinorrhea     Past Medical History:  Diagnosis Date  . Anemia   . Arthritis   . Atrial fibrillation ([place])    on Coumadin but on hold for surgery bridging with lovenox  . Atrial fibrillation ([place])    also takes Digoxin daily  . Benign prostate hyperplasia   . Bilateral pneumonia   . Bruises easily    d/t being on Coumadin  . Cataract   . Colon polyps    2003  . Diverticulosis   . Dry skin    uses Kenalog cream as needed  . ED (erectile dysfunction)   . Elevated LFTs   . Enlarged prostate    takes Flomax daily  . Esophageal stricture   . Esophagitis   . Fatty liver   . GERD (gastroesophageal reflux disease)    takes Prilosec daily  . Glaucoma   . Gout    just stopped medication this week per Medical Md(was on Allopurinol)  . Hearing loss   . Hernia   . Hyperlipidemia    takes Atorvastatin every other day  . Impaired glucose tolerance   . Internal hemorrhoids   . Irregular heart beat   . Nocturia   . Vitamin D deficiency    Past Surgical History:  Procedure Laterality Date  . CATARACT EXTRACTION     Both eyes  . DOPPLER ECHOCARDIOGRAPHY  2003  . EYE SURGERY Left 2010   glaucoma  . EYE SURGERY Bilateral 2013   cataract extractions and lens implants  . HERNIA REPAIR Left 08/31/12   Left Inguinal  Hernia Repair  . INGUINAL HERNIA REPAIR Left 08/31/2012   Procedure: LAPAROSCOPIC INGUINAL HERNIA;  Surgeon: Ralene Ok, MD;  Location: [facility];  Service: General;  Laterality: Left;  . INSERTION OF MESH Left 08/31/2012   Procedure: INSERTION OF MESH;  Surgeon: Ralene Ok, MD;  Location: [place];  Service: General;  Laterality: Left;  . NM MYOVIEW LTD  2003   Family History  Problem Relation Age of Onset  . Heart disease Sister   . Heart disease Brother   . Cirrhosis Brother   . Cancer Sister   . Cancer      Sister/ not sure what kind   Social History  Substance Use Topics  . Smoking status: Never Smoker  . Smokeless tobacco: Never Used  . Alcohol use No   OB History    No data available     Review of Systems  Constitutional: Positive for appetite change, chills, fatigue and fever.  HENT: Positive for congestion, postnasal drip and rhinorrhea. Negative for sinus pain and sinus pressure.   Eyes: Negative.   Respiratory: Positive for cough.   Cardiovascular: Negative.   Gastrointestinal: Negative.   Genitourinary: Negative.   Musculoskeletal: Positive for myalgias.  Neurological: Negative.     Allergies  Pork-derived products  Home Medications   Prior to Admission medications   Medication Sig Start Date End Date Taking? Authorizing Provider  allopurinol (ZYLOPRIM) 300 MG tablet Take 450 mg by mouth daily. 11/08/13   Historical Provider, MD  aspirin 81 MG chewable tablet Chew 81 mg by mouth daily.    Historical Provider, MD  benzonatate (TESSALON) 100 MG capsule Take 1 capsule (100 mg total) by mouth every 8 (eight) hours. 04/13/16   Barnet Glasgow, NP  betamethasone valerate lotion (VALISONE) 0.1 % Apply 1 application topically at bedtime as needed (DRY SCALP).     Historical Provider, MD  Cholecalciferol 1000 UNITS capsule Take 1,000 Units by mouth daily.    Historical Provider, MD  digoxin (LANOXIN) 0.125 MG tablet TAKE 1 TABLET DAILY 03/11/16   Lorretta Harp,  MD  dorzolamide-timolol (COSOPT) 22.3-6.8 MG/ML ophthalmic solution Place 1 drop into both eyes 2 (two) times daily.  02/13/12   Historical Provider, MD  finasteride (PROSCAR) 5 MG tablet Take 5 mg by mouth daily.    Historical Provider, MD  fluocinonide (LIDEX) 0.05 % external solution Apply to scalp nightly 11/13/13   Historical Provider, MD  latanoprost (XALATAN) 0.005 % ophthalmic solution Place 1 drop into both eyes daily.    Historical Provider, MD  Multiple Vitamins-Minerals (CENTRUM SILVER PO) Take 1 tablet by mouth daily.     Historical Provider, MD  oseltamivir (TAMIFLU) 75 MG capsule Take 1 capsule (75 mg total) by mouth every 12 (twelve) hours. 04/13/16   Barnet Glasgow, NP  rosuvastatin (CRESTOR) 40 MG tablet Take 1 tablet (40 mg total) by mouth daily. 03/15/15   Lorretta Harp, MD  sildenafil (VIAGRA) 100 MG tablet Take 100 mg by mouth daily as needed.    Historical Provider, MD  Tamsulosin HCl (FLOMAX) 0.4 MG CAPS Take 0.4 mg by mouth.    Historical Provider, MD  triamcinolone ointment (KENALOG) 0.1 % Apply 1 application topically 2 (two) times daily as needed (DRY SKIN).     Historical Provider, MD  warfarin (COUMADIN) 5 MG tablet TAKE 1 TABLET ONE TIME ONLY AT 6 P.M. 11/06/15   Lorretta Harp, MD   Meds Ordered and Administered this Visit   Medications  acetaminophen (TYLENOL) tablet 650 mg (650 mg Oral Given 04/13/16 1958)    BP 158/85 (BP Location: Right Arm)   Pulse 86   Temp 102.9 F (39.4 C)   Resp 18   SpO2 98%  No data found.   Physical Exam  Constitutional: He is oriented to person, place, and time. He appears well-developed and well-nourished. He appears ill. No distress.  HENT:  Head: Normocephalic.  Right Ear: Tympanic membrane and external ear normal.  Left Ear: Tympanic membrane and external ear normal.  Nose: Nose normal.  Mouth/Throat: Oropharynx is clear and moist. No oropharyngeal exudate.  Neck: No JVD present.  Cardiovascular: Normal rate and  regular rhythm.   Pulmonary/Chest: Effort normal and breath sounds normal.  Abdominal: Soft. Bowel sounds are normal.  Lymphadenopathy:    He has no cervical adenopathy.  Neurological: He is alert and oriented to person, place, and time.  Skin: Skin is warm and dry. Capillary refill takes less than 2 seconds. He is not diaphoretic.  Nursing note and vitals reviewed.   Urgent Care Course   Clinical Course     Procedures (including critical care time)  Labs Review Labs Reviewed - No data to display  Imaging Review Dg Chest 2 View  Result Date: 04/13/2016 CLINICAL DATA:  Nonproductive cough beginning yesterday, fever, history atrial fibrillation, gout, hyperlipidemia EXAM: CHEST  2 VIEW COMPARISON:  02/21/2014 FINDINGS: Upper normal heart size. Atherosclerotic calcification aorta. Mediastinal contours and pulmonary vascularity normal. Lungs clear. No pleural effusion or pneumothorax. Bones unremarkable. IMPRESSION: No acute abnormalities. Aortic atherosclerosis. Electronically Signed   By: Lavonia Dana M.D.   On: 04/13/2016 20:16     Visual Acuity Review  Right Eye Distance:   Left Eye Distance:   Bilateral Distance:    Right Eye Near:   Left Eye Near:    Bilateral Near:         MDM   1. Influenza-like illness   Your chest xrays do not show any consolidation, edema, or other signs of pneumonia.   You most likely have influenza, I advise rest, plenty of fluids and and starting Tamiflu, take 1 tablet twice a day for 5 days. I am also writing a medicine for cough called tessalon. Take 1 tablet three times a day as needed.  I recommend management of symptoms with over the counter medicines. For symptoms you may take Tylenol as needed every 4-6 hours for body aches or fever, not to exceed 4,000 mg a day, Take mucinex or mucinex DM ever 12 hours with a full glass of water, you may use an inhaled steroid such as Flonase, 2 sprays each nostril once a day for congestion.  Should your  symptoms worsen or fail to resolve, follow up with your primary care provider or return to clinic.       Barnet Glasgow, NP 04/13/16 2119

## 2016-04-13 NOTE — Discharge Instructions (Signed)
Your chest xrays do not show any consolidation, edema, or other signs of pneumonia.   You most likely have influenza, I advise rest, plenty of fluids and and starting Tamiflu, take 1 tablet twice a day for 5 days. I am also writing a medicine for cough called tessalon. Take 1 tablet three times a day as needed.  I recommend management of symptoms with over the counter medicines. For symptoms you may take Tylenol as needed every 4-6 hours for body aches or fever, not to exceed 4,000 mg a day, Take mucinex or mucinex DM ever 12 hours with a full glass of water, you may use an inhaled steroid such as Flonase, 2 sprays each nostril once a day for congestion.  Should your symptoms worsen or fail to resolve, follow up with your primary care provider or return to clinic.

## 2016-04-13 NOTE — ED Triage Notes (Signed)
The patient presented to the [facility] with a complaint of a cough and weakness that started yesterday.

## 2016-04-15 DIAGNOSIS — J209 Acute bronchitis, unspecified: Secondary | ICD-10-CM | POA: Diagnosis not present

## 2016-04-24 ENCOUNTER — Ambulatory Visit (INDEPENDENT_AMBULATORY_CARE_PROVIDER_SITE_OTHER): Payer: Medicare HMO | Admitting: Pharmacist

## 2016-04-24 DIAGNOSIS — Z8679 Personal history of other diseases of the circulatory system: Secondary | ICD-10-CM

## 2016-04-24 DIAGNOSIS — Z7901 Long term (current) use of anticoagulants: Secondary | ICD-10-CM | POA: Diagnosis not present

## 2016-04-24 LAB — POCT INR: INR: 3

## 2016-05-02 DIAGNOSIS — H34831 Tributary (branch) retinal vein occlusion, right eye, with macular edema: Secondary | ICD-10-CM | POA: Diagnosis not present

## 2016-05-04 ENCOUNTER — Other Ambulatory Visit: Payer: Self-pay | Admitting: Cardiovascular Disease

## 2016-06-05 ENCOUNTER — Ambulatory Visit (INDEPENDENT_AMBULATORY_CARE_PROVIDER_SITE_OTHER): Payer: Medicare HMO | Admitting: Pharmacist

## 2016-06-05 DIAGNOSIS — Z7901 Long term (current) use of anticoagulants: Secondary | ICD-10-CM | POA: Diagnosis not present

## 2016-06-05 DIAGNOSIS — Z8679 Personal history of other diseases of the circulatory system: Secondary | ICD-10-CM | POA: Diagnosis not present

## 2016-06-05 LAB — POCT INR: INR: 2.2

## 2016-06-13 DIAGNOSIS — H5213 Myopia, bilateral: Secondary | ICD-10-CM | POA: Diagnosis not present

## 2016-06-13 DIAGNOSIS — H524 Presbyopia: Secondary | ICD-10-CM | POA: Diagnosis not present

## 2016-06-13 DIAGNOSIS — Z0100 Encounter for examination of eyes and vision without abnormal findings: Secondary | ICD-10-CM | POA: Diagnosis not present

## 2016-06-13 DIAGNOSIS — H52223 Regular astigmatism, bilateral: Secondary | ICD-10-CM | POA: Diagnosis not present

## 2016-06-13 DIAGNOSIS — H401132 Primary open-angle glaucoma, bilateral, moderate stage: Secondary | ICD-10-CM | POA: Diagnosis not present

## 2016-06-25 DIAGNOSIS — G609 Hereditary and idiopathic neuropathy, unspecified: Secondary | ICD-10-CM | POA: Diagnosis not present

## 2016-06-25 DIAGNOSIS — L03119 Cellulitis of unspecified part of limb: Secondary | ICD-10-CM | POA: Diagnosis not present

## 2016-06-25 DIAGNOSIS — M1A9XX0 Chronic gout, unspecified, without tophus (tophi): Secondary | ICD-10-CM | POA: Diagnosis not present

## 2016-06-26 DIAGNOSIS — R7302 Impaired glucose tolerance (oral): Secondary | ICD-10-CM | POA: Diagnosis not present

## 2016-06-26 DIAGNOSIS — G9009 Other idiopathic peripheral autonomic neuropathy: Secondary | ICD-10-CM | POA: Diagnosis not present

## 2016-06-26 DIAGNOSIS — M109 Gout, unspecified: Secondary | ICD-10-CM | POA: Diagnosis not present

## 2016-06-27 DIAGNOSIS — H34831 Tributary (branch) retinal vein occlusion, right eye, with macular edema: Secondary | ICD-10-CM | POA: Diagnosis not present

## 2016-07-17 ENCOUNTER — Ambulatory Visit (INDEPENDENT_AMBULATORY_CARE_PROVIDER_SITE_OTHER): Payer: Medicare HMO | Admitting: Pharmacist

## 2016-07-17 DIAGNOSIS — Z8679 Personal history of other diseases of the circulatory system: Secondary | ICD-10-CM

## 2016-07-17 DIAGNOSIS — Z7901 Long term (current) use of anticoagulants: Secondary | ICD-10-CM

## 2016-07-17 LAB — POCT INR: INR: 2

## 2016-08-22 DIAGNOSIS — H34831 Tributary (branch) retinal vein occlusion, right eye, with macular edema: Secondary | ICD-10-CM | POA: Diagnosis not present

## 2016-08-22 DIAGNOSIS — H401190 Primary open-angle glaucoma, unspecified eye, stage unspecified: Secondary | ICD-10-CM | POA: Diagnosis not present

## 2016-08-22 DIAGNOSIS — Z961 Presence of intraocular lens: Secondary | ICD-10-CM | POA: Diagnosis not present

## 2016-08-22 DIAGNOSIS — H43393 Other vitreous opacities, bilateral: Secondary | ICD-10-CM | POA: Diagnosis not present

## 2016-08-28 ENCOUNTER — Ambulatory Visit (INDEPENDENT_AMBULATORY_CARE_PROVIDER_SITE_OTHER): Payer: Medicare HMO | Admitting: Pharmacist

## 2016-08-28 DIAGNOSIS — Z8679 Personal history of other diseases of the circulatory system: Secondary | ICD-10-CM | POA: Diagnosis not present

## 2016-08-28 DIAGNOSIS — Z7901 Long term (current) use of anticoagulants: Secondary | ICD-10-CM

## 2016-08-28 LAB — POCT INR: INR: 2.3

## 2016-09-12 DIAGNOSIS — Z0000 Encounter for general adult medical examination without abnormal findings: Secondary | ICD-10-CM | POA: Diagnosis not present

## 2016-09-12 DIAGNOSIS — Z125 Encounter for screening for malignant neoplasm of prostate: Secondary | ICD-10-CM | POA: Diagnosis not present

## 2016-09-19 ENCOUNTER — Telehealth: Payer: Self-pay | Admitting: Cardiovascular Disease

## 2016-09-19 DIAGNOSIS — I4891 Unspecified atrial fibrillation: Secondary | ICD-10-CM | POA: Diagnosis not present

## 2016-09-19 DIAGNOSIS — L218 Other seborrheic dermatitis: Secondary | ICD-10-CM | POA: Diagnosis not present

## 2016-09-19 DIAGNOSIS — R69 Illness, unspecified: Secondary | ICD-10-CM | POA: Diagnosis not present

## 2016-09-19 DIAGNOSIS — Z008 Encounter for other general examination: Secondary | ICD-10-CM | POA: Diagnosis not present

## 2016-09-19 DIAGNOSIS — Z0001 Encounter for general adult medical examination with abnormal findings: Secondary | ICD-10-CM | POA: Diagnosis not present

## 2016-09-19 DIAGNOSIS — N401 Enlarged prostate with lower urinary tract symptoms: Secondary | ICD-10-CM | POA: Diagnosis not present

## 2016-09-19 DIAGNOSIS — R7302 Impaired glucose tolerance (oral): Secondary | ICD-10-CM | POA: Diagnosis not present

## 2016-09-19 DIAGNOSIS — E7800 Pure hypercholesterolemia, unspecified: Secondary | ICD-10-CM | POA: Diagnosis not present

## 2016-09-19 NOTE — Telephone Encounter (Signed)
Sherri (Dr. Sandi Mariscal office) calling, states that patient would like to switch from Warfarin to Eliquis and Dr. Sandi Mariscal was thinking about switching him but wanted Dr. Kennon Holter opinion first. I did inform her that Dr. Gwenlyn Found was out of the office. Thanks.

## 2016-09-19 NOTE — Telephone Encounter (Signed)
Spoke with Sherri at Dr. Deboraha Sprang office.  Patient has INR check in July, we will switch him to Eliquis at that time.  LMOM for patient.

## 2016-09-24 NOTE — Telephone Encounter (Signed)
Routing to Dr. Gwenlyn Found as Juluis Rainier.

## 2016-10-09 ENCOUNTER — Ambulatory Visit (INDEPENDENT_AMBULATORY_CARE_PROVIDER_SITE_OTHER): Payer: Medicare HMO | Admitting: Pharmacist

## 2016-10-09 DIAGNOSIS — I482 Chronic atrial fibrillation, unspecified: Secondary | ICD-10-CM | POA: Insufficient documentation

## 2016-10-09 DIAGNOSIS — Z8679 Personal history of other diseases of the circulatory system: Secondary | ICD-10-CM

## 2016-10-09 DIAGNOSIS — I4891 Unspecified atrial fibrillation: Secondary | ICD-10-CM

## 2016-10-09 DIAGNOSIS — Z7901 Long term (current) use of anticoagulants: Secondary | ICD-10-CM | POA: Diagnosis not present

## 2016-10-09 LAB — POCT INR: INR: 2.3

## 2016-10-09 MED ORDER — APIXABAN 5 MG PO TABS: 5.0000 mg | ORAL_TABLET | Freq: Two times a day (BID) | ORAL | 0 refills | Status: DC

## 2016-10-09 MED ORDER — APIXABAN 5 MG PO TABS
5.0000 mg | ORAL_TABLET | Freq: Two times a day (BID) | ORAL | 1 refills | Status: DC
Start: 2016-10-09 — End: 2017-03-20

## 2016-10-09 NOTE — Patient Instructions (Addendum)
Pt to start  Eliquis for on 10/10/16  Reviewed patients medication list.  Pt currently NOT on any combined P-gp and strong CYP3A4 inhibitors/inducers (ketoconazole, traconazole, ritonavir, carbamazepine, phenytoin, rifampin, St. John's wort).   Reviewed labs.  Dose based on age 83yo, weight 72kg, and Scr = 1.2;  Hgb and HCT  = 13/7/41.9  A full discussion of the nature of anticoagulants has been carried out.  A benefit/risk analysis has been presented to the patient, so that they understand the justification for choosing anticoagulation with Eliquis at this time.  The need for compliance is stressed.  Pt is aware to take the medication twice daily.  Side effects of potential bleeding are discussed, including unusual colored urine or stools, coughing up blood or coffee ground emesis, nose bleeds or serious fall or head trauma. The patient should avoid any OTC items containing aspirin or ibuprofen.  Avoid alcohol consumption.   Call if any signs of abnormal bleeding.  Discussed financial obligations and resolved any difficulty in obtaining medication.  Next lab test in 6 months.

## 2016-10-17 DIAGNOSIS — E039 Hypothyroidism, unspecified: Secondary | ICD-10-CM | POA: Diagnosis not present

## 2016-10-17 DIAGNOSIS — H401130 Primary open-angle glaucoma, bilateral, stage unspecified: Secondary | ICD-10-CM | POA: Diagnosis not present

## 2016-10-17 DIAGNOSIS — H43393 Other vitreous opacities, bilateral: Secondary | ICD-10-CM | POA: Diagnosis not present

## 2016-10-17 DIAGNOSIS — H34831 Tributary (branch) retinal vein occlusion, right eye, with macular edema: Secondary | ICD-10-CM | POA: Diagnosis not present

## 2016-10-17 DIAGNOSIS — Z961 Presence of intraocular lens: Secondary | ICD-10-CM | POA: Diagnosis not present

## 2016-11-04 ENCOUNTER — Other Ambulatory Visit: Payer: Self-pay | Admitting: Cardiovascular Disease

## 2016-12-03 DIAGNOSIS — M1A9XX0 Chronic gout, unspecified, without tophus (tophi): Secondary | ICD-10-CM | POA: Diagnosis not present

## 2016-12-03 DIAGNOSIS — I872 Venous insufficiency (chronic) (peripheral): Secondary | ICD-10-CM | POA: Diagnosis not present

## 2016-12-19 DIAGNOSIS — H401130 Primary open-angle glaucoma, bilateral, stage unspecified: Secondary | ICD-10-CM | POA: Diagnosis not present

## 2016-12-19 DIAGNOSIS — H34831 Tributary (branch) retinal vein occlusion, right eye, with macular edema: Secondary | ICD-10-CM | POA: Diagnosis not present

## 2016-12-19 DIAGNOSIS — Z961 Presence of intraocular lens: Secondary | ICD-10-CM | POA: Diagnosis not present

## 2016-12-19 DIAGNOSIS — H43393 Other vitreous opacities, bilateral: Secondary | ICD-10-CM | POA: Diagnosis not present

## 2017-01-30 DIAGNOSIS — H34831 Tributary (branch) retinal vein occlusion, right eye, with macular edema: Secondary | ICD-10-CM | POA: Diagnosis not present

## 2017-02-24 DIAGNOSIS — J209 Acute bronchitis, unspecified: Secondary | ICD-10-CM | POA: Diagnosis not present

## 2017-03-12 ENCOUNTER — Ambulatory Visit: Payer: Medicare HMO | Admitting: Cardiovascular Disease

## 2017-03-20 ENCOUNTER — Other Ambulatory Visit: Payer: Self-pay | Admitting: Cardiovascular Disease

## 2017-03-24 DIAGNOSIS — M109 Gout, unspecified: Secondary | ICD-10-CM | POA: Insufficient documentation

## 2017-03-24 DIAGNOSIS — R233 Spontaneous ecchymoses: Secondary | ICD-10-CM | POA: Insufficient documentation

## 2017-03-24 DIAGNOSIS — K648 Other hemorrhoids: Secondary | ICD-10-CM | POA: Insufficient documentation

## 2017-03-24 DIAGNOSIS — H269 Unspecified cataract: Secondary | ICD-10-CM | POA: Insufficient documentation

## 2017-03-24 DIAGNOSIS — K635 Polyp of colon: Secondary | ICD-10-CM | POA: Insufficient documentation

## 2017-03-24 DIAGNOSIS — K760 Fatty (change of) liver, not elsewhere classified: Secondary | ICD-10-CM | POA: Insufficient documentation

## 2017-03-24 DIAGNOSIS — K5790 Diverticulosis of intestine, part unspecified, without perforation or abscess without bleeding: Secondary | ICD-10-CM | POA: Insufficient documentation

## 2017-03-24 DIAGNOSIS — K219 Gastro-esophageal reflux disease without esophagitis: Secondary | ICD-10-CM | POA: Insufficient documentation

## 2017-03-24 DIAGNOSIS — R945 Abnormal results of liver function studies: Secondary | ICD-10-CM

## 2017-03-24 DIAGNOSIS — D649 Anemia, unspecified: Secondary | ICD-10-CM | POA: Insufficient documentation

## 2017-03-24 DIAGNOSIS — M1990 Unspecified osteoarthritis, unspecified site: Secondary | ICD-10-CM | POA: Insufficient documentation

## 2017-03-24 DIAGNOSIS — R238 Other skin changes: Secondary | ICD-10-CM | POA: Insufficient documentation

## 2017-03-24 DIAGNOSIS — R351 Nocturia: Secondary | ICD-10-CM | POA: Insufficient documentation

## 2017-03-24 DIAGNOSIS — N400 Enlarged prostate without lower urinary tract symptoms: Secondary | ICD-10-CM | POA: Insufficient documentation

## 2017-03-24 DIAGNOSIS — K209 Esophagitis, unspecified without bleeding: Secondary | ICD-10-CM | POA: Insufficient documentation

## 2017-03-24 DIAGNOSIS — R7302 Impaired glucose tolerance (oral): Secondary | ICD-10-CM | POA: Insufficient documentation

## 2017-03-24 DIAGNOSIS — H409 Unspecified glaucoma: Secondary | ICD-10-CM | POA: Insufficient documentation

## 2017-03-24 DIAGNOSIS — N529 Male erectile dysfunction, unspecified: Secondary | ICD-10-CM | POA: Insufficient documentation

## 2017-03-24 DIAGNOSIS — E785 Hyperlipidemia, unspecified: Secondary | ICD-10-CM | POA: Insufficient documentation

## 2017-03-24 DIAGNOSIS — H9190 Unspecified hearing loss, unspecified ear: Secondary | ICD-10-CM | POA: Insufficient documentation

## 2017-03-24 DIAGNOSIS — L853 Xerosis cutis: Secondary | ICD-10-CM | POA: Insufficient documentation

## 2017-03-24 DIAGNOSIS — E559 Vitamin D deficiency, unspecified: Secondary | ICD-10-CM | POA: Insufficient documentation

## 2017-03-24 DIAGNOSIS — R7989 Other specified abnormal findings of blood chemistry: Secondary | ICD-10-CM | POA: Insufficient documentation

## 2017-03-24 DIAGNOSIS — J189 Pneumonia, unspecified organism: Secondary | ICD-10-CM | POA: Insufficient documentation

## 2017-04-02 ENCOUNTER — Encounter: Payer: Self-pay | Admitting: Cardiovascular Disease

## 2017-04-02 ENCOUNTER — Ambulatory Visit (INDEPENDENT_AMBULATORY_CARE_PROVIDER_SITE_OTHER): Payer: Medicare HMO | Admitting: Cardiovascular Disease

## 2017-04-02 DIAGNOSIS — I482 Chronic atrial fibrillation, unspecified: Secondary | ICD-10-CM

## 2017-04-02 NOTE — Assessment & Plan Note (Signed)
History of hyperlipidemia on statin therapy followed by his PCP 

## 2017-04-02 NOTE — Progress Notes (Signed)
04/02/2017 Randy Fuller   1933/05/21  [id number]  Primary Physician Randy Late, MD Primary Cardiologist: Randy Harp MD Randy Fuller, [place]  HPI:  Randy Fuller is a 84 y.o.  thin-appearing, married [place] American male, father of 7, grandfather to 47 grandchildren who I saw in the office 03/12/16. He has a history of paroxysmal A-fib in the past, now chronic A-fib on Eliquis oral anticoagulation. His other problems include hyperlipidemia. He is entirely asymptomatic. Dr. Sandi Fuller follows his lipid profile.his cholesterol was increased from 20-40 mg a day by Dr. Sandi Fuller. Since I saw him back one year ago he denies chest pain or shortness of breath.     Current Meds  Medication Sig  . allopurinol (ZYLOPRIM) 300 MG tablet Take 450 mg by mouth daily.  [name] apixaban (ELIQUIS) 5 MG TABS tablet Take 1 tablet (5 mg total) by mouth 2 (two) times daily.  [name] aspirin 81 MG chewable tablet Chew 81 mg by mouth daily.  . benzonatate (TESSALON) 100 MG capsule Take 1 capsule (100 mg total) by mouth every 8 (eight) hours.  . Cholecalciferol 1000 UNITS capsule Take 1,000 Units by mouth daily.  . digoxin (LANOXIN) 0.125 MG tablet TAKE 1 TABLET DAILY  . dorzolamide-timolol (COSOPT) 22.3-6.8 MG/ML ophthalmic solution Place 1 drop into both eyes 2 (two) times daily.   [name] ELIQUIS 5 MG TABS tablet TAKE 1 TABLET BY MOUTH TWICE A DAY  . finasteride (PROSCAR) 5 MG tablet Take 5 mg by mouth daily.  [name] latanoprost (XALATAN) 0.005 % ophthalmic solution Place 1 drop into both eyes daily.  . Multiple Vitamins-Minerals (CENTRUM SILVER PO) Take 1 tablet by mouth daily.   . rosuvastatin (CRESTOR) 40 MG tablet Take 1 tablet (40 mg total) by mouth daily.  . sildenafil (VIAGRA) 100 MG tablet Take 100 mg by mouth as needed.   . Tamsulosin HCl (FLOMAX) 0.4 MG CAPS Take 0.4 mg by mouth.  . triamcinolone ointment (KENALOG) 0.1 % Apply 1 application topically 2 (two) times daily as needed (DRY SKIN).      Allergies  Allergen Reactions  . Pork-Derived Products Swelling    Social History   Socioeconomic History  . Marital status: Married    Spouse name: Not on file  . Number of children: Not on file  . Years of education: Not on file  . Highest education level: Not on file  Social Needs  . Financial resource strain: Not on file  . Food insecurity - worry: Not on file  . Food insecurity - inability: Not on file  . Transportation needs - medical: Not on file  . Transportation needs - non-medical: Not on file  Occupational History  . 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 Smoking status: Never Smoker  . Smokeless tobacco: Never Used  Substance and Sexual Activity  . Alcohol use: No  . Drug use: No  . Sexual activity: Not on file  Other Topics Concern  . Not on file  Social History Narrative  . Not on file     Review of Systems: General: negative for chills, fever, night sweats or weight changes.  Cardiovascular: negative for chest pain, dyspnea on exertion, edema, orthopnea, palpitations, paroxysmal nocturnal dyspnea or shortness of breath Dermatological: negative for rash Respiratory: negative for cough or wheezing Urologic: negative for hematuria Abdominal: negative for nausea, vomiting, diarrhea, bright red blood per rectum, melena, or hematemesis Neurologic: negative for visual changes, syncope, or dizziness  All other systems reviewed and are otherwise negative except as noted above.    Blood pressure (!) 150/66, pulse (!) 48, height 5\' 11"  (1.803 m), weight 157 lb (71.2 kg).  General appearance: alert and no distress Neck: no adenopathy, no carotid bruit, no JVD, supple, symmetrical, trachea midline and thyroid not enlarged, symmetric, no tenderness/mass/nodules Lungs: clear to auscultation bilaterally Heart: irregularly irregular rhythm Extremities: extremities normal, atraumatic, no cyanosis or edema Pulses: 2+ and symmetric Skin:  Skin color, texture, turgor normal. No rashes or lesions Neurologic: Alert and oriented X 3, normal strength and tone. Normal symmetric reflexes. Normal coordination and gait  EKG atrial fibrillation with a ventricular response of 48 and evidence of lethargy hypertrophy with repolarization changes/lateral T-wave inversion. I personally reviewed this EKG.  ASSESSMENT AND PLAN:   HYPERLIPIDEMIA History of hyperlipidemia on statin therapy followed by his PCP  Atrial fibrillation (HCC) History of chronic atrial fibrillation on Eliquis oral anticoagulation rate controlled on digoxin.      Randy Harp MD FACP,FACC,FAHA, [facility] 04/02/2017 9:51 AM

## 2017-04-02 NOTE — Progress Notes (Signed)
ekg 

## 2017-04-02 NOTE — Assessment & Plan Note (Signed)
History of chronic atrial fibrillation on Eliquis oral anticoagulation rate controlled on digoxin.

## 2017-04-02 NOTE — Patient Instructions (Signed)

## 2017-04-03 DIAGNOSIS — H34831 Tributary (branch) retinal vein occlusion, right eye, with macular edema: Secondary | ICD-10-CM | POA: Diagnosis not present

## 2017-04-04 NOTE — Addendum Note (Signed)
Addended by: Ulice Brilliant T on: 04/04/2017 02:26 PM   Modules accepted: Orders

## 2017-04-07 DIAGNOSIS — R972 Elevated prostate specific antigen [PSA]: Secondary | ICD-10-CM | POA: Diagnosis not present

## 2017-04-16 DIAGNOSIS — R972 Elevated prostate specific antigen [PSA]: Secondary | ICD-10-CM | POA: Diagnosis not present

## 2017-04-16 DIAGNOSIS — N401 Enlarged prostate with lower urinary tract symptoms: Secondary | ICD-10-CM | POA: Diagnosis not present

## 2017-04-16 DIAGNOSIS — R351 Nocturia: Secondary | ICD-10-CM | POA: Diagnosis not present

## 2017-06-05 DIAGNOSIS — H34831 Tributary (branch) retinal vein occlusion, right eye, with macular edema: Secondary | ICD-10-CM | POA: Diagnosis not present

## 2017-06-12 DIAGNOSIS — H3561 Retinal hemorrhage, right eye: Secondary | ICD-10-CM | POA: Diagnosis not present

## 2017-06-12 DIAGNOSIS — H3509 Other intraretinal microvascular abnormalities: Secondary | ICD-10-CM | POA: Diagnosis not present

## 2017-06-12 DIAGNOSIS — H52223 Regular astigmatism, bilateral: Secondary | ICD-10-CM | POA: Diagnosis not present

## 2017-06-12 DIAGNOSIS — H35031 Hypertensive retinopathy, right eye: Secondary | ICD-10-CM | POA: Diagnosis not present

## 2017-06-12 DIAGNOSIS — I10 Essential (primary) hypertension: Secondary | ICD-10-CM | POA: Diagnosis not present

## 2017-06-12 DIAGNOSIS — H401113 Primary open-angle glaucoma, right eye, severe stage: Secondary | ICD-10-CM | POA: Diagnosis not present

## 2017-06-12 DIAGNOSIS — H401122 Primary open-angle glaucoma, left eye, moderate stage: Secondary | ICD-10-CM | POA: Diagnosis not present

## 2017-06-12 DIAGNOSIS — H524 Presbyopia: Secondary | ICD-10-CM | POA: Diagnosis not present

## 2017-06-12 DIAGNOSIS — H5212 Myopia, left eye: Secondary | ICD-10-CM | POA: Diagnosis not present

## 2017-06-18 ENCOUNTER — Other Ambulatory Visit: Payer: Self-pay | Admitting: Cardiovascular Disease

## 2017-06-27 ENCOUNTER — Telehealth: Payer: Self-pay | Admitting: Cardiovascular Disease

## 2017-06-27 NOTE — Telephone Encounter (Signed)
New message   Pt c/o of Chest Pain: STAT if CP now or developed within 24 hours  1. Are you having CP right now? SLIGHT PAIN  2. Are you experiencing any other symptoms (ex. SOB, nausea, vomiting, sweating)? NO  3. How long have you been experiencing CP? 2 days  4. Is your CP continuous or coming and going? CONTINUOUS  5. Have you taken Nitroglycerin? NO ?

## 2017-06-27 NOTE — Telephone Encounter (Signed)
Returned call to patient of Dr. Gwenlyn Found who reports chest pain for 2 days. He reports pain that originates in his stomach. He reports the pain is constant. He stopped ASA 81mg  one week ago dt/ notices on TV that it was not necessary. Advised this would be at the discretion of his cardiologist to advise if he should stop.  Patient denies SOB, nausea, vomiting. He has MD OV on 4/9  Explained to patient that he needs to seek ED evaluation for acute chest pain symptoms. Explained the office setting does not have capability to manage acute coronary issues. He voiced understanding of recommendations.

## 2017-07-03 ENCOUNTER — Encounter: Payer: Self-pay | Admitting: Physician Assistant

## 2017-07-03 ENCOUNTER — Ambulatory Visit (INDEPENDENT_AMBULATORY_CARE_PROVIDER_SITE_OTHER): Payer: Medicare HMO | Admitting: Physician Assistant

## 2017-07-03 VITALS — BP 130/64 | HR 50 | Ht 71.0 in | Wt 155.6 lb

## 2017-07-03 DIAGNOSIS — I10 Essential (primary) hypertension: Secondary | ICD-10-CM | POA: Diagnosis not present

## 2017-07-03 DIAGNOSIS — R079 Chest pain, unspecified: Secondary | ICD-10-CM | POA: Diagnosis not present

## 2017-07-03 DIAGNOSIS — I482 Chronic atrial fibrillation: Secondary | ICD-10-CM

## 2017-07-03 DIAGNOSIS — Z7901 Long term (current) use of anticoagulants: Secondary | ICD-10-CM | POA: Diagnosis not present

## 2017-07-03 DIAGNOSIS — I4821 Permanent atrial fibrillation: Secondary | ICD-10-CM

## 2017-07-03 DIAGNOSIS — Z79899 Other long term (current) drug therapy: Secondary | ICD-10-CM | POA: Diagnosis not present

## 2017-07-03 LAB — DIGOXIN LEVEL: DIGOXIN, SERUM: 0.8 ng/mL (ref 0.5–0.9)

## 2017-07-03 MED ORDER — AMLODIPINE BESYLATE 2.5 MG PO TABS: 2.5000 mg | ORAL_TABLET | Freq: Every day | ORAL | 3 refills | Status: DC

## 2017-07-03 NOTE — Progress Notes (Signed)
Cardiology Office Note   Date:  07/03/2017   ID:  Randy Fuller, Randy Fuller 1934/02/13, MRN [id number]  PCP:  Derinda Late, MD  Cardiologist: Dr. Gwenlyn Found, 04/02/2017 Rosaria Ferries, PA-C   Chief Complaint  Patient presents with  . Shortness of Breath    feels like SOB is getting worse  . Hypertension    pt states he has noticed his BPs running alittle higher in the 150s    History of Present Illness: Randy Fuller is a 84 y.o. male originally from [place], with a history of chronic A. fib on Eliquis and digoxin, HLD followed by PCP, BPH, Colon polyps, GERD, nl MV 2014 w/ EF 64%  Phone notes regarding chest pain that starts in his stomach, appointment made  Randy Fuller presents for cardiology evaluation.  Last week, he was feeling L sided abd pain>>chest pain. He had been outside working in the sun. The pain resolved without intervention. It lasted a couple of days. He has had it before, rarely, but this time it lasted longer. 7/10 at its worst. It would wax and wane, but did not resolve for 2 days.  He did not take any medications for it.  He does not think it was worse with deep inspiration and there was no association with meals.  He is compliant with his meds, including digoxin and Eliquis.  Notes his blood pressure has been running in the 409W systolic recently  He gets a little light-headed at times. Occasional palpitations, feels his heart race. Not necessarily w/ exertion.   His legs will sometimes swell during the day. Occasional DOE, not severe, minimal change from baseline. No orthopnea or PND.  He and his wife are going to [place] in 20 days.  They are following the path of [place].   Past Medical History:  Diagnosis Date  . Anemia   . Arthritis   . Atrial fibrillation ([place])    on Coumadin but on hold for surgery bridging with lovenox  . Atrial fibrillation ([place])    also takes Digoxin daily  . Benign prostate hyperplasia   . Bilateral  pneumonia   . Bruises easily    d/t being on Coumadin  . Cataract   . Colon polyps    2003  . Diverticulosis   . Dry skin    uses Kenalog cream as needed  . ED (erectile dysfunction)   . Elevated LFTs   . Enlarged prostate    takes Flomax daily  . Esophageal stricture   . Esophagitis   . Fatty liver   . GERD (gastroesophageal reflux disease)    takes Prilosec daily  . Glaucoma   . Gout    just stopped medication this week per Medical Md(was on Allopurinol)  . Hearing loss   . Hernia   . Hyperlipidemia    takes Atorvastatin every other day  . Impaired glucose tolerance   . Internal hemorrhoids   . Irregular heart beat   . Nocturia   . Vitamin D deficiency     Past Surgical History:  Procedure Laterality Date  . CATARACT EXTRACTION     Both eyes  . DOPPLER ECHOCARDIOGRAPHY  2003  . EYE SURGERY Left 2010   glaucoma  . EYE SURGERY Bilateral 2013   cataract extractions and lens implants  . HERNIA REPAIR Left 08/31/12   Left Inguinal Hernia Repair  . INGUINAL HERNIA REPAIR Left 08/31/2012   Procedure: LAPAROSCOPIC INGUINAL HERNIA;  Surgeon: Ralene Ok,  MD;  Location: [facility] [place];  Service: General;  Laterality: Left;  . INSERTION OF MESH Left 08/31/2012   Procedure: INSERTION OF MESH;  Surgeon: Ralene Ok, MD;  Location: [place];  Service: General;  Laterality: Left;  . NM MYOVIEW LTD  2003    Current Outpatient Medications  Medication Sig Dispense Refill  . allopurinol (ZYLOPRIM) 300 MG tablet Take 450 mg by mouth daily.    [name] aspirin 81 MG chewable tablet Chew 81 mg by mouth daily.    . Cholecalciferol 1000 UNITS capsule Take 1,000 Units by mouth daily.    . digoxin (LANOXIN) 0.125 MG tablet TAKE 1 TABLET DAILY 90 tablet 3  . dorzolamide-timolol (COSOPT) 22.3-6.8 MG/ML ophthalmic solution Place 1 drop into both eyes 2 (two) times daily.     [name] ELIQUIS 5 MG TABS tablet TAKE 1 TABLET BY MOUTH TWICE A DAY 180 tablet 1  . finasteride (PROSCAR) 5 MG tablet Take 5 mg by  mouth daily.    [name] latanoprost (XALATAN) 0.005 % ophthalmic solution Place 1 drop into both eyes daily.    . Multiple Vitamins-Minerals (CENTRUM SILVER PO) Take 1 tablet by mouth daily.     . rosuvastatin (CRESTOR) 40 MG tablet Take 1 tablet (40 mg total) by mouth daily. 90 tablet 3  . sildenafil (VIAGRA) 100 MG tablet Take 100 mg by mouth as needed.     . Tamsulosin HCl (FLOMAX) 0.4 MG CAPS Take 0.4 mg by mouth.    . triamcinolone ointment (KENALOG) 0.1 % Apply 1 application topically 2 (two) times daily as needed (DRY SKIN).      No current facility-administered medications for this visit.     Allergies:   Pork-derived products    Social History:  The patient  reports that he has never smoked. He has never used smokeless tobacco. He reports that he does not drink alcohol or use drugs.   Family History:  The patient's family history includes Cancer in his sister and unknown relative; Cirrhosis in his brother; Heart disease in his brother and sister.    ROS:  Please see the history of present illness. All other systems are reviewed and negative.    PHYSICAL EXAM: VS:  BP 130/64 (BP Location: Left Arm, Patient Position: Sitting, Cuff Size: Normal)   Pulse (!) 50   Ht 5\' 11"  (1.803 m)   Wt 155 lb 9.6 oz (70.6 kg)   BMI 21.70 kg/m  , BMI Body mass index is 21.7 kg/m. GEN: Well nourished, well developed, male in no acute distress  HEENT: normal for age  Neck: minimal JVD, no carotid bruit, no masses Cardiac: Irregular R&R; no murmur, no rubs, or gallops Respiratory:  clear to auscultation bilaterally, normal work of breathing GI: soft, nontender, nondistended, + BS MS: no deformity or atrophy; no edema; distal pulses are 2+ in all 4 extremities   Skin: warm and dry, no rash Neuro:  Strength and sensation are intact Psych: euthymic mood, full affect   EKG:  EKG is ordered today. The ekg ordered today demonstrates atrial fibrillation, heart rate 50, inferior and lateral T wave  changes as well as the low heart rate are consistent with ECG dated 04/02/2017   Recent Labs: No results found for requested labs within last 8760 hours.    Lipid Panel No results found for: CHOL, TRIG, HDL, CHOLHDL, VLDL, LDLCALC, LDLDIRECT   Wt Readings from Last 3 Encounters:  07/03/17 155 lb 9.6 oz (70.6 kg)  04/02/17 157 lb (71.2  kg)  03/12/16 159 lb (72.1 kg)     Other studies Reviewed: Additional studies/ records that were reviewed today include: Faxed records, office notes and testing  ASSESSMENT AND PLAN:  1.  Chest pain: His symptoms started with exertion.  They were prolonged without any acute ECG changes.  However, he has multiple cardiac risk factors and is planning a prolonged out of the country trip in the near future.  We will get a Lexiscan Myoview, follow-up on results.  2.  Permanent atrial fibrillation: His heart rate is low, he is asymptomatic with this.  Check a dig level  3.  Hypertension: His blood pressure is at target today, but per reports, it has been running in the 568S systolic.  Add amlodipine 2.5 mg and follow.  4.  Chronic anticoagulation: He is compliant with the Eliquis.  He was taking aspirin for a while and asked if he should continue this.  I advised him that the latest study does not show any benefit from the aspirin, okay to stop it.  Current medicines are reviewed at length with the patient today.  The patient has concerns regarding medicines.  Concerns were addressed  The following changes have been made: Stop aspirin, add amlodipine  Labs/ tests ordered today include:   Orders Placed This Encounter  Procedures  . Digoxin level  . MYOCARDIAL PERFUSION IMAGING  . EKG 12-Lead     Disposition:   FU with Dr. Gwenlyn Found  Signed, Rosaria Ferries, PA-C  07/03/2017 10:39 AM    [place] Phone: [phone]; Fax: [phone]  This note was written with the assistance of speech recognition software. Please excuse  any transcriptional errors.

## 2017-07-03 NOTE — Patient Instructions (Signed)
Medication Instructions:  Your physician has recommended you make the following change in your medication:  1) START - Take Norvac 2.5 mg tablet by mouth ONCE daily   Labwork: Your physician recommends that you return for lab work in: TODAY ([place])   Testing/Procedures: Your physician has requested that you have a lexiscan myoview. For further information please visit [email or web address]. Please follow instruction sheet, as given.    Follow-Up: Your physician recommends that you schedule a follow-up appointment in: July 2019 with Dr. Gwenlyn Found.   Any Other Special Instructions Will Be Listed Below (If Applicable).     If you need a refill on your cardiac medications before your next appointment, please call your pharmacy.

## 2017-07-04 ENCOUNTER — Telehealth (HOSPITAL_COMMUNITY): Payer: Self-pay

## 2017-07-04 NOTE — Telephone Encounter (Signed)
Encounter complete. 

## 2017-07-08 ENCOUNTER — Ambulatory Visit (HOSPITAL_COMMUNITY)
Admission: RE | Admit: 2017-07-08 | Discharge: 2017-07-08 | Disposition: A | Discharge status: Home / Self Care | Payer: Medicare HMO | Source: Ambulatory Visit | Attending: Cardiology | Admitting: Cardiology

## 2017-07-08 ENCOUNTER — Ambulatory Visit: Payer: Medicare HMO | Admitting: Cardiovascular Disease

## 2017-07-08 DIAGNOSIS — I482 Chronic atrial fibrillation: Secondary | ICD-10-CM | POA: Insufficient documentation

## 2017-07-08 DIAGNOSIS — R079 Chest pain, unspecified: Secondary | ICD-10-CM | POA: Insufficient documentation

## 2017-07-08 DIAGNOSIS — I4821 Permanent atrial fibrillation: Secondary | ICD-10-CM

## 2017-07-08 LAB — MYOCARDIAL PERFUSION IMAGING
CHL CUP NUCLEAR SSS: 0
CHL CUP RESTING HR STRESS: 44 {beats}/min
LVDIAVOL: 128 mL (ref 62–150)
LVSYSVOL: 48 mL
Peak HR: 64 {beats}/min
SDS: 0
SRS: 0
TID: 0.96

## 2017-07-08 MED ORDER — TECHNETIUM TC 99M TETROFOSMIN IV KIT
10.2000 | PACK | Freq: Once | INTRAVENOUS | Status: AC | PRN
  Administered 2017-07-08: 10.2 via INTRAVENOUS
  Filled 2017-07-08: qty 11

## 2017-07-08 MED ORDER — TECHNETIUM TC 99M TETROFOSMIN IV KIT
32.1000 | PACK | Freq: Once | INTRAVENOUS | Status: AC | PRN
  Administered 2017-07-08: 32.1 via INTRAVENOUS
  Filled 2017-07-08: qty 33

## 2017-07-08 MED ORDER — REGADENOSON 0.4 MG/5ML IV SOLN
0.4000 mg | Freq: Once | INTRAVENOUS | Status: AC
  Administered 2017-07-08: 0.4 mg via INTRAVENOUS

## 2017-07-14 ENCOUNTER — Telehealth: Payer: Self-pay | Admitting: Cardiovascular Disease

## 2017-07-14 NOTE — Telephone Encounter (Signed)
New message  ° ° °Patient calling for test results.   °

## 2017-07-15 NOTE — Telephone Encounter (Signed)
Follow up    Please call patient at [phone] with results.

## 2017-07-15 NOTE — Telephone Encounter (Signed)
Left message for patient to contact office.

## 2017-07-15 NOTE — Telephone Encounter (Signed)
Called pt with stress test result. Pt verbalized understanding.

## 2017-09-16 ENCOUNTER — Other Ambulatory Visit: Payer: Self-pay | Admitting: Cardiovascular Disease

## 2017-09-18 ENCOUNTER — Other Ambulatory Visit: Payer: Self-pay | Admitting: Physician Assistant

## 2017-09-18 MED ORDER — DIGOXIN 125 MCG PO TABS: 125.0000 ug | ORAL_TABLET | Freq: Every day | ORAL | 3 refills | Status: DC

## 2017-09-18 MED ORDER — APIXABAN 5 MG PO TABS: 5.0000 mg | ORAL_TABLET | Freq: Two times a day (BID) | ORAL | 1 refills | Status: DC

## 2017-09-18 NOTE — Telephone Encounter (Signed)
New Message    *STAT* If patient is at the pharmacy, call can be transferred to refill team.   1. Which medications need to be refilled? (please list name of each medication and dose if known) ELIQUIS 5 MG TABS tablet and digoxin (LANOXIN) 0.125 MG tablet  2. Which pharmacy/location (including street and city if local pharmacy) is medication to be sent to? [id number] - [place], [place] - [address]  3. Do they need a 30 day or 90 day supply? [place]

## 2017-09-22 DIAGNOSIS — D152 Benign neoplasm of mediastinum: Secondary | ICD-10-CM | POA: Diagnosis not present

## 2017-10-08 DIAGNOSIS — M1A9XX0 Chronic gout, unspecified, without tophus (tophi): Secondary | ICD-10-CM | POA: Diagnosis not present

## 2017-10-08 DIAGNOSIS — L853 Xerosis cutis: Secondary | ICD-10-CM | POA: Diagnosis not present

## 2017-10-08 DIAGNOSIS — Z0000 Encounter for general adult medical examination without abnormal findings: Secondary | ICD-10-CM | POA: Diagnosis not present

## 2017-10-08 DIAGNOSIS — I4891 Unspecified atrial fibrillation: Secondary | ICD-10-CM | POA: Diagnosis not present

## 2017-10-08 DIAGNOSIS — N401 Enlarged prostate with lower urinary tract symptoms: Secondary | ICD-10-CM | POA: Diagnosis not present

## 2017-10-09 ENCOUNTER — Other Ambulatory Visit (HOSPITAL_COMMUNITY): Payer: Self-pay | Admitting: Family Medicine

## 2017-10-09 DIAGNOSIS — R009 Unspecified abnormalities of heart beat: Secondary | ICD-10-CM

## 2017-10-09 DIAGNOSIS — H401190 Primary open-angle glaucoma, unspecified eye, stage unspecified: Secondary | ICD-10-CM | POA: Diagnosis not present

## 2017-10-09 DIAGNOSIS — Z961 Presence of intraocular lens: Secondary | ICD-10-CM | POA: Diagnosis not present

## 2017-10-09 DIAGNOSIS — H43393 Other vitreous opacities, bilateral: Secondary | ICD-10-CM | POA: Diagnosis not present

## 2017-10-09 DIAGNOSIS — H34831 Tributary (branch) retinal vein occlusion, right eye, with macular edema: Secondary | ICD-10-CM | POA: Diagnosis not present

## 2017-10-10 ENCOUNTER — Other Ambulatory Visit: Payer: Self-pay

## 2017-10-10 ENCOUNTER — Ambulatory Visit (HOSPITAL_COMMUNITY): Payer: Medicare HMO | Attending: Cardiology

## 2017-10-10 DIAGNOSIS — R009 Unspecified abnormalities of heart beat: Secondary | ICD-10-CM | POA: Insufficient documentation

## 2017-10-10 DIAGNOSIS — I4891 Unspecified atrial fibrillation: Secondary | ICD-10-CM | POA: Diagnosis not present

## 2017-10-10 DIAGNOSIS — I083 Combined rheumatic disorders of mitral, aortic and tricuspid valves: Secondary | ICD-10-CM | POA: Diagnosis not present

## 2017-10-10 DIAGNOSIS — R9431 Abnormal electrocardiogram [ECG] [EKG]: Secondary | ICD-10-CM | POA: Insufficient documentation

## 2017-10-10 DIAGNOSIS — E785 Hyperlipidemia, unspecified: Secondary | ICD-10-CM | POA: Diagnosis not present

## 2017-11-07 DIAGNOSIS — D649 Anemia, unspecified: Secondary | ICD-10-CM | POA: Diagnosis not present

## 2017-11-21 ENCOUNTER — Ambulatory Visit (INDEPENDENT_AMBULATORY_CARE_PROVIDER_SITE_OTHER): Payer: Medicare HMO | Admitting: Cardiovascular Disease

## 2017-11-21 ENCOUNTER — Encounter: Payer: Self-pay | Admitting: Cardiovascular Disease

## 2017-11-21 DIAGNOSIS — I482 Chronic atrial fibrillation, unspecified: Secondary | ICD-10-CM

## 2017-11-21 LAB — DIGOXIN LEVEL: DIGOXIN, SERUM: 0.6 ng/mL (ref 0.5–0.9)

## 2017-11-21 NOTE — Assessment & Plan Note (Signed)
History of hyperlipidemia on statin therapy with recent lipid profile performed 09/22/2017 revealing total cholesterol 183, LDL of 85 and HDL of 85.

## 2017-11-21 NOTE — Assessment & Plan Note (Signed)
History of chronic A. fib on Eliquis oral anticoagulation with slow ventricular response.  He is on digoxin.  He denies chest pain, shortness of breath or dizziness.

## 2017-11-21 NOTE — Progress Notes (Signed)
11/21/2017 Randy Fuller   [birth year]  [id number]  Primary Physician Randy Late, MD Primary Cardiologist: Randy Harp MD Randy Fuller, [place]  HPI:  Randy Fuller is a 84 y.o.  thin-appearing, married [place] American male, father of 33, grandfather to 28 grandchildren who I sawin the office  04/02/2017.[name] He has a history of paroxysmal A-fib in the past, now chronic A-fib on Eliquis oral anticoagulation. His other problems include hyperlipidemia. He is entirely asymptomatic. Dr. Sandi Fuller follows his lipid profile.his Crestor was increased from 20-40 mg a day by Dr. Sandi Fuller  Since I saw him back one year ago he denies chest pain or shortness of breath.  He did have a Myoview ordered by Dr. Georgena Fuller 07/08/2017 which was nonischemic and low risk.  A 2D echocardiogram performed 10/10/2017 showed normal LV size and function with a speckled appearance in his myocardium suggestive of amyloid however he is completely asymptomatic and therefore I will not pursue a pyrophosphate scan or cardiac MRI.    Current Meds  Medication Sig  . allopurinol (ZYLOPRIM) 300 MG tablet Take 450 mg by mouth daily.  [name] amLODipine (NORVASC) 2.5 MG tablet Take 1 tablet (2.5 mg total) by mouth daily.  [name] apixaban (ELIQUIS) 5 MG TABS tablet Take 1 tablet (5 mg total) by mouth 2 (two) times daily.  . Cholecalciferol 1000 UNITS capsule Take 1,000 Units by mouth daily.  . digoxin (LANOXIN) 0.125 MG tablet Take 1 tablet (125 mcg total) by mouth daily.  . dorzolamide-timolol (COSOPT) 22.3-6.8 MG/ML ophthalmic solution Place 1 drop into both eyes 2 (two) times daily.   . finasteride (PROSCAR) 5 MG tablet Take 5 mg by mouth daily.  [name] latanoprost (XALATAN) 0.005 % ophthalmic solution Place 1 drop into both eyes daily.  [name] losartan (COZAAR) 25 MG tablet Take 25 mg by mouth daily.  . Multiple Vitamins-Minerals (CENTRUM SILVER PO) Take 1 tablet by mouth daily.   . rosuvastatin (CRESTOR) 40 MG tablet Take 1  tablet (40 mg total) by mouth daily.  . sildenafil (VIAGRA) 100 MG tablet Take 100 mg by mouth as needed.   . Tamsulosin HCl (FLOMAX) 0.4 MG CAPS Take 0.4 mg by mouth.  . triamcinolone ointment (KENALOG) 0.1 % Apply 1 application topically 2 (two) times daily as needed (DRY SKIN).      Allergies  Allergen Reactions  . Pork-Derived Products Swelling    Social History   Socioeconomic History  . Marital status: Married    Spouse name: Not on file  . Number of children: Not on file  . Years of education: Not on file  . Highest education level: Not on file  Occupational History  . 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 Financial resource strain: Not on file  . Food insecurity:    Worry: Not on file    Inability: Not on file  . Transportation needs:    Medical: Not on file    Non-medical: Not on file  Tobacco Use  . Smoking status: Never Smoker  . Smokeless tobacco: Never Used  Substance and Sexual Activity  . Alcohol use: No  . Drug use: No  . Sexual activity: Not on file  Lifestyle  . Physical activity:    Days per week: Not on file    Minutes per session: Not on file  . Stress: Not on file  Relationships  . Social connections:    Talks on phone: Not on file  Gets together: Not on file    Attends religious service: Not on file    Active member of club or organization: Not on file    Attends meetings of clubs or organizations: Not on file    Relationship status: Not on file  . Intimate partner violence:    Fear of current or ex partner: Not on file    Emotionally abused: Not on file    Physically abused: Not on file    Forced sexual activity: Not on file  Other Topics Concern  . Not on file  Social History Narrative  . Not on file     Review of Systems: General: negative for chills, fever, night sweats or weight changes.  Cardiovascular: negative for chest pain, dyspnea on exertion, edema, orthopnea, palpitations, paroxysmal  nocturnal dyspnea or shortness of breath Dermatological: negative for rash Respiratory: negative for cough or wheezing Urologic: negative for hematuria Abdominal: negative for nausea, vomiting, diarrhea, bright red blood per rectum, melena, or hematemesis Neurologic: negative for visual changes, syncope, or dizziness All other systems reviewed and are otherwise negative except as noted above.    Blood pressure 138/62, pulse (!) 47, height 5\' 11"  (1.803 m), weight 151 lb (68.5 kg).  General appearance: alert and no distress Neck: no adenopathy, no carotid bruit, no JVD, supple, symmetrical, trachea midline and thyroid not enlarged, symmetric, no tenderness/mass/nodules Lungs: clear to auscultation bilaterally Heart: regular rate and rhythm, S1, S2 normal, no murmur, click, rub or gallop Extremities: extremities normal, atraumatic, no cyanosis or edema Pulses: 2+ and symmetric Skin: Skin color, texture, turgor normal. No rashes or lesions Neurologic: Alert and oriented X 3, normal strength and tone. Normal symmetric reflexes. Normal coordination and gait  EKG not performed today  ASSESSMENT AND PLAN:   HYPERLIPIDEMIA History of hyperlipidemia on statin therapy with recent lipid profile performed 09/22/2017 revealing total cholesterol 183, LDL of 85 and HDL of 85.  Atrial fibrillation (HCC) History of chronic A. fib on Eliquis oral anticoagulation with slow ventricular response.  He is on digoxin.  He denies chest pain, shortness of breath or dizziness.      Randy Harp MD FACP,FACC,FAHA, [facility] 11/21/2017 11:23 AM

## 2017-11-21 NOTE — Patient Instructions (Signed)
Medication Instructions:   NO CHANGE  Labwork:  Your physician recommends that you HAVE LAB WORK TODAY  Follow-Up:  Your physician wants you to follow-up in: [place] will receive a reminder letter in the mail two months in advance. If you don't receive a letter, please call our office to schedule the follow-up appointment.  Your physician wants you to follow-up in: [place] will receive a reminder letter in the mail two months in advance. If you don't receive a letter, please call our office to schedule the follow-up appointment.   If you need a refill on your cardiac medications before your next appointment, please call your pharmacy.

## 2017-11-25 ENCOUNTER — Telehealth: Payer: Self-pay | Admitting: Cardiovascular Disease

## 2017-11-25 MED ORDER — APIXABAN 5 MG PO TABS: 5.0000 mg | ORAL_TABLET | Freq: Two times a day (BID) | ORAL | 0 refills | Status: DC

## 2017-11-25 MED ORDER — DIGOXIN 125 MCG PO TABS: 125.0000 ug | ORAL_TABLET | Freq: Every day | ORAL | 3 refills | Status: DC

## 2017-11-25 NOTE — Telephone Encounter (Signed)
BMTE per DR Sandi Mariscal)

## 2017-11-25 NOTE — Telephone Encounter (Signed)
New Message:       *STAT* If patient is at the pharmacy, call can be transferred to refill team.   1. Which medications need to be refilled? (please list name of each medication and dose if known) apixaban (ELIQUIS) 5 MG TABS tablet  digoxin (LANOXIN) 0.125 MG tablet  2. Which pharmacy/location (including street and city if local pharmacy) is medication to be sent to?[facility], [place] AT Portal to Registered [facility] Sites  3. Do they need a 30 day or 90 day supply? [place]

## 2017-12-08 DIAGNOSIS — I10 Essential (primary) hypertension: Secondary | ICD-10-CM | POA: Diagnosis not present

## 2017-12-09 ENCOUNTER — Telehealth: Payer: Self-pay

## 2017-12-09 NOTE — Telephone Encounter (Signed)
-----   Message from Lorretta Harp, MD sent at 12/07/2017  9:02 AM EDT ----- Excellent FLP by PCP 09/22/17

## 2017-12-09 NOTE — Telephone Encounter (Signed)
Called to give pt Dr.Berry's response to the test ordered by his pcp. lmtcb

## 2017-12-10 NOTE — Telephone Encounter (Signed)
Follow Up;      Returning Lattie Haw' Parris-Godley's call from yesterday.

## 2017-12-10 NOTE — Telephone Encounter (Signed)
Called patient and notified of message from [facility] regarding labs. Patient verbalized understanding.

## 2017-12-18 DIAGNOSIS — H34831 Tributary (branch) retinal vein occlusion, right eye, with macular edema: Secondary | ICD-10-CM | POA: Diagnosis not present

## 2017-12-26 ENCOUNTER — Other Ambulatory Visit: Payer: Self-pay | Admitting: Family Medicine

## 2017-12-26 ENCOUNTER — Ambulatory Visit
Admission: RE | Admit: 2017-12-26 | Discharge: 2017-12-26 | Disposition: A | Discharge status: Home / Self Care | Payer: Medicare HMO | Source: Ambulatory Visit | Attending: Family Medicine | Admitting: Family Medicine

## 2017-12-26 DIAGNOSIS — R609 Edema, unspecified: Secondary | ICD-10-CM

## 2017-12-26 DIAGNOSIS — M25561 Pain in right knee: Secondary | ICD-10-CM

## 2017-12-26 DIAGNOSIS — I872 Venous insufficiency (chronic) (peripheral): Secondary | ICD-10-CM | POA: Diagnosis not present

## 2017-12-26 DIAGNOSIS — M1A9XX0 Chronic gout, unspecified, without tophus (tophi): Secondary | ICD-10-CM | POA: Diagnosis not present

## 2017-12-26 DIAGNOSIS — M1000 Idiopathic gout, unspecified site: Secondary | ICD-10-CM | POA: Diagnosis not present

## 2017-12-26 DIAGNOSIS — I10 Essential (primary) hypertension: Secondary | ICD-10-CM | POA: Diagnosis not present

## 2017-12-26 DIAGNOSIS — M25461 Effusion, right knee: Secondary | ICD-10-CM | POA: Diagnosis not present

## 2017-12-26 DIAGNOSIS — M1711 Unilateral primary osteoarthritis, right knee: Secondary | ICD-10-CM | POA: Diagnosis not present

## 2018-01-09 DIAGNOSIS — I10 Essential (primary) hypertension: Secondary | ICD-10-CM | POA: Diagnosis not present

## 2018-01-09 DIAGNOSIS — M19072 Primary osteoarthritis, left ankle and foot: Secondary | ICD-10-CM | POA: Diagnosis not present

## 2018-01-09 DIAGNOSIS — M25461 Effusion, right knee: Secondary | ICD-10-CM | POA: Diagnosis not present

## 2018-02-13 ENCOUNTER — Other Ambulatory Visit: Payer: Self-pay | Admitting: Cardiovascular Disease

## 2018-02-19 DIAGNOSIS — H34831 Tributary (branch) retinal vein occlusion, right eye, with macular edema: Secondary | ICD-10-CM | POA: Diagnosis not present

## 2018-03-17 DIAGNOSIS — D649 Anemia, unspecified: Secondary | ICD-10-CM | POA: Diagnosis not present

## 2018-04-01 IMAGING — DX DG CHEST 2V
2 series · 2 of 2 positions shown · non-contrast
Comparison: 02/21/2014

CLINICAL DATA: Nonproductive cough beginning yesterday, fever,
history atrial fibrillation, gout, hyperlipidemia

EXAM:
CHEST  2 VIEW

[chest pa]
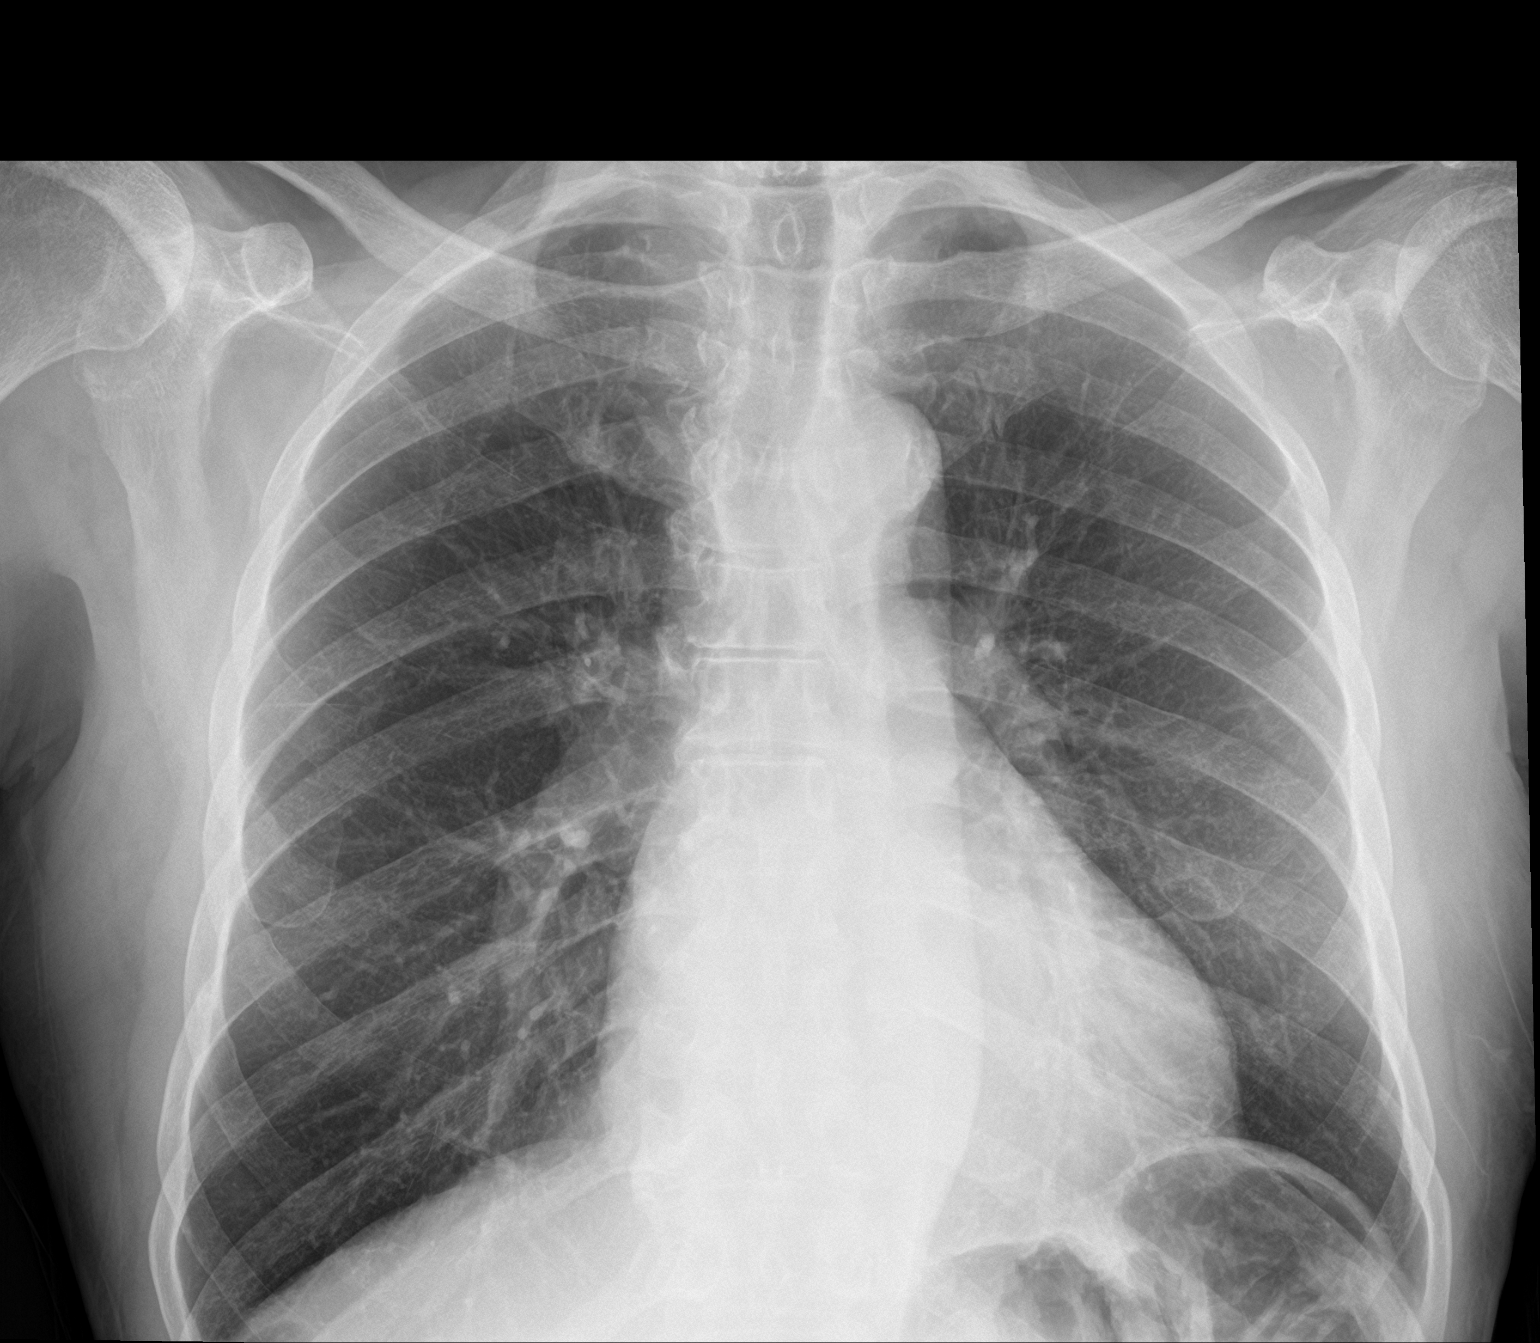

[chest lat]
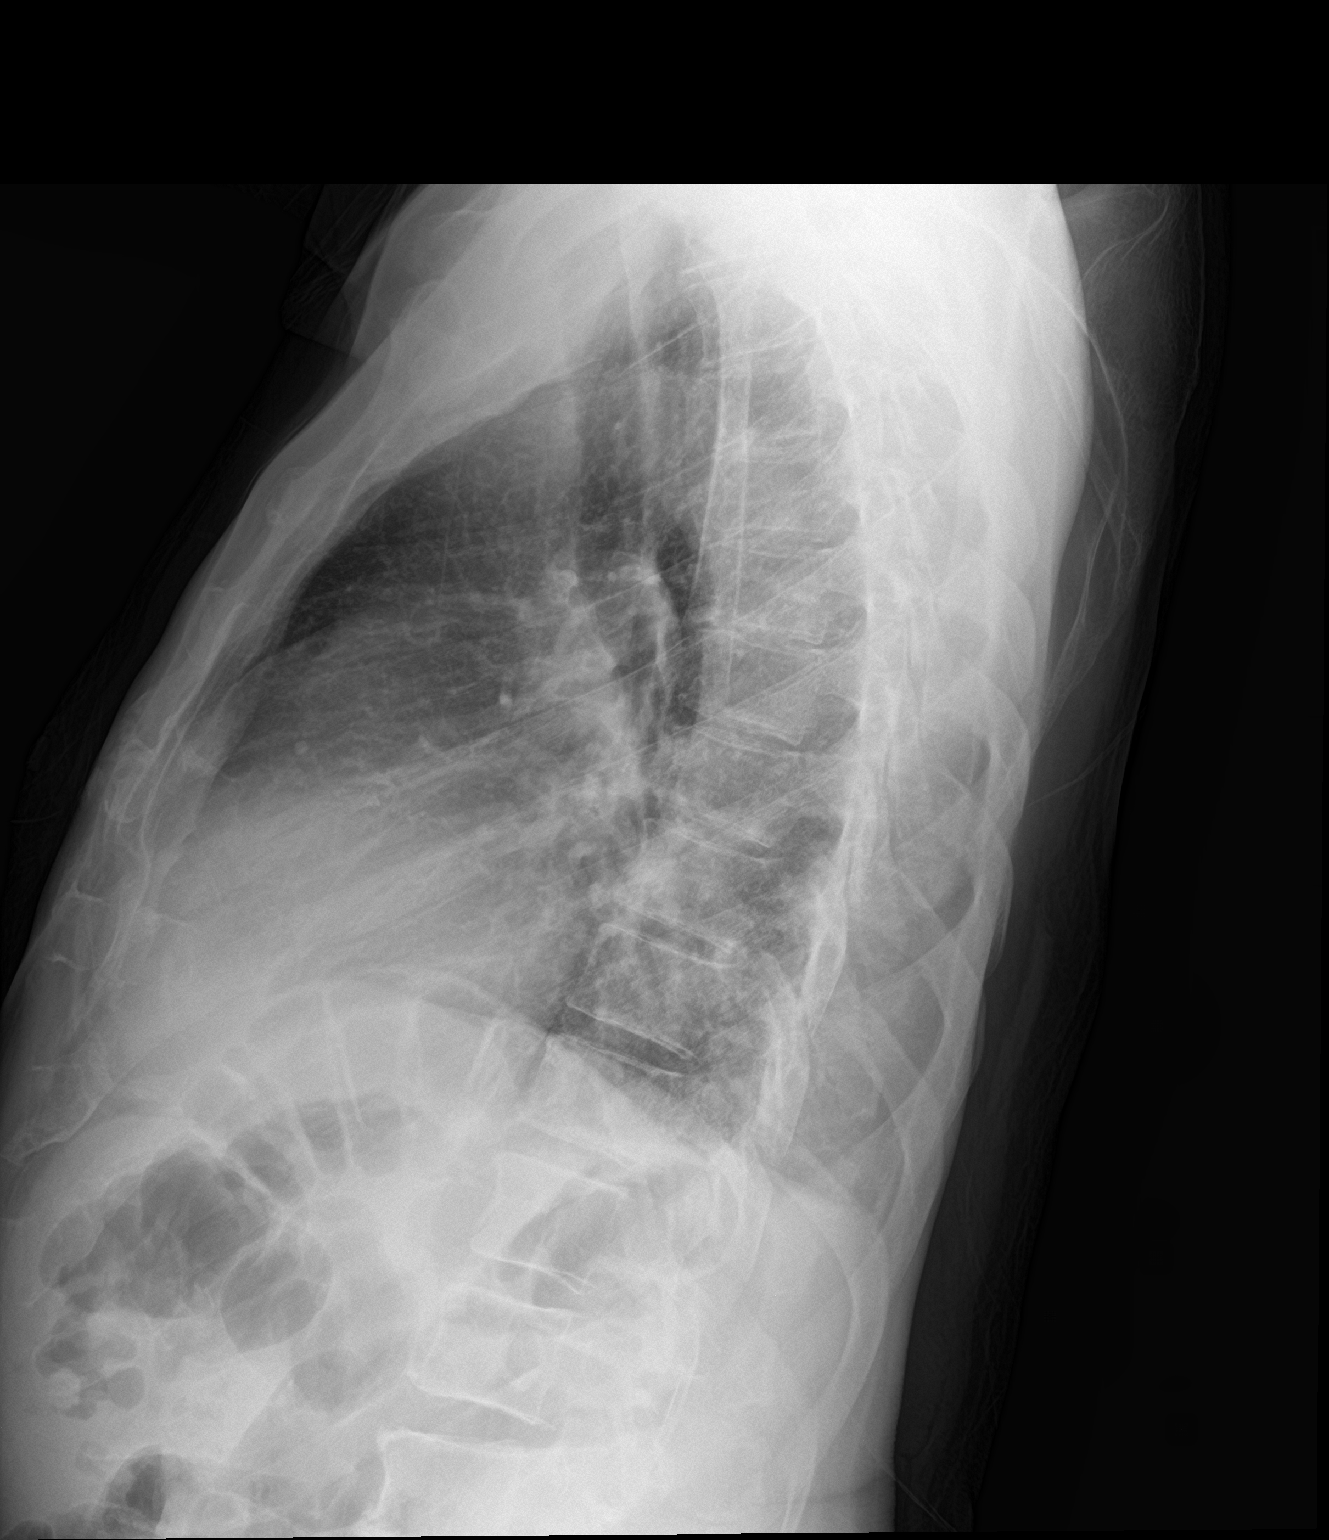

[2 of 2 positions shown; findings below may reference images not displayed]

FINDINGS: Upper normal heart size.

Atherosclerotic calcification aorta.

Mediastinal contours and pulmonary vascularity normal.

Lungs clear.

No pleural effusion or pneumothorax.

Bones unremarkable.
IMPRESSION: No acute abnormalities.

Aortic atherosclerosis.

## 2018-04-14 DIAGNOSIS — R972 Elevated prostate specific antigen [PSA]: Secondary | ICD-10-CM | POA: Diagnosis not present

## 2018-04-22 DIAGNOSIS — N401 Enlarged prostate with lower urinary tract symptoms: Secondary | ICD-10-CM | POA: Diagnosis not present

## 2018-04-22 DIAGNOSIS — R351 Nocturia: Secondary | ICD-10-CM | POA: Diagnosis not present

## 2018-04-22 DIAGNOSIS — R972 Elevated prostate specific antigen [PSA]: Secondary | ICD-10-CM | POA: Diagnosis not present

## 2018-05-07 DIAGNOSIS — H34831 Tributary (branch) retinal vein occlusion, right eye, with macular edema: Secondary | ICD-10-CM | POA: Diagnosis not present

## 2018-05-14 ENCOUNTER — Other Ambulatory Visit: Payer: Self-pay | Admitting: Cardiovascular Disease

## 2018-05-20 ENCOUNTER — Encounter: Payer: Self-pay | Admitting: Cardiology

## 2018-05-20 ENCOUNTER — Ambulatory Visit (INDEPENDENT_AMBULATORY_CARE_PROVIDER_SITE_OTHER): Payer: Medicare HMO | Admitting: Cardiology

## 2018-05-20 VITALS — BP 152/94 | HR 39 | Ht 71.0 in | Wt 154.0 lb

## 2018-05-20 DIAGNOSIS — R931 Abnormal findings on diagnostic imaging of heart and coronary circulation: Secondary | ICD-10-CM | POA: Diagnosis not present

## 2018-05-20 DIAGNOSIS — R001 Bradycardia, unspecified: Secondary | ICD-10-CM | POA: Diagnosis not present

## 2018-05-20 DIAGNOSIS — I482 Chronic atrial fibrillation, unspecified: Secondary | ICD-10-CM | POA: Diagnosis not present

## 2018-05-20 DIAGNOSIS — E785 Hyperlipidemia, unspecified: Secondary | ICD-10-CM

## 2018-05-20 DIAGNOSIS — Z7901 Long term (current) use of anticoagulants: Secondary | ICD-10-CM | POA: Diagnosis not present

## 2018-05-20 LAB — BASIC METABOLIC PANEL
BUN/Creatinine Ratio: 14 (ref 10–24)
BUN: 15 mg/dL (ref 8–27)
CO2: 23 mmol/L (ref 20–29)
Calcium: 9.4 mg/dL (ref 8.6–10.2)
Chloride: 108 mmol/L — ABNORMAL HIGH (ref 96–106)
Creatinine, Ser: 1.11 mg/dL (ref 0.76–1.27)
GFR calc Af Amer: 71 mL/min/{1.73_m2} (ref >59)
GFR calc non Af Amer: 62 mL/min/{1.73_m2} (ref >59)
Glucose: 64 mg/dL — ABNORMAL LOW (ref 65–99)
Potassium: 4.3 mmol/L (ref 3.5–5.2)
Sodium: 144 mmol/L (ref 134–144)

## 2018-05-20 LAB — DIGOXIN LEVEL: Digoxin, Serum: 0.9 ng/mL (ref 0.5–0.9)

## 2018-05-20 NOTE — Assessment & Plan Note (Signed)
CHADS VASC=3- on Eliquis

## 2018-05-20 NOTE — Assessment & Plan Note (Signed)
July 2019- normal LV size and function with a speckled appearance in his myocardium suggestive of amyloid however he is completely asymptomatic

## 2018-05-20 NOTE — Assessment & Plan Note (Signed)
Previously on Lanoxin, recently Toprol added. He now has regularization of atrial fibrillation suggestive a Dig toxicity.

## 2018-05-20 NOTE — Assessment & Plan Note (Signed)
Followed by PCP

## 2018-05-20 NOTE — Patient Instructions (Addendum)
Medication Instructions:  STOP Digoxin  If you need a refill on your cardiac medications before your next appointment, please call your pharmacy.   Lab work: Your physician recommends that you return for lab work in: TODAY-BMET, DIG LEVEL  If you have labs (blood work) drawn today and your tests are completely normal, you will receive your results only by: [name] MyChart Message (if you have MyChart) OR . A paper copy in the mail If you have any lab test that is abnormal or we need to change your treatment, we will call you to review the results.  Testing/Procedures: Your physician has recommended that you wear a 48 hour holter monitor. Holter monitors are medical devices that record the heart's electrical activity. Doctors most often use these monitors to diagnose arrhythmias. Arrhythmias are problems with the speed or rhythm of the heartbeat. The monitor is a small, portable device. You can wear one while you do your normal daily activities. This is usually used to diagnose what is causing palpitations/syncope (passing out). [place]  Follow-Up: At [facility], you and your health needs are our priority.  As part of our continuing mission to provide you with exceptional heart care, we have created designated Provider Care Teams.  These Care Teams include your primary Cardiologist (physician) and Advanced Practice Providers (APPs -  Physician Assistants and Nurse Practitioners) who all work together to provide you with the care you need, when you need it.  . Your physician recommends that you schedule a follow-up appointment in: [place].  Any Other Special Instructions Will Be Listed Below (If Applicable).

## 2018-05-20 NOTE — Progress Notes (Signed)
05/20/2018 Randy Fuller [place]   12-Mar-1934  [id number]  Primary Physician Derinda Late, MD Primary Cardiologist: Dr Gwenlyn Found  HPI: The patient is a pleasant 85 year old thin African-American male followed by Dr. Gwenlyn Found.  He has chronic atrial fibrillation.  Previously he has been controlled with Lanoxin alone.  He is on Eliquis, his CHA DS VASC is 3.  Recently Toprol 25 mg was added by his PCP, the patient is not sure why.  Today in the office his heart rate is 38.  He admits he has had fatigue but he denies any syncope or presyncope.  Previous echocardiogram done in July 2019 showed normal LV size and function with a speckled appearance in his myocardium suggestive of amyloid however Dr. Gwenlyn Found felt the patient was asymptomatic therefore further evaluation was not pursued.  Renal function done by Dr. Sandi Mariscal in June was normal. Prior lanoxin levels have been WNL.    Current Outpatient Medications  Medication Sig Dispense Refill  . allopurinol (ZYLOPRIM) 300 MG tablet Take 450 mg by mouth daily.    . Cholecalciferol 1000 UNITS capsule Take 1,000 Units by mouth daily.    . dorzolamide-timolol (COSOPT) 22.3-6.8 MG/ML ophthalmic solution Place 1 drop into both eyes 2 (two) times daily.     [name] ELIQUIS 5 MG TABS tablet TAKE 1 TABLET TWICE A DAY 180 tablet 0  . finasteride (PROSCAR) 5 MG tablet Take 5 mg by mouth daily.    [name] latanoprost (XALATAN) 0.005 % ophthalmic solution Place 1 drop into both eyes daily.    [name] losartan (COZAAR) 25 MG tablet Take 25 mg by mouth daily.    . Multiple Vitamins-Minerals (CENTRUM SILVER PO) Take 1 tablet by mouth daily.     . rosuvastatin (CRESTOR) 40 MG tablet Take 1 tablet (40 mg total) by mouth daily. 90 tablet 3  . sildenafil (VIAGRA) 100 MG tablet Take 100 mg by mouth as needed.     . Tamsulosin HCl (FLOMAX) 0.4 MG CAPS Take 0.4 mg by mouth.    . triamcinolone ointment (KENALOG) 0.1 % Apply 1 application topically 2 (two) times daily as needed (DRY SKIN).     [name]  amLODipine (NORVASC) 2.5 MG tablet Take 1 tablet (2.5 mg total) by mouth daily. 90 tablet 3   No current facility-administered medications for this visit.     Allergies  Allergen Reactions  . Pork-Derived Products Swelling    Past Medical History:  Diagnosis Date  . Anemia   . Arthritis   . Atrial fibrillation ([place])    on Coumadin but on hold for surgery bridging with lovenox  . Atrial fibrillation ([place])    also takes Digoxin daily  . Benign prostate hyperplasia   . Bilateral pneumonia   . Bruises easily    d/t being on Coumadin  . Cataract   . Colon polyps    2003  . Diverticulosis   . Dry skin    uses Kenalog cream as needed  . ED (erectile dysfunction)   . Elevated LFTs   . Enlarged prostate    takes Flomax daily  . Esophageal stricture   . Esophagitis   . Fatty liver   . GERD (gastroesophageal reflux disease)    takes Prilosec daily  . Glaucoma   . Gout    just stopped medication this week per Medical Md(was on Allopurinol)  . Hearing loss   . Hernia   . Hyperlipidemia    takes Atorvastatin every other day  . Impaired glucose tolerance   .  Internal hemorrhoids   . Irregular heart beat   . Nocturia   . Vitamin D deficiency     Social History   Socioeconomic History  . Marital status: Married    Spouse name: Not on file  . Number of children: Not on file  . Years of education: Not on file  . Highest education level: Not on file  Occupational History  . 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 Financial resource strain: Not on file  . Food insecurity:    Worry: Not on file    Inability: Not on file  . Transportation needs:    Medical: Not on file    Non-medical: Not on file  Tobacco Use  . Smoking status: Never Smoker  . Smokeless tobacco: Never Used  Substance and Sexual Activity  . Alcohol use: No  . Drug use: No  . Sexual activity: Not on file  Lifestyle  . Physical activity:    Days per week: Not on file     Minutes per session: Not on file  . Stress: Not on file  Relationships  . Social connections:    Talks on phone: Not on file    Gets together: Not on file    Attends religious service: Not on file    Active member of club or organization: Not on file    Attends meetings of clubs or organizations: Not on file    Relationship status: Not on file  . Intimate partner violence:    Fear of current or ex partner: Not on file    Emotionally abused: Not on file    Physically abused: Not on file    Forced sexual activity: Not on file  Other Topics Concern  . Not on file  Social History Narrative  . Not on file     Family History  Problem Relation Age of Onset  . Heart disease Sister   . Heart disease Brother   . Cirrhosis Brother   . Cancer Sister   . Cancer Unknown        Sister/ not sure what kind     Review of Systems: General: negative for chills, fever, night sweats or weight changes.  Cardiovascular: negative for chest pain, edema, orthopnea, palpitations, paroxysmal nocturnal dyspnea or shortness of breath Dermatological: negative for rash Respiratory: negative for cough or wheezing Urologic: negative for hematuria Abdominal: negative for nausea, vomiting, diarrhea, bright red blood per rectum, melena, or hematemesis Neurologic: negative for visual changes, syncope, or dizziness All other systems reviewed and are otherwise negative except as noted above.    Blood pressure (!) 152/94, pulse (!) 39, height 5\' 11"  (1.803 m), weight 154 lb (69.9 kg).  General appearance: alert, cooperative, no distress and thin Lungs: clear to auscultation bilaterally Heart: regular rate and rhythm Extremities: no edema Skin: Skin color, texture, turgor normal. No rashes or lesions Neurologic: Grossly normal  EKG Regularization of atrial fibrillation with LVH-rate 38  ASSESSMENT AND PLAN:   Atrial fibrillation, chronic Previously on Lanoxin, recently Toprol added. He now has  regularization of atrial fibrillation suggestive a Dig toxicity.   Chronic anticoagulation CHADS VASC=3- on Eliquis  Hyperlipidemia Followed by PCP  Abnormal echocardiogram July 2019- normal LV size and function with a speckled appearance in his myocardium suggestive of amyloid however he is completely asymptomatic   PLAN  Will document Dig level and BMP but plan on discontinuing Digoxin regardless of level. Continue Toprol 25 mg for  now.  Check 48 hour Holter next week.  F/U with Dr Gwenlyn Found in 3 months, sooner if Holter suggest that is needed.    Kerin Ransom PA-C 05/20/2018 9:32 AM

## 2018-06-01 ENCOUNTER — Ambulatory Visit (INDEPENDENT_AMBULATORY_CARE_PROVIDER_SITE_OTHER): Payer: Medicare HMO

## 2018-06-01 DIAGNOSIS — R001 Bradycardia, unspecified: Secondary | ICD-10-CM | POA: Diagnosis not present

## 2018-06-01 DIAGNOSIS — Z7901 Long term (current) use of anticoagulants: Secondary | ICD-10-CM | POA: Diagnosis not present

## 2018-06-17 ENCOUNTER — Telehealth: Payer: Self-pay

## 2018-06-17 ENCOUNTER — Encounter: Payer: Self-pay | Admitting: Cardiovascular Disease

## 2018-06-17 ENCOUNTER — Ambulatory Visit (INDEPENDENT_AMBULATORY_CARE_PROVIDER_SITE_OTHER): Payer: Medicare HMO | Admitting: Cardiovascular Disease

## 2018-06-17 VITALS — BP 161/80 | HR 56 | Ht 71.0 in | Wt 155.0 lb

## 2018-06-17 DIAGNOSIS — I10 Essential (primary) hypertension: Secondary | ICD-10-CM | POA: Diagnosis not present

## 2018-06-17 DIAGNOSIS — E782 Mixed hyperlipidemia: Secondary | ICD-10-CM

## 2018-06-17 DIAGNOSIS — I482 Chronic atrial fibrillation, unspecified: Secondary | ICD-10-CM | POA: Diagnosis not present

## 2018-06-17 DIAGNOSIS — R001 Bradycardia, unspecified: Secondary | ICD-10-CM | POA: Diagnosis not present

## 2018-06-17 MED ORDER — LOSARTAN POTASSIUM 50 MG PO TABS: 50.0000 mg | ORAL_TABLET | Freq: Every day | ORAL | 3 refills | Status: DC

## 2018-06-17 NOTE — Assessment & Plan Note (Signed)
History of chronic A. fib on Eliquis oral anticoagulation with recent event monitor that showed slow ventricular response in the 30 and 40 bpm range.  He does feel somewhat fatigued but has had no presyncope.  He is currently on no rate slowing drugs.  I am referring him to EP for further evaluation.

## 2018-06-17 NOTE — Addendum Note (Signed)
Addended by: Annita Brod on: 06/17/2018 02:33 PM   Modules accepted: Orders

## 2018-06-17 NOTE — Patient Instructions (Addendum)
Medication Instructions:  Your physician has recommended you make the following change in your medication: [place] (COZAAR) TO 50 MG BY MOUTH DAILY.  If you need a refill on your cardiac medications before your next appointment, please call your pharmacy.   Lab work: Your physician recommends that you return for lab work in: 2 WEEKS (BMP)  If you have labs (blood work) drawn today and your tests are completely normal, you will receive your results only by: [name] MyChart Message (if you have MyChart) OR . A paper copy in the mail If you have any lab test that is abnormal or we need to change your treatment, we will call you to review the results.  Testing/Procedures: NONE  Follow-Up: At [facility], you and your health needs are our priority.  As part of our continuing mission to provide you with exceptional heart care, we have created designated Provider Care Teams.  These Care Teams include your primary Cardiologist (physician) and Advanced Practice Providers (APPs -  Physician Assistants and Nurse Practitioners) who all work together to provide you with the care you need, when you need it. . You will need a follow up appointment in 3 months with Kerin Ransom, PA-C and in 6 months with Dr. Gwenlyn Found.  Please call our office 2 months in advance to schedule this appointment.  You may see one of the following Advanced Practice Providers on your designated Care Team:   . Kerin Ransom, [place] . Almyra Deforest, PA-C . Fabian Sharp, PA-C . Jory Sims, DNP . Rosaria Ferries, PA-C . Roby Lofts, PA-C . Sande Rives, PA-C  Any Other Special Instructions Will Be Listed Below (If Applicable). KEEP A DAILY BLOOD PRESSURE LOG FOR 30 DAYS THEN FOLLOW UP WITH A CLINICAL PHARMACIST IN THE HYPERTENSION CLINIC. YOU WILL NEED AN APPOINTMENT.  REFERRAL TO ELECTROPHYSIOLOGY FOR BRADYCARDIA

## 2018-06-17 NOTE — Progress Notes (Signed)
06/17/2018 Randy Fuller   06-26-1933  [id number]  Primary Physician Derinda Late, MD Primary Cardiologist: Lorretta Harp MD [name], [place]  HPI:  Randy Fuller is a 85 y.o.   thin-appearing, married [place] American male, father of 34, grandfather to 42 grandchildren who I sawin the office  11/21/2017.Randy Fuller He has a history of paroxysmal A-fib in the past, now chronic A-fib onEliquis oralanticoagulation. His other problems include hyperlipidemia. He is entirely asymptomatic. Dr. Sandi Mariscal follows his lipid profile.his Crestor was increased from 20-40 mg a day by Dr. Sandi Mariscal  Since I saw him back one year ago he denies chest pain or shortness of breath.  He did have a Myoview ordered by Dr. Georgena Spurling 07/08/2017 which was nonischemic and low risk.  A 2D echocardiogram performed 10/10/2017 showed normal LV size and function with a speckled appearance in his myocardium suggestive of amyloid however he is completely asymptomatic and therefore I will not pursue a pyrophosphate scan or cardiac MRI.  Since I saw him in the office he has seen Kerin Ransom, PA-C in the office 06/18/2018 was sent complaints of fatigue.  His heart rate at that time was 38.  An event monitor showed A. fib with a slow ventricular response in the 30s and 40s on occasion.  He denies syncope or presyncope.   Current Meds  Medication Sig  . allopurinol (ZYLOPRIM) 300 MG tablet Take 450 mg by mouth daily.  . Cholecalciferol 1000 UNITS capsule Take 1,000 Units by mouth daily.  . dorzolamide-timolol (COSOPT) 22.3-6.8 MG/ML ophthalmic solution Place 1 drop into both eyes 2 (two) times daily.   Randy Fuller ELIQUIS 5 MG TABS tablet TAKE 1 TABLET TWICE A DAY  . finasteride (PROSCAR) 5 MG tablet Take 5 mg by mouth daily.  Randy Fuller latanoprost (XALATAN) 0.005 % ophthalmic solution Place 1 drop into both eyes daily.  Randy Fuller losartan (COZAAR) 25 MG tablet Take 25 mg by mouth daily.  . Multiple Vitamins-Minerals (CENTRUM SILVER PO) Take  1 tablet by mouth daily.   . rosuvastatin (CRESTOR) 40 MG tablet Take 1 tablet (40 mg total) by mouth daily.  . sildenafil (VIAGRA) 100 MG tablet Take 100 mg by mouth as needed.   . Tamsulosin HCl (FLOMAX) 0.4 MG CAPS Take 0.4 mg by mouth.  . triamcinolone ointment (KENALOG) 0.1 % Apply 1 application topically 2 (two) times daily as needed (DRY SKIN).      Allergies  Allergen Reactions  . Pork-Derived Products Swelling    Social History   Socioeconomic History  . Marital status: Married    Spouse name: Not on file  . Number of children: Not on file  . Years of education: Not on file  . Highest education level: Not on file  Occupational History  . 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 Financial resource strain: Not on file  . Food insecurity:    Worry: Not on file    Inability: Not on file  . Transportation needs:    Medical: Not on file    Non-medical: Not on file  Tobacco Use  . Smoking status: Never Smoker  . Smokeless tobacco: Never Used  Substance and Sexual Activity  . Alcohol use: No  . Drug use: No  . Sexual activity: Not on file  Lifestyle  . Physical activity:    Days per week: Not on file    Minutes per session: Not on file  . Stress: Not on file  Relationships  . Social connections:    Talks on phone: Not on file    Gets together: Not on file    Attends religious service: Not on file    Active member of club or organization: Not on file    Attends meetings of clubs or organizations: Not on file    Relationship status: Not on file  . Intimate partner violence:    Fear of current or ex partner: Not on file    Emotionally abused: Not on file    Physically abused: Not on file    Forced sexual activity: Not on file  Other Topics Concern  . Not on file  Social History Narrative  . Not on file     Review of Systems: General: negative for chills, fever, night sweats or weight changes.  Cardiovascular: negative for chest  pain, dyspnea on exertion, edema, orthopnea, palpitations, paroxysmal nocturnal dyspnea or shortness of breath Dermatological: negative for rash Respiratory: negative for cough or wheezing Urologic: negative for hematuria Abdominal: negative for nausea, vomiting, diarrhea, bright red blood per rectum, melena, or hematemesis Neurologic: negative for visual changes, syncope, or dizziness All other systems reviewed and are otherwise negative except as noted above.    Blood pressure (!) 161/80, pulse (!) 56, height 5\' 11"  (1.803 m), weight 155 lb (70.3 kg), SpO2 98 %.  General appearance: alert and no distress Neck: no adenopathy, no carotid bruit, no JVD, supple, symmetrical, trachea midline and thyroid not enlarged, symmetric, no tenderness/mass/nodules Lungs: clear to auscultation bilaterally Heart: irregularly irregular rhythm Extremities: extremities normal, atraumatic, no cyanosis or edema Pulses: 2+ and symmetric Skin: Skin color, texture, turgor normal. No rashes or lesions Neurologic: Alert and oriented X 3, normal strength and tone. Normal symmetric reflexes. Normal coordination and gait  EKG not performed today  ASSESSMENT AND PLAN:   Atrial fibrillation, chronic History of chronic A. fib on Eliquis oral anticoagulation with recent event monitor that showed slow ventricular response in the 30 and 40 bpm range.  He does feel somewhat fatigued but has had no presyncope.  He is currently on no rate slowing drugs.  I am referring him to EP for further evaluation.  Hyperlipidemia History of hyperlipidemia on statin therapy followed by his PCP      Lorretta Harp MD [facility], [facility] 06/17/2018 2:01 PM

## 2018-06-17 NOTE — Assessment & Plan Note (Signed)
History of hyperlipidemia on statin therapy followed by his PCP 

## 2018-06-25 NOTE — Telephone Encounter (Signed)
Error~ Encounter closed.

## 2018-07-01 DIAGNOSIS — I10 Essential (primary) hypertension: Secondary | ICD-10-CM | POA: Diagnosis not present

## 2018-07-02 LAB — BASIC METABOLIC PANEL
BUN / CREAT RATIO: 14 (ref 10–24)
BUN: 18 mg/dL (ref 8–27)
CO2: 25 mmol/L (ref 20–29)
CREATININE: 1.3 mg/dL — AB (ref 0.76–1.27)
Calcium: 9.7 mg/dL (ref 8.6–10.2)
Chloride: 108 mmol/L — ABNORMAL HIGH (ref 96–106)
GFR calc Af Amer: 58 mL/min/{1.73_m2} — ABNORMAL LOW (ref >59)
GFR calc non Af Amer: 50 mL/min/{1.73_m2} — ABNORMAL LOW (ref >59)
GLUCOSE: 87 mg/dL (ref 65–99)
Potassium: 4.5 mmol/L (ref 3.5–5.2)
Sodium: 146 mmol/L — ABNORMAL HIGH (ref 134–144)

## 2018-07-09 ENCOUNTER — Other Ambulatory Visit: Payer: Self-pay

## 2018-07-09 DIAGNOSIS — H401190 Primary open-angle glaucoma, unspecified eye, stage unspecified: Secondary | ICD-10-CM | POA: Diagnosis not present

## 2018-07-09 DIAGNOSIS — H34831 Tributary (branch) retinal vein occlusion, right eye, with macular edema: Secondary | ICD-10-CM | POA: Diagnosis not present

## 2018-07-09 DIAGNOSIS — Z79899 Other long term (current) drug therapy: Secondary | ICD-10-CM

## 2018-07-09 NOTE — Progress Notes (Signed)
Notes recorded by Lorretta Harp, MD on 07/02/2018 at 8:34 AM EDT Labs okay except for mildly elevated serum creatinine. Potentially dehydrated. Repeat in 2 to 3 weeks.

## 2018-07-17 ENCOUNTER — Telehealth: Payer: Self-pay | Admitting: Internal Medicine

## 2018-07-17 NOTE — Telephone Encounter (Signed)
New Message            Patient is calling today to see if April 30th is ok to come for labs? Ps call to confirm.

## 2018-07-20 ENCOUNTER — Other Ambulatory Visit: Payer: Self-pay

## 2018-07-20 DIAGNOSIS — Z79899 Other long term (current) drug therapy: Secondary | ICD-10-CM

## 2018-07-20 NOTE — Telephone Encounter (Signed)
[place] for labs today-left detailed message CB for any questions

## 2018-07-21 DIAGNOSIS — Z79899 Other long term (current) drug therapy: Secondary | ICD-10-CM | POA: Diagnosis not present

## 2018-07-21 LAB — BASIC METABOLIC PANEL
BUN/Creatinine Ratio: 15 (ref 10–24)
BUN: 18 mg/dL (ref 8–27)
CO2: 23 mmol/L (ref 20–29)
Calcium: 9.7 mg/dL (ref 8.6–10.2)
Chloride: 105 mmol/L (ref 96–106)
Creatinine, Ser: 1.22 mg/dL (ref 0.76–1.27)
GFR calc Af Amer: 63 mL/min/{1.73_m2} (ref >59)
GFR calc non Af Amer: 54 mL/min/{1.73_m2} — ABNORMAL LOW (ref >59)
Glucose: 85 mg/dL (ref 65–99)
Potassium: 4.3 mmol/L (ref 3.5–5.2)
Sodium: 145 mmol/L — ABNORMAL HIGH (ref 134–144)

## 2018-07-22 ENCOUNTER — Ambulatory Visit: Payer: Medicare HMO

## 2018-07-27 ENCOUNTER — Telehealth: Payer: Self-pay | Admitting: Pharmacist Clinician (PhC)/ Clinical Pharmacy Specialist

## 2018-07-27 NOTE — Telephone Encounter (Signed)
Unable to see patient in office, so spoke by phone today regarding his home blood pressure readings.  Patient had left list of readings for past 4-5 weeks at the office several days ago, so I was able to review those before calling him this morning.  Our conversation lasted approximately 18 minutes.    Home readings   AM - average 147/84  HR 47  (range 107-173/69-106)  PM - average 141/81  HR 50  (range 111-167/64-105)  This morning Randy Fuller reports that he has been feeling unwell for the past 18 hours, developed GI symptoms after eating chicken sausage yesterday.  Offered to call him at another time when he was feeling better, but he stated he was fine to talk this morning.  He also complains of ongoing problems with gout, believes caused by his medications.  When we reviewed his diet, it was determined that he eats fish, including shrimp on a regular basis.  He also enjoys red meats.  He states he has Internet access at home, so I advised that he and his wife do some research on foods that can increase gout flares.    He does not know much of his family history when it comes to heart disease.  He was one of 10 children and moved away from home at the age of 42.  He does recall that one brother had a pacemaker, and has 2 remaining siblings, but does not know much of their health issues.  He has 3 sons, all whom report no cardiac issues.    Randy Fuller stays active during the day.  While he gets no formal exercise, he lives on [place] acres and tends to his yard and gardens regularly.  [name]  His current medications for blood pressure include losartan 50 mg and metoprolol succinate 25 mg, both of which he takes every morning.  He was prescribed amlodipine 2.5 mg by Rosaria Ferries PA about a year ago, states he only took it for a month and then stopped.  Unsure of why, although he may have associated it with a gout flare.    3.81.82 - metabolic panel  Na 993, K 4.3, Glu 85, BUN 18, SCr 1.22, GFR AA  63  Today I recommended that Randy Fuller cut his metoprolol succ to 12.5 mg daily in hopes of increasing his heart rate some.  I will also have him re-start the amlodipine 2.5 mg once daily in the evenings.  I suspect he will need 5-10 mg of the amlodipine, but he still has some 2.5 mg tablets, so will have him start with this.  I will reach out to him in 3-4 weeks to see how his BP and heart rate are doing.  He was also encouraged to modify his diet for the next several weeks to see if that would have any impact on his ongoing gout flares.

## 2018-07-29 DIAGNOSIS — R109 Unspecified abdominal pain: Secondary | ICD-10-CM | POA: Diagnosis not present

## 2018-07-29 DIAGNOSIS — R1084 Generalized abdominal pain: Secondary | ICD-10-CM | POA: Diagnosis not present

## 2018-07-30 ENCOUNTER — Encounter (HOSPITAL_COMMUNITY): Payer: Self-pay

## 2018-07-30 ENCOUNTER — Inpatient Hospital Stay (HOSPITAL_COMMUNITY): Payer: Medicare HMO

## 2018-07-30 ENCOUNTER — Other Ambulatory Visit: Payer: Self-pay

## 2018-07-30 ENCOUNTER — Emergency Department (HOSPITAL_COMMUNITY): Payer: Medicare HMO

## 2018-07-30 ENCOUNTER — Inpatient Hospital Stay (HOSPITAL_COMMUNITY)
Admission: EM | Admit: 2018-07-30 | Discharge: 2018-08-05 | DRG: 389 | Disposition: A | Discharge status: Home Health | Payer: Medicare HMO | Attending: Family Medicine | Admitting: Family Medicine

## 2018-07-30 DIAGNOSIS — H409 Unspecified glaucoma: Secondary | ICD-10-CM | POA: Diagnosis not present

## 2018-07-30 DIAGNOSIS — Z86010 Personal history of colonic polyps: Secondary | ICD-10-CM | POA: Diagnosis not present

## 2018-07-30 DIAGNOSIS — E875 Hyperkalemia: Secondary | ICD-10-CM | POA: Diagnosis present

## 2018-07-30 DIAGNOSIS — N529 Male erectile dysfunction, unspecified: Secondary | ICD-10-CM | POA: Diagnosis present

## 2018-07-30 DIAGNOSIS — M1990 Unspecified osteoarthritis, unspecified site: Secondary | ICD-10-CM | POA: Diagnosis not present

## 2018-07-30 DIAGNOSIS — R3911 Hesitancy of micturition: Secondary | ICD-10-CM | POA: Diagnosis not present

## 2018-07-30 DIAGNOSIS — R2689 Other abnormalities of gait and mobility: Secondary | ICD-10-CM | POA: Diagnosis not present

## 2018-07-30 DIAGNOSIS — N401 Enlarged prostate with lower urinary tract symptoms: Secondary | ICD-10-CM | POA: Diagnosis not present

## 2018-07-30 DIAGNOSIS — Z91018 Allergy to other foods: Secondary | ICD-10-CM

## 2018-07-30 DIAGNOSIS — H9190 Unspecified hearing loss, unspecified ear: Secondary | ICD-10-CM | POA: Diagnosis present

## 2018-07-30 DIAGNOSIS — E559 Vitamin D deficiency, unspecified: Secondary | ICD-10-CM | POA: Diagnosis present

## 2018-07-30 DIAGNOSIS — Z0189 Encounter for other specified special examinations: Secondary | ICD-10-CM

## 2018-07-30 DIAGNOSIS — E870 Hyperosmolality and hypernatremia: Secondary | ICD-10-CM | POA: Diagnosis not present

## 2018-07-30 DIAGNOSIS — I129 Hypertensive chronic kidney disease with stage 1 through stage 4 chronic kidney disease, or unspecified chronic kidney disease: Secondary | ICD-10-CM | POA: Diagnosis present

## 2018-07-30 DIAGNOSIS — R339 Retention of urine, unspecified: Secondary | ICD-10-CM | POA: Diagnosis present

## 2018-07-30 DIAGNOSIS — Z4682 Encounter for fitting and adjustment of non-vascular catheter: Secondary | ICD-10-CM | POA: Diagnosis not present

## 2018-07-30 DIAGNOSIS — K5669 Other partial intestinal obstruction: Secondary | ICD-10-CM | POA: Diagnosis not present

## 2018-07-30 DIAGNOSIS — Z8249 Family history of ischemic heart disease and other diseases of the circulatory system: Secondary | ICD-10-CM

## 2018-07-30 DIAGNOSIS — K56699 Other intestinal obstruction unspecified as to partial versus complete obstruction: Secondary | ICD-10-CM | POA: Diagnosis not present

## 2018-07-30 DIAGNOSIS — M109 Gout, unspecified: Secondary | ICD-10-CM | POA: Diagnosis present

## 2018-07-30 DIAGNOSIS — Z79899 Other long term (current) drug therapy: Secondary | ICD-10-CM | POA: Diagnosis not present

## 2018-07-30 DIAGNOSIS — K219 Gastro-esophageal reflux disease without esophagitis: Secondary | ICD-10-CM | POA: Diagnosis present

## 2018-07-30 DIAGNOSIS — K598 Other specified functional intestinal disorders: Secondary | ICD-10-CM | POA: Diagnosis not present

## 2018-07-30 DIAGNOSIS — K56609 Unspecified intestinal obstruction, unspecified as to partial versus complete obstruction: Secondary | ICD-10-CM | POA: Diagnosis not present

## 2018-07-30 DIAGNOSIS — N183 Chronic kidney disease, stage 3 (moderate): Secondary | ICD-10-CM | POA: Diagnosis present

## 2018-07-30 DIAGNOSIS — K760 Fatty (change of) liver, not elsewhere classified: Secondary | ICD-10-CM | POA: Diagnosis not present

## 2018-07-30 DIAGNOSIS — E785 Hyperlipidemia, unspecified: Secondary | ICD-10-CM | POA: Diagnosis present

## 2018-07-30 DIAGNOSIS — Z7901 Long term (current) use of anticoagulants: Secondary | ICD-10-CM

## 2018-07-30 DIAGNOSIS — E876 Hypokalemia: Secondary | ICD-10-CM | POA: Diagnosis not present

## 2018-07-30 DIAGNOSIS — I4891 Unspecified atrial fibrillation: Secondary | ICD-10-CM | POA: Diagnosis not present

## 2018-07-30 DIAGNOSIS — I482 Chronic atrial fibrillation, unspecified: Secondary | ICD-10-CM

## 2018-07-30 LAB — CBC WITH DIFFERENTIAL/PLATELET
Abs Immature Granulocytes: 0.02 10*3/uL (ref 0.00–0.07)
Basophils Absolute: 0 10*3/uL (ref 0.0–0.1)
Basophils Relative: 1 %
Eosinophils Absolute: 0 10*3/uL (ref 0.0–0.5)
Eosinophils Relative: 0 %
HCT: 49 % (ref 39.0–52.0)
Hemoglobin: 16.1 g/dL (ref 13.0–17.0)
Immature Granulocytes: 1 %
Lymphocytes Relative: 23 %
Lymphs Abs: 1 10*3/uL (ref 0.7–4.0)
MCH: 30.2 pg (ref 26.0–34.0)
MCHC: 32.9 g/dL (ref 30.0–36.0)
MCV: 91.9 fL (ref 80.0–100.0)
Monocytes Absolute: 0.8 10*3/uL (ref 0.1–1.0)
Monocytes Relative: 17 %
Neutro Abs: 2.6 10*3/uL (ref 1.7–7.7)
Neutrophils Relative %: 58 %
Platelets: 140 10*3/uL — ABNORMAL LOW (ref 150–400)
RBC: 5.33 MIL/uL (ref 4.22–5.81)
RDW: 15.5 % (ref 11.5–15.5)
WBC: 4.4 10*3/uL (ref 4.0–10.5)
nRBC: 0 % (ref 0.0–0.2)

## 2018-07-30 LAB — URINALYSIS, ROUTINE W REFLEX MICROSCOPIC
Bilirubin Urine: NEGATIVE
Glucose, UA: NEGATIVE mg/dL
Ketones, ur: NEGATIVE mg/dL
Leukocytes,Ua: NEGATIVE
Nitrite: NEGATIVE
Protein, ur: 100 mg/dL — AB
Specific Gravity, Urine: 1.021 (ref 1.005–1.030)
pH: 5 (ref 5.0–8.0)

## 2018-07-30 LAB — LIPASE, BLOOD: Lipase: 66 U/L — ABNORMAL HIGH (ref 11–51)

## 2018-07-30 LAB — COMPREHENSIVE METABOLIC PANEL
ALT: 16 U/L (ref 0–44)
AST: 54 U/L — ABNORMAL HIGH (ref 15–41)
Albumin: 4.1 g/dL (ref 3.5–5.0)
Alkaline Phosphatase: 85 U/L (ref 38–126)
Anion gap: 13 (ref 5–15)
BUN: 28 mg/dL — ABNORMAL HIGH (ref 8–23)
CO2: 26 mmol/L (ref 22–32)
Calcium: 10 mg/dL (ref 8.9–10.3)
Chloride: 100 mmol/L (ref 98–111)
Creatinine, Ser: 1.38 mg/dL — ABNORMAL HIGH (ref 0.61–1.24)
GFR calc Af Amer: 54 mL/min — ABNORMAL LOW (ref >60)
GFR calc non Af Amer: 47 mL/min — ABNORMAL LOW (ref >60)
Glucose, Bld: 138 mg/dL — ABNORMAL HIGH (ref 70–99)
Potassium: 5.2 mmol/L — ABNORMAL HIGH (ref 3.5–5.1)
Sodium: 139 mmol/L (ref 135–145)
Total Bilirubin: 2.1 mg/dL — ABNORMAL HIGH (ref 0.3–1.2)
Total Protein: 7.7 g/dL (ref 6.5–8.1)

## 2018-07-30 LAB — APTT: aPTT: 39 seconds — ABNORMAL HIGH (ref 24–36)

## 2018-07-30 MED ORDER — ARGATROBAN 50 MG/50ML IV SOLN
0.7000 ug/kg/min | INTRAVENOUS | Status: DC
  Administered 2018-07-30: 19:00:00 0.5 ug/kg/min via INTRAVENOUS
  Administered 2018-07-31: 07:00:00 0.7 ug/kg/min via INTRAVENOUS
  Filled 2018-07-30 (×3): qty 50

## 2018-07-30 MED ORDER — METOCLOPRAMIDE HCL 5 MG/ML IJ SOLN
10.0000 mg | Freq: Once | INTRAMUSCULAR | Status: AC
  Administered 2018-07-30: 14:00:00 10 mg via INTRAVENOUS
  Filled 2018-07-30: qty 2

## 2018-07-30 MED ORDER — LIDOCAINE HCL (PF) 1 % IJ SOLN
5.0000 mL | Freq: Once | INTRAMUSCULAR | Status: AC
  Administered 2018-07-30: 15:00:00 2 mL via INTRADERMAL
  Filled 2018-07-30: qty 5

## 2018-07-30 MED ORDER — LATANOPROST 0.005 % OP SOLN
1.0000 [drp] | Freq: Every day | OPHTHALMIC | Status: DC
  Administered 2018-07-30 – 2018-08-04 (×4): 1 [drp] via OPHTHALMIC
  Filled 2018-07-30: qty 2.5

## 2018-07-30 MED ORDER — ONDANSETRON HCL 4 MG/2ML IJ SOLN
4.0000 mg | Freq: Four times a day (QID) | INTRAMUSCULAR | Status: DC | PRN
  Administered 2018-08-01: 4 mg via INTRAVENOUS
  Filled 2018-07-30: qty 2

## 2018-07-30 MED ORDER — ACETAMINOPHEN 10 MG/ML IV SOLN
1000.0000 mg | Freq: Four times a day (QID) | INTRAVENOUS | Status: AC
  Administered 2018-07-30 – 2018-07-31 (×4): 1000 mg via INTRAVENOUS
  Filled 2018-07-30 (×4): qty 100

## 2018-07-30 MED ORDER — IOHEXOL 300 MG/ML  SOLN
100.0000 mL | Freq: Once | INTRAMUSCULAR | Status: AC | PRN
  Administered 2018-07-30: 100 mL via INTRAVENOUS

## 2018-07-30 MED ORDER — MORPHINE SULFATE (PF) 4 MG/ML IV SOLN
6.0000 mg | Freq: Once | INTRAVENOUS | Status: AC
  Administered 2018-07-30: 6 mg via INTRAVENOUS
  Filled 2018-07-30: qty 2

## 2018-07-30 MED ORDER — SODIUM CHLORIDE 0.9 % IV SOLN
INTRAVENOUS | Status: DC
  Administered 2018-07-30 – 2018-08-01 (×4): via INTRAVENOUS

## 2018-07-30 MED ORDER — ONDANSETRON HCL 4 MG/2ML IJ SOLN
4.0000 mg | Freq: Once | INTRAMUSCULAR | Status: AC
  Administered 2018-07-30: 10:00:00 4 mg via INTRAVENOUS
  Filled 2018-07-30: qty 2

## 2018-07-30 MED ORDER — DIATRIZOATE MEGLUMINE & SODIUM 66-10 % PO SOLN
90.0000 mL | Freq: Once | ORAL | Status: AC
  Administered 2018-07-30: 90 mL via NASOGASTRIC
  Filled 2018-07-30: qty 90

## 2018-07-30 MED ORDER — SODIUM CHLORIDE 0.9 % IV BOLUS
1000.0000 mL | Freq: Once | INTRAVENOUS | Status: AC
  Administered 2018-07-30: 10:00:00 1000 mL via INTRAVENOUS

## 2018-07-30 MED ORDER — DORZOLAMIDE HCL-TIMOLOL MAL 2-0.5 % OP SOLN
1.0000 [drp] | Freq: Two times a day (BID) | OPHTHALMIC | Status: DC
  Administered 2018-07-30 – 2018-08-05 (×12): 1 [drp] via OPHTHALMIC
  Filled 2018-07-30: qty 10

## 2018-07-30 MED ORDER — MORPHINE SULFATE (PF) 2 MG/ML IV SOLN
1.0000 mg | INTRAVENOUS | Status: DC | PRN
  Administered 2018-07-31 – 2018-08-01 (×2): 1 mg via INTRAVENOUS
  Filled 2018-07-30 (×2): qty 1

## 2018-07-30 NOTE — ED Notes (Signed)
Spoke with the pt's wife to give her an update.

## 2018-07-30 NOTE — ED Notes (Signed)
[place] D[place], wife [phone]

## 2018-07-30 NOTE — Consult Note (Signed)
Randy Fuller [birth year]  [id number].    Requesting MD: Dr. Jola Schmidt Chief Complaint/Reason for Consult: SBO  HPI:  This is a 85 yo black male with a history of A fib on Eliquis (last dose Tuesday) and HTN with BPH who had a laparoscopic left inguinal hernia repair with mesh in 2014 that was extraperitoneal.  He has not had any other abdominal surgery.  He began having abdominal pain and distention on Sunday.  He has had N/V.  He is unsure when he last past flatus, but stated he had a BM yesterday.  He denies any CP/SOB/fevers/or any other symptoms.  He came to the ED today due to persistent abdominal pain, N/V.  Nothing has helped his symptoms.    Upon arrival to the ED, he had a CT scan that revealed a high grade SBO.  His cbc was normal, but his BMET revealed a slightly increased K at 5.2 and cr of 1.38, which isn't too far off from his baseline around 1.1-1.2.  We have been asked to see him for further surgical recommendations.  ROS: ROS : Please see HPI, otherwise all other systems have been reviewed and are negative.  Family History  Problem Relation Age of Onset  . Heart disease Sister   . Heart disease Brother   . Cirrhosis Brother   . Cancer Sister   . Cancer Unknown        Sister/ not sure what kind    Past Medical History:  Diagnosis Date  . Anemia   . Arthritis   . Atrial fibrillation ([place])    on Coumadin but on hold for surgery bridging with lovenox  . Atrial fibrillation ([place])    also takes Digoxin daily  . Benign prostate hyperplasia   . Bilateral pneumonia   . Bruises easily    d/t being on Coumadin  . Cataract   . Colon polyps    2003  . Diverticulosis   . Dry skin    uses Kenalog cream as needed  . ED (erectile dysfunction)   . Elevated LFTs   . Enlarged prostate    takes Flomax daily  . Esophageal stricture   . Esophagitis   . Fatty liver   . GERD (gastroesophageal reflux disease)    takes Prilosec daily  . Glaucoma   . Gout    just stopped medication this week per Medical Md(was on Allopurinol)  . Hearing loss   . Hernia   . Hyperlipidemia    takes Atorvastatin every other day  . Impaired glucose tolerance   . Internal hemorrhoids   . Irregular heart beat   . Nocturia   . Vitamin D deficiency     Past Surgical History:  Procedure Laterality Date  . CATARACT EXTRACTION     Both eyes  . DOPPLER ECHOCARDIOGRAPHY  2003  . EYE SURGERY Left 2010   glaucoma  . EYE SURGERY Bilateral 2013   cataract extractions and lens implants  . HERNIA REPAIR Left 08/31/12   Left Inguinal Hernia Repair  . INGUINAL HERNIA REPAIR Left 08/31/2012   Procedure: LAPAROSCOPIC INGUINAL HERNIA;  Surgeon: Ralene Ok, MD;  Location: [facility];  Service: General;  Laterality: Left;  . INSERTION OF MESH Left 08/31/2012   Procedure: INSERTION OF MESH;  Surgeon: Ralene Ok, MD;  Location: [facility];  Service: General;  Laterality: Left;  . NM MYOVIEW LTD  2003    Social History:  reports that he has never smoked.  He has never used smokeless tobacco. He reports that he does not drink alcohol or use drugs.  Allergies:  Allergies  Allergen Reactions  . Pork-Derived Products Swelling    (Not in a hospital admission)    Physical Exam: Blood pressure (!) 173/85, pulse 96, temperature 98.1 F (36.7 C), temperature source Oral, resp. rate 18, height 5\' 11"  (1.803 m), weight 68.5 kg, SpO2 95 %. General: pleasant, WD, WN, elderly black male who is laying in bed in NAD HEENT: head is normocephalic, atraumatic.  Sclera are noninjected.  PERRL.  Patient has a mask on and unable to examine his nose or mouth. Heart: irregularly irregular.  Normal s1,s2. No obvious murmurs, gallops, or rubs noted.  Palpable radial and pedal pulses bilaterally Lungs: CTAB, no wheezes, rhonchi, or rales noted.  Respiratory effort nonlabored Abd: distended and tympanitic, some BS, but mild diffuse tenderness, no peritonitis or guarding, no masses, hernias, or  organomegaly MS: all 4 extremities are symmetrical with no cyanosis, clubbing, or edema. Skin: warm and dry with no masses, lesions, or rashes Psych: A&Ox3 with an appropriate affect.   Results for orders placed or performed during the hospital encounter of 07/30/18 (from the past 48 hour(s))  CBC with Differential/Platelet     Status: Abnormal   Collection Time: 07/30/18 10:09 AM  Result Value Ref Range   WBC 4.4 4.0 - 10.5 K/uL   RBC 5.33 4.22 - 5.81 MIL/uL   Hemoglobin 16.1 13.0 - 17.0 g/dL   HCT 49.0 39.0 - 52.0 %   MCV 91.9 80.0 - 100.0 fL   MCH 30.2 26.0 - 34.0 pg   MCHC 32.9 30.0 - 36.0 g/dL   RDW 15.5 11.5 - 15.5 %   Platelets 140 (L) 150 - 400 K/uL   nRBC 0.0 0.0 - 0.2 %   Neutrophils Relative % 58 %   Neutro Abs 2.6 1.7 - 7.7 K/uL   Lymphocytes Relative 23 %   Lymphs Abs 1.0 0.7 - 4.0 K/uL   Monocytes Relative 17 %   Monocytes Absolute 0.8 0.1 - 1.0 K/uL   Eosinophils Relative 0 %   Eosinophils Absolute 0.0 0.0 - 0.5 K/uL   Basophils Relative 1 %   Basophils Absolute 0.0 0.0 - 0.1 K/uL   WBC Morphology See Note     Comment: Increased Bands. >20% Bands   Immature Granulocytes 1 %   Abs Immature Granulocytes 0.02 0.00 - 0.07 K/uL    Comment: Performed at [facility], [place] [address], [place], [place] [address]  Comprehensive metabolic panel     Status: Abnormal   Collection Time: 07/30/18 10:09 AM  Result Value Ref Range   Sodium 139 135 - 145 mmol/L   Potassium 5.2 (H) 3.5 - 5.1 mmol/L   Chloride 100 98 - 111 mmol/L   CO2 26 22 - 32 mmol/L   Glucose, Bld 138 (H) 70 - 99 mg/dL   BUN 28 (H) 8 - 23 mg/dL   Creatinine, Ser 1.38 (H) 0.61 - 1.24 mg/dL   Calcium 10.0 8.9 - 10.3 mg/dL   Total Protein 7.7 6.5 - 8.1 g/dL   Albumin 4.1 3.5 - 5.0 g/dL   AST 54 (H) 15 - 41 U/L   ALT 16 0 - 44 U/L   Alkaline Phosphatase 85 38 - 126 U/L   Total Bilirubin 2.1 (H) 0.3 - 1.2 mg/dL   GFR calc non Af Amer 47 (L) >60 mL/min   GFR calc Af Amer 54 (L) >60  mL/min   Anion  gap 13 5 - 15    Comment: Performed at [facility] [address] [place], [place] [address]  Lipase, blood     Status: Abnormal   Collection Time: 07/30/18 10:09 AM  Result Value Ref Range   Lipase 66 (H) 11 - 51 U/L    Comment: Performed at [facility] [address] [place], [place] [address]  Urinalysis, Routine w reflex microscopic     Status: Abnormal   Collection Time: 07/30/18 11:48 AM  Result Value Ref Range   Color, Urine YELLOW YELLOW   APPearance CLOUDY (A) CLEAR   Specific Gravity, Urine 1.021 1.005 - 1.030   pH 5.0 5.0 - 8.0   Glucose, UA NEGATIVE NEGATIVE mg/dL   Hgb urine dipstick SMALL (A) NEGATIVE   Bilirubin Urine NEGATIVE NEGATIVE   Ketones, ur NEGATIVE NEGATIVE mg/dL   Protein, ur 100 (A) NEGATIVE mg/dL   Nitrite NEGATIVE NEGATIVE   Leukocytes,Ua NEGATIVE NEGATIVE   RBC / HPF 0-5 0 - 5 RBC/hpf   WBC, UA 11-20 0 - 5 WBC/hpf   Bacteria, UA RARE (A) NONE SEEN   Squamous Epithelial / LPF 0-5 0 - 5   Mucus PRESENT    Granular Casts, UA PRESENT     Comment: Performed at [facility], [place] [address], [place],  [address]   Ct Abdomen Pelvis W Contrast  Result Date: 07/30/2018 CLINICAL DATA:  Acute abdominal pain. EXAM: CT ABDOMEN AND PELVIS WITH CONTRAST TECHNIQUE: Multidetector CT imaging of the abdomen and pelvis was performed using the standard protocol following bolus administration of intravenous contrast. CONTRAST:  1110mL OMNIPAQUE IOHEXOL 300 MG/ML  SOLN COMPARISON:  10/19/2004 FINDINGS: Lower chest: Small right pleural effusion. Hepatobiliary: Small cyst in left hepatic lobe. No other hepatic mass. No gallstones, gallbladder wall thickening, or biliary dilatation. Pancreas: Unremarkable. No pancreatic ductal dilatation or surrounding inflammatory changes. Spleen: Normal in size without focal abnormality. Adrenals/Urinary Tract: Adrenal glands are unremarkable. Kidneys are normal, without renal calculi, focal lesion, or hydronephrosis.  Bladder is unremarkable. Stomach/Bowel: Severe distension of the stomach with a large air-fluid level. Severe dilatation of the small bowel measuring up to 4 cm with numerous air-fluid levels. Distal small bowel is decompressed. Relative transition point in the right mid anterior abdomen which may be due to an internal hernia. No pneumatosis, pneumoperitoneum or portal venous gas. Normal appendix. Vascular/Lymphatic: Normal caliber abdominal aorta with mild atherosclerosis. No lymphadenopathy. Reproductive: Enlarged prostate gland measuring 6.5 x 6.7 cm. Other: No abdominal wall hernia or abnormality. No abdominopelvic ascites. Musculoskeletal: One no acute osseous abnormality. No aggressive osseous lesion. Grade 1 anterolisthesis of L4 on L5 secondary to facet disease. IMPRESSION: 1. High-grade small bowel obstruction with a relative transition point in the right mid anterior abdomen. 2. Small right pleural effusion. 3. Prostatic enlargement. Electronically Signed   By: Kathreen Devoid   On: 07/30/2018 13:40      Assessment/Plan A fib - on eliquis, last dose on Tuesday, please hold.  Ok for heparin gtt if needed HTN - defer to medicine  Small Bowel Obstruction The patient has been found to have a high grade SBO on CT scan.  He has never has intra-peritoneal surgery, but did have a laparoscopic inguinal hernia repair which could have punctured the peritoneum causing some scar tissue.  No obvious hernias, masses, or other clear source of obstruction identified on his scan.  He will be admitted to the medical service.  We will attempt conservative medical management with an NGT and the SBO protocol.  Hopefully he will improve with this treatment, but if he does not, he may require surgical intervention.  This was discussed with the patient.  He seems to understand and all questions were answered.  I will try to contact his wife to update her on this status as well.   FEN - NPO/NGT/IVFs VTE - defer to  medical service, eliquis on hold ID - none needed from SBO standpoint  Henreitta Cea, [facility] Surgery 07/30/2018, 3:47 PM Pager: [phone]

## 2018-07-30 NOTE — ED Provider Notes (Signed)
[facility] EMERGENCY DEPARTMENT Provider Note   CSN: [id number] Arrival date & time: 07/30/18  0940    History   Chief Complaint Chief Complaint  Patient presents with  . Abdominal Pain    HPI Randy Fuller is a 85 y.o. male.     HPI 85 year old male with a history of prior umbilical hernia repair presents the emergency department with abdominal distention and abdominal pain with associated nausea vomiting over the past 4 days.  He feels like his had one solid bowel movement.  Decreased oral intake.  Abdominal pain is constant and worsening.  No history of small bowel obstruction before in the past.  No new medications.  No urinary complaints.  No fevers or chills.  No recent sick contacts.  Denies back or flank pain   Past Medical History:  Diagnosis Date  . Anemia   . Arthritis   . Atrial fibrillation ([place])    on Coumadin but on hold for surgery bridging with lovenox  . Atrial fibrillation ([place])    also takes Digoxin daily  . Benign prostate hyperplasia   . Bilateral pneumonia   . Bruises easily    d/t being on Coumadin  . Cataract   . Colon polyps    2003  . Diverticulosis   . Dry skin    uses Kenalog cream as needed  . ED (erectile dysfunction)   . Elevated LFTs   . Enlarged prostate    takes Flomax daily  . Esophageal stricture   . Esophagitis   . Fatty liver   . GERD (gastroesophageal reflux disease)    takes Prilosec daily  . Glaucoma   . Gout    just stopped medication this week per Medical Md(was on Allopurinol)  . Hearing loss   . Hernia   . Hyperlipidemia    takes Atorvastatin every other day  . Impaired glucose tolerance   . Internal hemorrhoids   . Irregular heart beat   . Nocturia   . Vitamin D deficiency     Patient Active Problem List   Diagnosis Date Noted  . Abnormal echocardiogram 05/20/2018  . Vitamin D deficiency   . Nocturia   . Internal hemorrhoids   . Impaired glucose tolerance   . Hyperlipidemia    . Hearing loss   . Gout   . Glaucoma   . GERD (gastroesophageal reflux disease)   . Fatty liver   . Esophagitis   . Enlarged prostate   . Elevated LFTs   . ED (erectile dysfunction)   . Dry skin   . Diverticulosis   . Colon polyps   . Cataract   . Bruises easily   . Bilateral pneumonia   . Benign prostate hyperplasia   . Arthritis   . Anemia   . Atrial fibrillation, chronic 10/09/2016  . Esophageal stricture   . Chronic anticoagulation 08/13/2012  . Benign neoplasm of colon 06/21/2003    Past Surgical History:  Procedure Laterality Date  . CATARACT EXTRACTION     Both eyes  . DOPPLER ECHOCARDIOGRAPHY  2003  . EYE SURGERY Left 2010   glaucoma  . EYE SURGERY Bilateral 2013   cataract extractions and lens implants  . HERNIA REPAIR Left 08/31/12   Left Inguinal Hernia Repair  . INGUINAL HERNIA REPAIR Left 08/31/2012   Procedure: LAPAROSCOPIC INGUINAL HERNIA;  Surgeon: Ralene Ok, MD;  Location: [facility];  Service: General;  Laterality: Left;  . INSERTION OF MESH Left 08/31/2012   Procedure:  INSERTION OF MESH;  Surgeon: Ralene Ok, MD;  Location: [facility];  Service: General;  Laterality: Left;  . NM MYOVIEW LTD  2003        Home Medications    Prior to Admission medications   Medication Sig Start Date End Date Taking? Authorizing Provider  allopurinol (ZYLOPRIM) 300 MG tablet Take 450 mg by mouth daily. 11/08/13   [provider]  amLODipine (NORVASC) 2.5 MG tablet Take 1 tablet (2.5 mg total) by mouth daily. 07/03/17 11/21/17  Barrett, Evelene Croon, PA-C  Cholecalciferol 1000 UNITS capsule Take 1,000 Units by mouth daily.    [provider]  dorzolamide-timolol (COSOPT) 22.3-6.8 MG/ML ophthalmic solution Place 1 drop into both eyes 2 (two) times daily.  02/13/12   [provider]  ELIQUIS 5 MG TABS tablet TAKE 1 TABLET TWICE A DAY 05/14/18   Lorretta Harp, MD  finasteride (PROSCAR) 5 MG tablet Take 5 mg by mouth daily.    [provider]  latanoprost (XALATAN) 0.005 % ophthalmic solution Place 1 drop into both eyes daily.    [provider]  losartan (COZAAR) 50 MG tablet Take 1 tablet (50 mg total) by mouth daily. 06/17/18   Lorretta Harp, MD  Multiple Vitamins-Minerals (CENTRUM SILVER PO) Take 1 tablet by mouth daily.     [provider]  rosuvastatin (CRESTOR) 40 MG tablet Take 1 tablet (40 mg total) by mouth daily. 03/15/15   Lorretta Harp, MD  sildenafil (VIAGRA) 100 MG tablet Take 100 mg by mouth as needed.     [provider]  Tamsulosin HCl (FLOMAX) 0.4 MG CAPS Take 0.4 mg by mouth.    [provider]  triamcinolone ointment (KENALOG) 0.1 % Apply 1 application topically 2 (two) times daily as needed (DRY SKIN).     [provider]    Family History Family History  Problem Relation Age of Onset  . Heart disease Sister   . Heart disease Brother   . Cirrhosis Brother   . Cancer Sister   . Cancer Unknown        Sister/ not sure what kind    Social History Social History   Tobacco Use  . Smoking status: Never Smoker  . Smokeless tobacco: Never Used  Substance Use Topics  . Alcohol use: No  . Drug use: No     Allergies   Pork-derived products   Review of Systems Review of Systems  All other systems reviewed and are negative.    Physical Exam Updated Vital Signs BP (!) 163/97   Pulse 81   Temp 98.1 F (36.7 C) (Oral)   Resp 18   Ht 5\' 11"  (1.803 m)   Wt 68.5 kg   SpO2 94%   BMI 21.06 kg/m   Physical Exam Vitals signs and nursing note reviewed.  Constitutional:      Appearance: He is well-developed.  HENT:     Head: Normocephalic and atraumatic.  Neck:     Musculoskeletal: Normal range of motion.  Cardiovascular:     Rate and Rhythm: Normal rate and regular rhythm.     Heart sounds: Normal heart sounds.  Pulmonary:     Effort: Pulmonary effort is normal. No respiratory distress.     Breath sounds: Normal breath sounds.   Abdominal:     General: A surgical scar is present. There is distension. There are no signs of injury.     Palpations: Abdomen is soft. There is no pulsatile  mass.     Tenderness: There is generalized abdominal tenderness. There is no guarding or rebound.  Musculoskeletal: Normal range of motion.  Skin:    General: Skin is warm and dry.  Neurological:     Mental Status: He is alert and oriented to person, place, and time.  Psychiatric:        Judgment: Judgment normal.      ED Treatments / Results  Labs (all labs ordered are listed, but only abnormal results are displayed) Labs Reviewed  CBC WITH DIFFERENTIAL/PLATELET - Abnormal; Notable for the following components:      Result Value   Platelets 140 (*)    All other components within normal limits  COMPREHENSIVE METABOLIC PANEL  LIPASE, BLOOD  URINALYSIS, ROUTINE W REFLEX MICROSCOPIC    EKG None  Radiology Ct Abdomen Pelvis W Contrast  Result Date: 07/30/2018 CLINICAL DATA:  Acute abdominal pain. EXAM: CT ABDOMEN AND PELVIS WITH CONTRAST TECHNIQUE: Multidetector CT imaging of the abdomen and pelvis was performed using the standard protocol following bolus administration of intravenous contrast. CONTRAST:  152mL OMNIPAQUE IOHEXOL 300 MG/ML  SOLN COMPARISON:  10/19/2004 FINDINGS: Lower chest: Small right pleural effusion. Hepatobiliary: Small cyst in left hepatic lobe. No other hepatic mass. No gallstones, gallbladder wall thickening, or biliary dilatation. Pancreas: Unremarkable. No pancreatic ductal dilatation or surrounding inflammatory changes. Spleen: Normal in size without focal abnormality. Adrenals/Urinary Tract: Adrenal glands are unremarkable. Kidneys are normal, without renal calculi, focal lesion, or hydronephrosis. Bladder is unremarkable. Stomach/Bowel: Severe distension of the stomach with a large air-fluid level. Severe dilatation of the small bowel measuring up to 4 cm with numerous air-fluid levels. Distal small  bowel is decompressed. Relative transition point in the right mid anterior abdomen which may be due to an internal hernia. No pneumatosis, pneumoperitoneum or portal venous gas. Normal appendix. Vascular/Lymphatic: Normal caliber abdominal aorta with mild atherosclerosis. No lymphadenopathy. Reproductive: Enlarged prostate gland measuring 6.5 x 6.7 cm. Other: No abdominal wall hernia or abnormality. No abdominopelvic ascites. Musculoskeletal: One no acute osseous abnormality. No aggressive osseous lesion. Grade 1 anterolisthesis of L4 on L5 secondary to facet disease. IMPRESSION: 1. High-grade small bowel obstruction with a relative transition point in the right mid anterior abdomen. 2. Small right pleural effusion. 3. Prostatic enlargement. Electronically Signed   By: Kathreen Devoid   On: 07/30/2018 13:40       Procedures Procedures (including critical care time)  Medications Ordered in ED Medications  morphine 4 MG/ML injection 6 mg (6 mg Intravenous Given 07/30/18 1026)  ondansetron (ZOFRAN) injection 4 mg (4 mg Intravenous Given 07/30/18 1026)  sodium chloride 0.9 % bolus 1,000 mL (1,000 mLs Intravenous New Bag/Given 07/30/18 1025)     Initial Impression / Assessment and Plan / ED Course  I have reviewed the triage vital signs and the nursing notes.  Pertinent labs & imaging results that were available during my care of the patient were reviewed by me and considered in my medical decision making (see chart for details).  Clinical Course as of Jul 30 2201  Thu Jul 30, 2018  1210 Urinalysis, Routine w reflex microscopic(!) [AH]  1210 WBC, UA: 11-20 [AH]  1210 Lipase(!): 66 [AH]  1210 Potassium(!): 5.2 [AH]  1210 Total Bilirubin(!): 2.1 [AH]  1210 AST(!): 54 [AH]    Clinical Course User Index [AH] Margarita Mail, PA-C       Concern for small bowel obstruction or other intra-abdominal pathology.  Patient to complete his care here in the emergency  department with Margarita Mail, PA-C.   Labs and CT scan pending at this time  CT imaging demonstrates small bowel obstruction.  NG tube placed.  General surgery consultation.  Admit to the hospital.  Bowel rest.  Final Clinical Impressions(s) / ED Diagnoses   Final diagnoses:  Small bowel obstruction ([place])  SBO (small bowel obstruction) [facility])    ED Discharge Orders    None       Jola Schmidt, MD 07/31/18 2205

## 2018-07-30 NOTE — H&P (Addendum)
[facility] Admission History and Physical Service Pager: [phone]  Patient name: Randy Fuller Medical record number: [id number] Date of birth: 11-Jun-1933 Age: 85 y.o. Gender: male  Primary Care Provider: Derinda Late, MD Consultants: Surgery Code Status: Full, confirmed with patient on admission  Preferred Emergency Contact: Randy Fuller, patient's wife [phone]  Chief Complaint: Abdominal Pain  Assessment and Plan: Randy Fuller is a 85 y.o. male presenting with 4 days abdominal pain. PMH is significant for Atrial Fibrillation on Eliquis, Diverticulosis, HTN, HLD,   Small Bowel Obstruction 4 days abdominal pain with n/v x 2 days.  Last BM yesterday, was liquid.  NBNB vomit and no hematochezia.  Has been able to only tolerate liquids the last 2 days.  Abdomen distended on exam.  CT abd/pelvis shows high-grade SBP with relative transition point in right mid anterior abdomen.  Risk factors for SBO include hx diverticulosis and inguinal hernia repair, no other intraabdominal surgeries.  Last colonoscopy 2014 with diverticulosis only noted.  No history of prior SBO, malignancy, or IBD.  Hgb stable at 16.1.  Surgery consulted in ED and recommended SBO protocol.  S/P 1L NS bolus in ED with morphine 4mg , zofran, and reglan.  Differential also includes bowel perforation, although not seen on imaging, malignancy causing SBO, adhesions causing SBO, mesenteric ischemia, although doubt this given radiographic evidence of SBO, or pancreatitis, although lipase is mildly elevated to 66, it is likely 2/2 SBO, not cause of abdominal pain. - admit to telemetry, Dr. Cindra Fuller service - vitals per floor protocol - cardiac monitoring - OOB with assistance - General Surgery consulted, appreciate recs - SBO protocol: NG tube - s/p 1L NS - mIVF NS @ 100 cc/hr - zofran prn nausea - IV tylenol sch q6h - IV morphine 1mg  q2h prn breakthrough pain, can increase to  2mg  q2h prn if required  - IV zofran 4mg  q6h prn nausea - NPO - Argatroban per pharmacy for A Fib given NPO, will need to stop 6 hrs prior to procedure if occurs (patient refuses pork products) - PT/OT   Atrial Fibrillation on Eliquis and Bradycardia Recently seen by Cardiologist, Dr. Gwenlyn Fuller, for fatigue and bradycardia to 30s-40s.  He was referred to EP, but has not yet been seen by them.  Last Echo 09/2017 with EF 55-60%, RA and LA severly dilated, mod increased Pulm Artery Pressure, cardiologist notes suggestive of possible amyloid, but patient was asymptomatic and decided to not work this up.  HR on admission 96.  Has not taken Eliquis in last 2 days due to inability to tolerate PO. - EKG - hold Eliquis while NPO - heparin per pharmacy for A Fib while NPO and pending surgery - consider EP eval while inpatient if bradycardic  - cardiac telemetry  HTN BP 173/85 on admission.  On norvasc 2.5mg  QD and losartan 50mg  QD at home.  Will need to hold home oral BP meds given NPO with SBO.   - hold home meds while NPO - can use hydral if HTN urgency   HLD On Crestor 40mg  QD at home. - hold crestor while NPO  Gout On Allopurinol 450mg  QD at home. - hold allopurinol while NPO  Glaucoma Patient uses Cosopt and Xalatan at home.   - cont home gtts  BPH Patient reports some chronic difficulty with urination.  On finasteride and tamsulosin at home. - hold home meds while NPO - can in and out cath if needed for urinary retention  CKD 3A Cr  1.38 on admission. BL 1.2-13 - avoid nephrotoxic agents  Mild Hyperkalemia K+ 5.2. Likely in the setting of vomiting and dehydration - monitor K   FEN/GI: NPO with NG tube Prophylaxis: Argatroban   Disposition: admit to telemetry  History of Present Illness:  Randy Fuller is a 85 y.o. male presenting with 4 days of diffuse frontal abdominal pain.  Patient also endorses 2 days of nausea and vomiting, reporting about 3 episodes of emesis.  He  denies vomiting blood.  He states that for the last 2 days he has not been able to tolerate anything but liquids.  He states he was able to drink a small amount of Ensure this morning.  He reports that his last bowel movement was yesterday and that it was very "liquidy."  He states that his last normal bowel movement was 4 days ago.  He states that he has not been able to take his oral medications in the last 2 days.  Prior to 4 days ago, he was in his normal state of health.  Since that time, he has been having increasing weakness and fatigue.  He denies any hematemesis, hematochezia, headache, dizziness, chest pain, shortness of breath, fevers, sick contacts.  Patient does report a history of prostate problems and states that he chronically has difficulty urinating, but has had no changes in this.  In the ED, patient had CT abdomen and pelvis which showed a high-grade SBO.  General surgery was consulted and recommended admission with SBO protocol including NG tube.  Patient received 1 L bolus of normal saline, Zofran, Reglan, morphine with some improvement in his pain.  Review Of Systems: Per HPI with the following additions:   Review of Systems  Constitutional: Positive for malaise/fatigue. Negative for chills, fever and weight loss.  HENT: Negative for congestion and sore throat.   Respiratory: Negative for cough and shortness of breath.   Cardiovascular: Negative for chest pain, palpitations and leg swelling.  Gastrointestinal: Positive for abdominal pain, diarrhea, nausea and vomiting. Negative for blood in stool and melena.  Genitourinary:       Hesitation with urination  Neurological: Positive for weakness. Negative for dizziness, focal weakness, loss of consciousness and headaches.  Psychiatric/Behavioral: Negative for substance abuse.    Patient Active Problem List   Diagnosis Date Noted  . Abnormal echocardiogram 05/20/2018  . Vitamin D deficiency   . Nocturia   . Internal  hemorrhoids   . Impaired glucose tolerance   . Hyperlipidemia   . Hearing loss   . Gout   . Glaucoma   . GERD (gastroesophageal reflux disease)   . Fatty liver   . Esophagitis   . Enlarged prostate   . Elevated LFTs   . ED (erectile dysfunction)   . Dry skin   . Diverticulosis   . Colon polyps   . Cataract   . Bruises easily   . Bilateral pneumonia   . Benign prostate hyperplasia   . Arthritis   . Anemia   . Atrial fibrillation, chronic 10/09/2016  . Esophageal stricture   . Chronic anticoagulation 08/13/2012  . Benign neoplasm of colon 06/21/2003    Past Medical History: Past Medical History:  Diagnosis Date  . Anemia   . Arthritis   . Atrial fibrillation ([employer])    on Coumadin but on hold for surgery bridging with lovenox  . Atrial fibrillation ([place])    also takes Digoxin daily  . Benign prostate hyperplasia   . Bilateral pneumonia   .  Bruises easily    d/t being on Coumadin  . Cataract   . Colon polyps    2003  . Diverticulosis   . Dry skin    uses Kenalog cream as needed  . ED (erectile dysfunction)   . Elevated LFTs   . Enlarged prostate    takes Flomax daily  . Esophageal stricture   . Esophagitis   . Fatty liver   . GERD (gastroesophageal reflux disease)    takes Prilosec daily  . Glaucoma   . Gout    just stopped medication this week per Medical Md(was on Allopurinol)  . Hearing loss   . Hernia   . Hyperlipidemia    takes Atorvastatin every other day  . Impaired glucose tolerance   . Internal hemorrhoids   . Irregular heart beat   . Nocturia   . Vitamin D deficiency     Past Surgical History: Past Surgical History:  Procedure Laterality Date  . CATARACT EXTRACTION     Both eyes  . DOPPLER ECHOCARDIOGRAPHY  2003  . EYE SURGERY Left 2010   glaucoma  . EYE SURGERY Bilateral 2013   cataract extractions and lens implants  . HERNIA REPAIR Left 08/31/12   Left Inguinal Hernia Repair  . INGUINAL HERNIA REPAIR Left 08/31/2012   Procedure:  LAPAROSCOPIC INGUINAL HERNIA;  Surgeon: Ralene Ok, MD;  Location: [facility];  Service: General;  Laterality: Left;  . INSERTION OF MESH Left 08/31/2012   Procedure: INSERTION OF MESH;  Surgeon: Ralene Ok, MD;  Location: [facility];  Service: General;  Laterality: Left;  . NM MYOVIEW LTD  2003    Social History: Social History   Tobacco Use  . Smoking status: Never Smoker  . Smokeless tobacco: Never Used  Substance Use Topics  . Alcohol use: No  . Drug use: No   Additional social history: None  Please also refer to relevant sections of EMR.  Family History: Family History  Problem Relation Age of Onset  . Heart disease Sister   . Heart disease Brother   . Cirrhosis Brother   . Cancer Sister   . Cancer Unknown        Sister/ not sure what kind    Allergies and Medications: Allergies  Allergen Reactions  . Pork-Derived Products Swelling   No current facility-administered medications on file prior to encounter.    Current Outpatient Medications on File Prior to Encounter  Medication Sig Dispense Refill  . allopurinol (ZYLOPRIM) 300 MG tablet Take 450 mg by mouth daily.    [name] amLODipine (NORVASC) 2.5 MG tablet Take 1 tablet (2.5 mg total) by mouth daily. 90 tablet 3  . Cholecalciferol 1000 UNITS capsule Take 1,000 Units by mouth daily.    . dorzolamide-timolol (COSOPT) 22.3-6.8 MG/ML ophthalmic solution Place 1 drop into both eyes 2 (two) times daily.     [name] ELIQUIS 5 MG TABS tablet TAKE 1 TABLET TWICE A DAY 180 tablet 0  . finasteride (PROSCAR) 5 MG tablet Take 5 mg by mouth daily.    [name] latanoprost (XALATAN) 0.005 % ophthalmic solution Place 1 drop into both eyes daily.    [name] losartan (COZAAR) 50 MG tablet Take 1 tablet (50 mg total) by mouth daily. 90 tablet 3  . Multiple Vitamins-Minerals (CENTRUM SILVER PO) Take 1 tablet by mouth daily.     . rosuvastatin (CRESTOR) 40 MG tablet Take 1 tablet (40 mg total) by mouth daily. 90 tablet 3  . sildenafil (VIAGRA) 100 MG tablet  Take 100 mg by mouth as needed.     . Tamsulosin HCl (FLOMAX) 0.4 MG CAPS Take 0.4 mg by mouth.    . triamcinolone ointment (KENALOG) 0.1 % Apply 1 application topically 2 (two) times daily as needed (DRY SKIN).       Objective: BP (!) 168/94   Pulse 87   Temp 98.1 F (36.7 C) (Oral)   Resp 16   Ht 5\' 11"  (1.803 m)   Wt 68.5 kg   SpO2 94%   BMI 21.06 kg/m   Physical Exam: General: 85 y.o. male in NAD, lying in head with head of bed eleavted HEENT: NCAT, PERRL, EOMI, MMM Neck: supple, full ROM Cardio: irregularly irregular, 2+ pulses radial Lungs: CTAB, no wheezing, no rhonchi, no crackles, no increased WOB on RA Abdomen: Taut, distended, diffusely tender, hyperactive bowel sounds in upper abdomen with decreased bowel sounds in lower abdomen, increased tympany to percussion Skin: warm and dry Extremities: No edema Neuro: A&Ox3 Psych: mood and and affect appropriate for circumstance, linear thought process, clear speech   Labs and Imaging: CBC BMET  Recent Labs  Lab 07/30/18 1009  WBC 4.4  HGB 16.1  HCT 49.0  PLT 140*   Recent Labs  Lab 07/30/18 1009  NA 139  K 5.2*  CL 100  CO2 26  BUN 28*  CREATININE 1.38*  GLUCOSE 138*  CALCIUM 10.0     Ct Abdomen Pelvis W Contrast  Result Date: 07/30/2018 CLINICAL DATA:  Acute abdominal pain. EXAM: CT ABDOMEN AND PELVIS WITH CONTRAST TECHNIQUE: Multidetector CT imaging of the abdomen and pelvis was performed using the standard protocol following bolus administration of intravenous contrast. CONTRAST:  165mL OMNIPAQUE IOHEXOL 300 MG/ML  SOLN COMPARISON:  10/19/2004 FINDINGS: Lower chest: Small right pleural effusion. Hepatobiliary: Small cyst in left hepatic lobe. No other hepatic mass. No gallstones, gallbladder wall thickening, or biliary dilatation. Pancreas: Unremarkable. No pancreatic ductal dilatation or surrounding inflammatory changes. Spleen: Normal in size without focal abnormality. Adrenals/Urinary Tract: Adrenal  glands are unremarkable. Kidneys are normal, without renal calculi, focal lesion, or hydronephrosis. Bladder is unremarkable. Stomach/Bowel: Severe distension of the stomach with a large air-fluid level. Severe dilatation of the small bowel measuring up to 4 cm with numerous air-fluid levels. Distal small bowel is decompressed. Relative transition point in the right mid anterior abdomen which may be due to an internal hernia. No pneumatosis, pneumoperitoneum or portal venous gas. Normal appendix. Vascular/Lymphatic: Normal caliber abdominal aorta with mild atherosclerosis. No lymphadenopathy. Reproductive: Enlarged prostate gland measuring 6.5 x 6.7 cm. Other: No abdominal wall hernia or abnormality. No abdominopelvic ascites. Musculoskeletal: One no acute osseous abnormality. No aggressive osseous lesion. Grade 1 anterolisthesis of L4 on L5 secondary to facet disease. IMPRESSION: 1. High-grade small bowel obstruction with a relative transition point in the right mid anterior abdomen. 2. Small right pleural effusion. 3. Prostatic enlargement. Electronically Signed   By: Kathreen Devoid   On: 07/30/2018 13:40   [place], Bernita Raisin, DO 07/30/2018, 2:28 PM PGY-1, [place] Intern pager: [phone], text pages welcome  [place]   I have seen and examined this patient.    I have discussed the findings and exam with the intern and agree with the above note, which I have edited appropriately in [place]. I helped develop the management plan that is described in the resident's note, and I agree with the content.   Marny Lowenstein, MD, [place] - PGY2 07/30/2018 4:24 PM

## 2018-07-30 NOTE — Progress Notes (Signed)
ANTICOAGULATION CONSULT NOTE - Initial Consult  Pharmacy Consult for argatroban Indication: atrial fibrillation   Patient Measurements: Height: 5\' 11"  (180.3 cm) Weight: 151 lb (68.5 kg) IBW/kg (Calculated) : 75.3 Heparin Dosing Weight: 68.5 kg  Vital Signs: Temp: 98.1 F (36.7 C) (04/30 0955) Temp Source: Oral (04/30 0955) BP: 173/85 (04/30 1433) Pulse Rate: 96 (04/30 1433)  Labs: Recent Labs    07/30/18 1009  HGB 16.1  HCT 49.0  PLT 140*  CREATININE 1.38*     Assessment: 85 yo male on Eliquis prior to admission for AFib. He has not had a dose in a couple days. Originally planned to heparin infusion; however, the patient has an allergy listed to heparin. I discussed this with him, he would prefer not to have a medication that contains pork products. SCr 1.38, AST + Tbili midly elevated, ALT ok.     Goal of Therapy:  Heparin level 0.3-0.7 units/ml Monitor platelets by anticoagulation protocol: Yes    Plan:  Argatroban 0.5 mcg/kg/min Daily aPTT, CBC Check aPTT q2hours until two therapeutic values Monitor plans for surgery Monitor trends in liver function, consider fondaparinux   Harvel Quale 07/30/2018,3:47 PM

## 2018-07-30 NOTE — ED Triage Notes (Signed)
Pt c/o abd pain since Sunday with nausea, vomitting & diarrhea. His last "solid food" meal was breakfast Sunday morning (per pt).

## 2018-07-30 NOTE — ED Notes (Signed)
Called to check on the status of xray. They will be coming. Charge RN notified and in agreement for xray to be done before going upstairs.

## 2018-07-30 NOTE — ED Notes (Signed)
Called 6N to explain delay with NG tube placement and to let the know the pt is on the way.

## 2018-07-30 NOTE — ED Notes (Signed)
Called CT to let them know pt was ready for scan.

## 2018-07-30 NOTE — ED Notes (Signed)
Patient transported to CT 

## 2018-07-30 NOTE — ED Notes (Signed)
Xray confirmed NG tube was in stomach but needed to be advanced 5-6 cm. Advanced tube 6 cm and collected 1600 ml of green gastric contents.

## 2018-07-30 NOTE — ED Notes (Signed)
This RN and Nicki S inserted NG tube into the pt left nare.

## 2018-07-30 NOTE — ED Notes (Signed)
Gen Sx and admitting at bedside

## 2018-07-30 NOTE — Progress Notes (Signed)
Received patient from ED. Patient alert and oriented x4. NGT tube clamped, awaiting xray to confirm placement after 6cm advancement in the ED. Patient oriented to bed controls and call bell. Will continue to monitor.

## 2018-07-30 NOTE — ED Notes (Signed)
Patient returned from CT

## 2018-07-30 NOTE — Progress Notes (Signed)
NGT clamped at 2135 and gastrografin administered.  Will put back NGT to LIS at 2235 and will inform radiology of the gastrografin administration.  Patient tolerated well the procedure.

## 2018-07-30 NOTE — ED Notes (Signed)
ED TO INPATIENT HANDOFF REPORT  ED Nurse Name and Phone #: Benjamine Mola [phone]  S Name/Age/Gender [place] 85 y.o. male Room/Bed: 039C/039C  Code Status   Code Status: Prior  Home/SNF/Other Home Patient oriented to: self, place, time and situation Is this baseline? Yes   Triage Complete: Triage complete  Chief Complaint abd pain  Triage Note Pt c/o abd pain since Sunday with nausea, vomitting & diarrhea. His last "solid food" meal was breakfast Sunday morning (per pt).     Allergies Allergies  Allergen Reactions  . Pork-Derived Products Swelling    Level of Care/Admitting Diagnosis ED Disposition    ED Disposition Condition Comment   Admit  Hospital Area: [facility] [[id number]]  Level of Care: Telemetry Medical [884]  Covid Evaluation: N/A  Diagnosis: SBO (small bowel obstruction) ([place]) [166063]  Admitting Physician: Alveda Reasons [[id number]]  Attending Physician: Mingo Amber, JEFFREY H [[id number]]  Estimated length of stay: past midnight tomorrow  Certification:: I certify this patient will need inpatient services for at least 2 midnights  PT Class (Do Not Modify): Inpatient [101]  PT Acc Code (Do Not Modify): Private [1]       B Medical/Surgery History Past Medical History:  Diagnosis Date  . Anemia   . Arthritis   . Atrial fibrillation ([place])    on Coumadin but on hold for surgery bridging with lovenox  . Atrial fibrillation ([place])    also takes Digoxin daily  . Benign prostate hyperplasia   . Bilateral pneumonia   . Bruises easily    d/t being on Coumadin  . Cataract   . Colon polyps    2003  . Diverticulosis   . Dry skin    uses Kenalog cream as needed  . ED (erectile dysfunction)   . Elevated LFTs   . Enlarged prostate    takes Flomax daily  . Esophageal stricture   . Esophagitis   . Fatty liver   . GERD (gastroesophageal reflux disease)    takes Prilosec daily  . Glaucoma   . Gout    just stopped medication this week  per Medical Md(was on Allopurinol)  . Hearing loss   . Hernia   . Hyperlipidemia    takes Atorvastatin every other day  . Impaired glucose tolerance   . Internal hemorrhoids   . Irregular heart beat   . Nocturia   . Vitamin D deficiency    Past Surgical History:  Procedure Laterality Date  . CATARACT EXTRACTION     Both eyes  . DOPPLER ECHOCARDIOGRAPHY  2003  . EYE SURGERY Left 2010   glaucoma  . EYE SURGERY Bilateral 2013   cataract extractions and lens implants  . HERNIA REPAIR Left 08/31/12   Left Inguinal Hernia Repair  . INGUINAL HERNIA REPAIR Left 08/31/2012   Procedure: LAPAROSCOPIC INGUINAL HERNIA;  Surgeon: Ralene Ok, MD;  Location: [facility];  Service: General;  Laterality: Left;  . INSERTION OF MESH Left 08/31/2012   Procedure: INSERTION OF MESH;  Surgeon: Ralene Ok, MD;  Location: [facility];  Service: General;  Laterality: Left;  . NM MYOVIEW LTD  2003     A IV Location/Drains/Wounds Patient Lines/Drains/Airways Status   Active Line/Drains/Airways    Name:   Placement date:   Placement time:   Site:   Days:   Peripheral IV 07/30/18 Left Antecubital   07/30/18    1025    Antecubital   less than 1  Intake/Output Last 24 hours  Intake/Output Summary (Last 24 hours) at 07/30/2018 1536 Last data filed at 07/30/2018 1201 Gross per 24 hour  Intake 1000 ml  Output -  Net 1000 ml    Labs/Imaging Results for orders placed or performed during the hospital encounter of 07/30/18 (from the past 48 hour(s))  CBC with Differential/Platelet     Status: Abnormal   Collection Time: 07/30/18 10:09 AM  Result Value Ref Range   WBC 4.4 4.0 - 10.5 K/uL   RBC 5.33 4.22 - 5.81 MIL/uL   Hemoglobin 16.1 13.0 - 17.0 g/dL   HCT 49.0 39.0 - 52.0 %   MCV 91.9 80.0 - 100.0 fL   MCH 30.2 26.0 - 34.0 pg   MCHC 32.9 30.0 - 36.0 g/dL   RDW 15.5 11.5 - 15.5 %   Platelets 140 (L) 150 - 400 K/uL   nRBC 0.0 0.0 - 0.2 %   Neutrophils Relative % 58 %   Neutro Abs 2.6 1.7 - 7.7  K/uL   Lymphocytes Relative 23 %   Lymphs Abs 1.0 0.7 - 4.0 K/uL   Monocytes Relative 17 %   Monocytes Absolute 0.8 0.1 - 1.0 K/uL   Eosinophils Relative 0 %   Eosinophils Absolute 0.0 0.0 - 0.5 K/uL   Basophils Relative 1 %   Basophils Absolute 0.0 0.0 - 0.1 K/uL   WBC Morphology See Note     Comment: Increased Bands. >20% Bands   Immature Granulocytes 1 %   Abs Immature Granulocytes 0.02 0.00 - 0.07 K/uL    Comment: Performed at [facility], [place] [address], [place], [place] [address]  Comprehensive metabolic panel     Status: Abnormal   Collection Time: 07/30/18 10:09 AM  Result Value Ref Range   Sodium 139 135 - 145 mmol/L   Potassium 5.2 (H) 3.5 - 5.1 mmol/L   Chloride 100 98 - 111 mmol/L   CO2 26 22 - 32 mmol/L   Glucose, Bld 138 (H) 70 - 99 mg/dL   BUN 28 (H) 8 - 23 mg/dL   Creatinine, Ser 1.38 (H) 0.61 - 1.24 mg/dL   Calcium 10.0 8.9 - 10.3 mg/dL   Total Protein 7.7 6.5 - 8.1 g/dL   Albumin 4.1 3.5 - 5.0 g/dL   AST 54 (H) 15 - 41 U/L   ALT 16 0 - 44 U/L   Alkaline Phosphatase 85 38 - 126 U/L   Total Bilirubin 2.1 (H) 0.3 - 1.2 mg/dL   GFR calc non Af Amer 47 (L) >60 mL/min   GFR calc Af Amer 54 (L) >60 mL/min   Anion gap 13 5 - 15    Comment: Performed at [facility], [place] [address], [place], [place] [address]  Lipase, blood     Status: Abnormal   Collection Time: 07/30/18 10:09 AM  Result Value Ref Range   Lipase 66 (H) 11 - 51 U/L    Comment: Performed at [facility] [address], [place], [place] [address]  Urinalysis, Routine w reflex microscopic     Status: Abnormal   Collection Time: 07/30/18 11:48 AM  Result Value Ref Range   Color, Urine YELLOW YELLOW   APPearance CLOUDY (A) CLEAR   Specific Gravity, Urine 1.021 1.005 - 1.030   pH 5.0 5.0 - 8.0   Glucose, UA NEGATIVE NEGATIVE mg/dL   Hgb urine dipstick SMALL (A) NEGATIVE   Bilirubin Urine NEGATIVE NEGATIVE   Ketones, ur NEGATIVE NEGATIVE  mg/dL   Protein, ur 100 (A) NEGATIVE  mg/dL   Nitrite NEGATIVE NEGATIVE   Leukocytes,Ua NEGATIVE NEGATIVE   RBC / HPF 0-5 0 - 5 RBC/hpf   WBC, UA 11-20 0 - 5 WBC/hpf   Bacteria, UA RARE (A) NONE SEEN   Squamous Epithelial / LPF 0-5 0 - 5   Mucus PRESENT    Granular Casts, UA PRESENT     Comment: Performed at [facility], [place] [address], [place], [place] [address]   Ct Abdomen Pelvis W Contrast  Result Date: 07/30/2018 CLINICAL DATA:  Acute abdominal pain. EXAM: CT ABDOMEN AND PELVIS WITH CONTRAST TECHNIQUE: Multidetector CT imaging of the abdomen and pelvis was performed using the standard protocol following bolus administration of intravenous contrast. CONTRAST:  126mL OMNIPAQUE IOHEXOL 300 MG/ML  SOLN COMPARISON:  10/19/2004 FINDINGS: Lower chest: Small right pleural effusion. Hepatobiliary: Small cyst in left hepatic lobe. No other hepatic mass. No gallstones, gallbladder wall thickening, or biliary dilatation. Pancreas: Unremarkable. No pancreatic ductal dilatation or surrounding inflammatory changes. Spleen: Normal in size without focal abnormality. Adrenals/Urinary Tract: Adrenal glands are unremarkable. Kidneys are normal, without renal calculi, focal lesion, or hydronephrosis. Bladder is unremarkable. Stomach/Bowel: Severe distension of the stomach with a large air-fluid level. Severe dilatation of the small bowel measuring up to 4 cm with numerous air-fluid levels. Distal small bowel is decompressed. Relative transition point in the right mid anterior abdomen which may be due to an internal hernia. No pneumatosis, pneumoperitoneum or portal venous gas. Normal appendix. Vascular/Lymphatic: Normal caliber abdominal aorta with mild atherosclerosis. No lymphadenopathy. Reproductive: Enlarged prostate gland measuring 6.5 x 6.7 cm. Other: No abdominal wall hernia or abnormality. No abdominopelvic ascites. Musculoskeletal: One no acute osseous abnormality. No aggressive osseous lesion. Grade 1 anterolisthesis of L4 on L5 secondary  to facet disease. IMPRESSION: 1. High-grade small bowel obstruction with a relative transition point in the right mid anterior abdomen. 2. Small right pleural effusion. 3. Prostatic enlargement. Electronically Signed   By: Kathreen Devoid   On: 07/30/2018 13:40    Pending Labs Unresulted Labs (From admission, onward)   None      Vitals/Pain Today's Vitals   07/30/18 1215 07/30/18 1430 07/30/18 1433 07/30/18 1528  BP: (!) 168/94 (!) 173/85 (!) 173/85   Pulse: 87 94 96   Resp:   18   Temp:      TempSrc:      SpO2: 94% 91% 95%   Weight:      Height:      PainSc:    4     Isolation Precautions No active isolations  Medications Medications  morphine 4 MG/ML injection 6 mg (6 mg Intravenous Given 07/30/18 1026)  ondansetron (ZOFRAN) injection 4 mg (4 mg Intravenous Given 07/30/18 1026)  sodium chloride 0.9 % bolus 1,000 mL (0 mLs Intravenous Stopped 07/30/18 1201)  metoCLOPramide (REGLAN) injection 10 mg (10 mg Intravenous Given 07/30/18 1334)  iohexol (OMNIPAQUE) 300 MG/ML solution 100 mL (100 mLs Intravenous Contrast Given 07/30/18 1330)  lidocaine (PF) (XYLOCAINE) 1 % injection 5 mL (2 mLs Intradermal Given 07/30/18 1524)    Mobility walks Low fall risk   Focused Assessments    R Recommendations: See Admitting Provider Note  Report given to:   Additional Notes:

## 2018-07-30 NOTE — Progress Notes (Signed)
Patient hooked up to suction. Will suction for 2 hours and then administer gastrografin.

## 2018-07-30 NOTE — Progress Notes (Signed)
ANTICOAGULATION CONSULT NOTE  Pharmacy Consult: Argatroban Indication: atrial fibrillation   Patient Measurements: Height: 5\' 11"  (180.3 cm) Weight: 151 lb (68.5 kg) IBW/kg (Calculated) : 75.3  Vital Signs: Temp: 99.3 F (37.4 C) (04/30 1939) Temp Source: Oral (04/30 1939) BP: 131/74 (04/30 1939) Pulse Rate: 92 (04/30 1939)  Labs: Recent Labs    07/30/18 1009 07/30/18 2101  HGB 16.1  --   HCT 49.0  --   PLT 140*  --   APTT  --  39*  CREATININE 1.38*  --      Assessment: 85 yo male on Eliquis PTA for AFib. He has not had a dose in a couple days. Originally planned to heparin infusion; however, the patient has an allergy listed to heparin. I discussed this with him, he would prefer not to have a medication that contains pork products. SCr 1.38, AST + Tbili midly elevated, ALT ok.    First aPTT level is sub-therapeutic at 39 sec.  RN reports that the pump beeps a little bit because patient was bending his arm where the med is infusing.  Issue has resolved but aPTT might be a little falsely low.  No bleeding reported.  Goal of Therapy:  APTT 50-85 sec Monitor platelets by anticoagulation protocol: Yes   Plan:  Increase argatroban to 0.7 mcg/kg/min Check aPTT 2 hrs post rate adjustment  Randy Fuller, PharmD, BCPS, [place] 07/30/2018, 9:55 PM

## 2018-07-30 NOTE — ED Notes (Signed)
The pt vomited after receiving contrast in CT.

## 2018-07-31 ENCOUNTER — Inpatient Hospital Stay (HOSPITAL_COMMUNITY): Payer: Medicare HMO

## 2018-07-31 DIAGNOSIS — K56609 Unspecified intestinal obstruction, unspecified as to partial versus complete obstruction: Principal | ICD-10-CM

## 2018-07-31 DIAGNOSIS — I482 Chronic atrial fibrillation, unspecified: Secondary | ICD-10-CM

## 2018-07-31 LAB — BASIC METABOLIC PANEL
Anion gap: 12 (ref 5–15)
BUN: 23 mg/dL (ref 8–23)
CO2: 29 mmol/L (ref 22–32)
Calcium: 9.3 mg/dL (ref 8.9–10.3)
Chloride: 101 mmol/L (ref 98–111)
Creatinine, Ser: 1.27 mg/dL — ABNORMAL HIGH (ref 0.61–1.24)
GFR calc Af Amer: >60 mL/min (ref >60)
GFR calc non Af Amer: 52 mL/min — ABNORMAL LOW (ref >60)
Glucose, Bld: 104 mg/dL — ABNORMAL HIGH (ref 70–99)
Potassium: 3.4 mmol/L — ABNORMAL LOW (ref 3.5–5.1)
Sodium: 142 mmol/L (ref 135–145)

## 2018-07-31 LAB — APTT
aPTT: 58 seconds — ABNORMAL HIGH (ref 24–36)
aPTT: 46 seconds — ABNORMAL HIGH (ref 24–36)
aPTT: 48 seconds — ABNORMAL HIGH (ref 24–36)
aPTT: 51 seconds — ABNORMAL HIGH (ref 24–36)
aPTT: 50 seconds — ABNORMAL HIGH (ref 24–36)

## 2018-07-31 LAB — CBC
HCT: 40 % (ref 39.0–52.0)
Hemoglobin: 13.6 g/dL (ref 13.0–17.0)
MCH: 30.2 pg (ref 26.0–34.0)
MCHC: 34 g/dL (ref 30.0–36.0)
MCV: 88.7 fL (ref 80.0–100.0)
Platelets: 128 10*3/uL — ABNORMAL LOW (ref 150–400)
RBC: 4.51 MIL/uL (ref 4.22–5.81)
RDW: 15.1 % (ref 11.5–15.5)
WBC: 2.8 10*3/uL — ABNORMAL LOW (ref 4.0–10.5)
nRBC: 0 % (ref 0.0–0.2)

## 2018-07-31 LAB — MAGNESIUM: Magnesium: 2.1 mg/dL (ref 1.7–2.4)

## 2018-07-31 MED ORDER — ARGATROBAN 50 MG/50ML IV SOLN
1.4000 ug/kg/min | INTRAVENOUS | Status: DC
  Administered 2018-07-31: 11:00:00 0.85 ug/kg/min via INTRAVENOUS
  Administered 2018-07-31: 1.1 ug/kg/min via INTRAVENOUS
  Administered 2018-08-01: 07:00:00 1.4 ug/kg/min via INTRAVENOUS
  Filled 2018-07-31 (×2): qty 50

## 2018-07-31 MED ORDER — POTASSIUM CHLORIDE 10 MEQ/100ML IV SOLN
10.0000 meq | INTRAVENOUS | Status: AC
  Administered 2018-07-31 (×3): 10 meq via INTRAVENOUS
  Filled 2018-07-31 (×3): qty 100

## 2018-07-31 MED ORDER — MENTHOL 3 MG MT LOZG
1.0000 | LOZENGE | OROMUCOSAL | Status: DC | PRN
  Administered 2018-07-31: 3 mg via ORAL
  Filled 2018-07-31: qty 9

## 2018-07-31 NOTE — Progress Notes (Signed)
ANTICOAGULATION CONSULT NOTE  Pharmacy Consult: Argatroban Indication: atrial fibrillation   Patient Measurements: Height: 5\' 11"  (180.3 cm) Weight: 151 lb (68.5 kg) IBW/kg (Calculated) : 75.3  Vital Signs: Temp: 99.3 F (37.4 C) (04/30 1939) Temp Source: Oral (04/30 1939) BP: 131/74 (04/30 1939) Pulse Rate: 92 (04/30 1939)  Labs: Recent Labs    07/30/18 1009 07/30/18 2101 07/31/18 0129  HGB 16.1  --  13.6  HCT 49.0  --  40.0  PLT 140*  --  128*  APTT  --  39* 58*  CREATININE 1.38*  --  1.27*     Assessment: 85 yo male on Eliquis PTA for AFib. He has not had a dose in a couple days. Originally planned to heparin infusion; however, the patient has an allergy listed to heparin. I discussed this with him, he would prefer not to have a medication that contains pork products. SCr 1.38, AST + Tbili midly elevated, ALT ok.    Second aPTT level therapeutic at 58 seconds s/p increase to 0.7 mcg/kg/min  Goal of Therapy:  APTT 50-85 sec Monitor platelets by anticoagulation protocol: Yes   Plan:  Continue argatroban at 0.7 mcg/kg/min Next aPTT with AM labs to confirm  Bertis Ruddy, PharmD Clinical Pharmacist Please check AMION for all [place] numbers 07/31/2018 3:42 AM

## 2018-07-31 NOTE — Progress Notes (Signed)
ANTICOAGULATION CONSULT NOTE  Pharmacy Consult: Argatroban Indication: atrial fibrillation   Patient Measurements: Height: 5\' 11"  (180.3 cm) Weight: 151 lb (68.5 kg) IBW/kg (Calculated) : 75.3  Vital Signs: Temp: 98.2 F (36.8 C) (05/01 0506) Temp Source: Oral (05/01 0506) BP: 149/86 (05/01 0506) Pulse Rate: 78 (05/01 0506)  Labs: Recent Labs    07/30/18 1009 07/30/18 2101 07/31/18 0129 07/31/18 0916  HGB 16.1  --  13.6  --   HCT 49.0  --  40.0  --   PLT 140*  --  128*  --   APTT  --  39* 58* 46*  CREATININE 1.38*  --  1.27*  --      Assessment: 85 yo male on Eliquis PTA for AFib. He has not had a dose in a couple days. Originally planned to heparin infusion; however, the patient has an allergy listed to heparin. I discussed this with him, he would prefer not to have a medication that contains pork products. SCr 1.38, AST + Tbili midly elevated, ALT ok.    Today, 07/31/2018 0129 aPTT 58 sec, on 0.7 mg/kg/min Repeat aPTT ordered for 5am - not collected Reordered aPTT for 0900, 0916 aPTT 46 sec, below desired range - increase rate to 0.85 mcg/kg/min Repeat aPTT 1400 = 48 sec, still below desired range despite rate increase  Goal of Therapy:  APTT 50-85 sec Monitor platelets by anticoagulation protocol: Yes   Plan:  Increase argatroban to 1 mcg/kg/min Next aPTT at 1800 Daily aPTT ordered CBC in am 5/2  Minda Ditto PharmD [phone] Clinical Pharmacist Please check AMION for all [place] numbers 07/31/2018 10:20 AM

## 2018-07-31 NOTE — Progress Notes (Addendum)
Family Medicine Teaching Service Daily Progress Note Intern Pager: [phone]  Patient name: Randy Fuller Medical record number: [id number] Date of birth: 08-19-1933 Age: 85 y.o. Gender: male  Primary Care Provider: Derinda Late, MD Consultants: General Surgery Code Status: Full   Pt Overview and Major Events to Date:  4/30 Admitted to [facility], NG tube placed  Assessment and Plan: Randy Fuller is a 85 y.o. male presenting with 4 days abdominal pain. PMH is significant for Atrial Fibrillation on Eliquis, Diverticulosis, HTN, HLD,   Small Bowel Obstruction CT Abd Pelvis with SBO.  Ng tube placed by surgery for SBO protocol.  1.9L output from NG tube since admission.  Required 1 prn of morphine overnight.  Pain this AM improved, but continued distention.  Patient notes pain regimen works well.  AXR this AM 8 hr delay film showed continued SBO. - cardiac monitoring - OOB with assistance - General Surgery consulted, appreciate recs - SBO protocol: NG tube - AXR in AM - mIVF NS @ 100 cc/hr - IV tylenol sch q6h - IV morphine 1mg  q2h prn breakthrough pain, can increase to 2mg  q2h prn if required  - IV zofran 4mg  q6h prn nausea - NPO - Argatroban per pharmacy for A Fib given NPO, will need to stop 6 hrs prior to procedure if occurs (patient refuses pork products) - PT/OT   Atrial Fibrillation on Eliquis and Bradycardia Recently seen by Cardiologist, Dr. Gwenlyn Found, for fatigue and bradycardia to 30s-40s.  He was referred to EP, but has not yet been seen by them.  Last Echo 09/2017 with EF 55-60%, RA and LA severly dilated, mod increased Pulm Artery Pressure, cardiologist notes suggestive of possible amyloid, but patient was asymptomatic and decided to not work this up.  HR this AM 78. - EKG - hold Eliquis while NPO - argatroban per pharmacy for A Fib while NPO and pending surgery - consider EP eval while inpatient if bradycardic  - cardiac telemetry  HTN On norvasc 2.5mg  QD and  losartan 50mg  QD at home.  Will need to hold home oral BP meds given NPO with SBO.  BP this AM 149/86. - hold home meds while NPO - can use hydral if HTN urgency   HLD On Crestor 40mg  QD at home. - hold crestor while NPO  Gout On Allopurinol 450mg  QD at home. - hold allopurinol while NPO  Glaucoma Patient uses Cosopt and Xalatan at home.   - cont home gtts  BPH Patient reports some chronic difficulty with urination.  On finasteride and tamsulosin at home. UOP 500cc with 1 unmeasured overnight. - hold home meds while NPO - can in and out cath if needed for urinary retention  CKD 3A Cr 1.27 this AM. BL 1.2-13 - avoid nephrotoxic agents  Mild Hypokalemia H 5.2>3.4.   - check Mag  - monitor K - KCl 10 mEq x3  FEN/GI: NPO PPx: Argatroban   Disposition: continued inpatient stay, pending PT/OT recs  Subjective:  Patient notes that he is feeling better than yesterday but his "stomach is still big."  Notes that pain regimen is working.  No other complaints, no BMs.  Objective: Temp:  [97.3 F (36.3 C)-99.3 F (37.4 C)] 98.2 F (36.8 C) (05/01 0506) Pulse Rate:  [78-100] 78 (05/01 0506) Resp:  [16-19] 18 (05/01 0506) BP: (131-176)/(74-103) 149/86 (05/01 0506) SpO2:  [90 %-96 %] 90 % (05/01 0506) Weight:  [68.5 kg] 68.5 kg (04/30 1003)  Physical Exam: General: 85 y.o. male in NAD  HEENT: NG tube in place Cardio: irregularly irregular Lungs: CTAB, no wheezing, no rhonchi, no crackles, no increased WOB Abdomen: distended, increased tympany to percussion, increased bowel sounds in upper abdomen, decreased bowel sounds in lower abdomen Skin: warm and dry Extremities: No edema   Laboratory: Recent Labs  Lab 07/30/18 1009 07/31/18 0129  WBC 4.4 2.8*  HGB 16.1 13.6  HCT 49.0 40.0  PLT 140* 128*   Recent Labs  Lab 07/30/18 1009 07/31/18 0129  NA 139 142  K 5.2* 3.4*  CL 100 101  CO2 26 29  BUN 28* 23  CREATININE 1.38* 1.27*  CALCIUM 10.0 9.3  PROT  7.7  --   BILITOT 2.1*  --   ALKPHOS 85  --   ALT 16  --   AST 54*  --   GLUCOSE 138* 104*    Imaging/Diagnostic Tests: Ct Abdomen Pelvis W Contrast  Result Date: 07/30/2018 CLINICAL DATA:  Acute abdominal pain. EXAM: CT ABDOMEN AND PELVIS WITH CONTRAST TECHNIQUE: Multidetector CT imaging of the abdomen and pelvis was performed using the standard protocol following bolus administration of intravenous contrast. CONTRAST:  173mL OMNIPAQUE IOHEXOL 300 MG/ML  SOLN COMPARISON:  10/19/2004 FINDINGS: Lower chest: Small right pleural effusion. Hepatobiliary: Small cyst in left hepatic lobe. No other hepatic mass. No gallstones, gallbladder wall thickening, or biliary dilatation. Pancreas: Unremarkable. No pancreatic ductal dilatation or surrounding inflammatory changes. Spleen: Normal in size without focal abnormality. Adrenals/Urinary Tract: Adrenal glands are unremarkable. Kidneys are normal, without renal calculi, focal lesion, or hydronephrosis. Bladder is unremarkable. Stomach/Bowel: Severe distension of the stomach with a large air-fluid level. Severe dilatation of the small bowel measuring up to 4 cm with numerous air-fluid levels. Distal small bowel is decompressed. Relative transition point in the right mid anterior abdomen which may be due to an internal hernia. No pneumatosis, pneumoperitoneum or portal venous gas. Normal appendix. Vascular/Lymphatic: Normal caliber abdominal aorta with mild atherosclerosis. No lymphadenopathy. Reproductive: Enlarged prostate gland measuring 6.5 x 6.7 cm. Other: No abdominal wall hernia or abnormality. No abdominopelvic ascites. Musculoskeletal: One no acute osseous abnormality. No aggressive osseous lesion. Grade 1 anterolisthesis of L4 on L5 secondary to facet disease. IMPRESSION: 1. High-grade small bowel obstruction with a relative transition point in the right mid anterior abdomen. 2. Small right pleural effusion. 3. Prostatic enlargement. Electronically Signed    By: Kathreen Devoid   On: 07/30/2018 13:40   Dg Abd Portable 1v-small Bowel Obstruction Protocol-initial, 8 Hr Delay  Result Date: 07/31/2018 CLINICAL DATA:  Small bowel obstruction EXAM: PORTABLE ABDOMEN - 1 VIEW COMPARISON:  Radiograph of July 30, 2018. FINDINGS: Stable small bowel dilatation is noted concerning for distal small bowel obstruction. No colonic dilatation is noted. Phleboliths are noted in the pelvis. Nasogastric tube is seen in the proximal stomach. IMPRESSION: Stable small bowel dilatation is noted concerning for distal small bowel obstruction. Electronically Signed   By: Marijo Conception M.D.   On: 07/31/2018 07:40   Dg Abd Portable 1v-small Bowel Protocol-position Verification  Result Date: 07/30/2018 CLINICAL DATA:  85 year old male with gastric tube placement EXAM: PORTABLE ABDOMEN - 1 VIEW COMPARISON:  07/30/2018 FINDINGS: Reposition of the gastric tube which terminates in the stomach, left upper quadrant. Similar appearance of distended small bowel. Retained contrast in the urinary bladder. IMPRESSION: Gastric tube has been repositioned, terminating within the stomach in the left upper quadrant Electronically Signed   By: Corrie Mckusick D.O.   On: 07/30/2018 18:54   Dg Abd Portable 1 View  Result Date: 07/30/2018 CLINICAL DATA:  Nasogastric tube placement EXAM: PORTABLE ABDOMEN - 1 VIEW COMPARISON:  CT abdomen and pelvis July 30, 2018 FINDINGS: Nasogastric tube tip and side port are in the stomach. The tube loops upon itself with the tip near the gastroesophageal junction. There are loops of dilated bowel in a pattern indicative of obstruction. No free air. Contrast is present in the urinary bladder. IMPRESSION: Nasogastric tube tip and side-port in the stomach. The tube loops upon itself with the tip essentially at the gastroesophageal junction. Advise advancing the nasogastric tube 5-6 cm. Bowel dilatation in a pattern suggesting bowel obstruction. No free air. Electronically Signed    By: Lowella Grip III M.D.   On: 07/30/2018 16:38    Kaidyn Javid, Bernita Raisin, DO 07/31/2018, 8:02 AM PGY-1, [place] Intern pager: [phone], text pages welcome

## 2018-07-31 NOTE — Progress Notes (Signed)
Followed up with radiology regarding abdominal xray for post SBO protocol supposed to be scheduled at 0530.  Radiology informed this RN that a radiology tech will be coming up to do the procedure.  Still awaitng the said tech.  Patient attempted to use the [facility] but only passed gas and urinated.  Patient now back in bed.

## 2018-07-31 NOTE — Progress Notes (Addendum)
ANTICOAGULATION CONSULT NOTE  Pharmacy Consult: Argatroban Indication: atrial fibrillation   Patient Measurements: Height: 5\' 11"  (180.3 cm) Weight: 151 lb (68.5 kg) IBW/kg (Calculated) : 75.3  Vital Signs: Temp: 98.4 F (36.9 C) (05/01 1452) Temp Source: Oral (05/01 1452) BP: 141/98 (05/01 1452) Pulse Rate: 82 (05/01 1452)  Labs: Recent Labs    07/30/18 1009  07/31/18 0129 07/31/18 0916 07/31/18 1341 07/31/18 1803  HGB 16.1  --  13.6  --   --   --   HCT 49.0  --  40.0  --   --   --   PLT 140*  --  128*  --   --   --   APTT  --    < > 58* 46* 48* 51*  CREATININE 1.38*  --  1.27*  --   --   --    < > = values in this interval not displayed.     Assessment: 85 yo male on Eliquis PTA for AFib. He has not had a dose in a couple days. Originally planned to heparin infusion; however, the patient has an allergy listed to heparin. I discussed this with him, he would prefer not to have a medication that contains pork products.   APTT therapeutic and toward the low end of goal.  No bleeding reported.  Goal of Therapy:  APTT 50-85 sec Monitor platelets by anticoagulation protocol: Yes   Plan:  Increase argatroban to 1.1 mcg/kg/min Repeat aPTT post rate adjustment   Talin Rozeboom D. Mina Marble, PharmD, BCPS, [place] 07/31/2018, 7:21 PM

## 2018-07-31 NOTE — Evaluation (Signed)
Physical Therapy Evaluation Patient Details Name: Randy Fuller MRN: [id number] DOB: 06/13/1933 Today's Date: 07/31/2018   History of Present Illness  Pt is an 85 y.o. male admitted 07/30/18 with abdominal pain and distension. CT showed high grade SBO. Plan for conservative medical management; NGT placed. PMH includes afib on Eliquis, HTN, L inguinal hernia repair (2014).    Clinical Impression  Pt presents with an overall decrease in functional mobility secondary to above. PTA, pt independent, remains active and lives with wife. Today, pt ambulatory with min guard for balance; mobility limited by NGT line unable to remove from suction due to high output per RN. Expect pt to progress well with mobility; discussed potential need for RW upon initial return home for added stability. Will follow acutely to address established goals.    Follow Up Recommendations Home health PT;Supervision for mobility/OOB    Equipment Recommendations  Rolling walker with 5" wheels    Recommendations for Other Services       Precautions / Restrictions Precautions Precautions: Fall;Other (comment) Precaution Comments: NGT Restrictions Weight Bearing Restrictions: No      Mobility  Bed Mobility Overal bed mobility: Modified Independent Bed Mobility: Supine to Sit     Supine to sit: Supervision;HOB elevated     General bed mobility comments: supervision for safety  Transfers Overall transfer level: Needs assistance Equipment used: None Transfers: Sit to/from Stand Sit to Stand: Min guard         General transfer comment: Self-corrected instability upon standing, min guard for balance   Ambulation/Gait Ambulation/Gait assistance: Min guard;Min assist Gait Distance (Feet): 4 Feet Assistive device: None Gait Pattern/deviations: Step-through pattern;Decreased stride length     General Gait Details: Steps from bed to recliner with min guard for balance; performed multiple bouts of  marching at EOB without UE support, 1x LOB requiring minA to correct. (limited by NGT distance, unable to detach this session due to high output per RN)  Stairs            Wheelchair Mobility    Modified Rankin (Stroke Patients Only)       Balance Overall balance assessment: Needs assistance   Sitting balance-Leahy Scale: Good       Standing balance-Leahy Scale: Fair                               Pertinent Vitals/Pain Pain Assessment: Faces Faces Pain Scale: Hurts a little bit Pain Location: Abdomen Pain Descriptors / Indicators: Discomfort;Guarding Pain Intervention(s): Monitored during session    Home Living Family/patient expects to be discharged to:: Private residence Living Arrangements: Spouse/significant other Available Help at Discharge: Family;Available 24 hours/day Type of Home: House Home Access: Stairs to enter Entrance Stairs-Rails: Right;Left(front door has rails, garage entrance no rails) Entrance Stairs-Number of Steps: 3-5 front door w/ rails; 2 in garage without rails Home Layout: One level Home Equipment: None      Prior Function Level of Independence: Independent         Comments: enjoys gardening     Hand Dominance        Extremity/Trunk Assessment   Upper Extremity Assessment Upper Extremity Assessment: Overall WFL for tasks assessed    Lower Extremity Assessment Lower Extremity Assessment: Generalized weakness       Communication   Communication: No difficulties  Cognition Arousal/Alertness: Awake/alert Behavior During Therapy: WFL for tasks assessed/performed Overall Cognitive Status: Within Functional Limits for tasks assessed  General Comments      Exercises     Assessment/Plan    PT Assessment Patient needs continued PT services  PT Problem List Decreased strength;Decreased activity tolerance;Decreased balance;Decreased mobility        PT Treatment Interventions DME instruction;Gait training;Stair training;Functional mobility training;Therapeutic activities;Therapeutic exercise;Balance training;Patient/family education    PT Goals (Current goals can be found in the Care Plan section)  Acute Rehab PT Goals Patient Stated Goal: to be independent PT Goal Formulation: With patient Time For Goal Achievement: 08/14/18 Potential to Achieve Goals: Good    Frequency Min 3X/week   Barriers to discharge        Co-evaluation               AM-PAC PT "6 Clicks" Mobility  Outcome Measure Help needed turning from your back to your side while in a flat bed without using bedrails?: None Help needed moving from lying on your back to sitting on the side of a flat bed without using bedrails?: None Help needed moving to and from a bed to a chair (including a wheelchair)?: None Help needed standing up from a chair using your arms (e.g., wheelchair or bedside chair)?: A Little Help needed to walk in hospital room?: A Little Help needed climbing 3-5 steps with a railing? : A Little 6 Click Score: 21    End of Session   Activity Tolerance: Patient tolerated treatment well Patient left: in bed;with call bell/phone within reach Nurse Communication: Mobility status PT Visit Diagnosis: Other abnormalities of gait and mobility (R26.89);Muscle weakness (generalized) (M62.81)    Time: 3154-0086 PT Time Calculation (min) (ACUTE ONLY): 12 min   Charges:   PT Evaluation $PT Eval Moderate Complexity: [place], PT, DPT Acute Rehabilitation Services  Pager [phone] Office [place] 07/31/2018, 3:33 PM

## 2018-07-31 NOTE — Progress Notes (Addendum)
ANTICOAGULATION CONSULT NOTE  Pharmacy Consult: Argatroban Indication: atrial fibrillation   Patient Measurements: Height: 5\' 11"  (180.3 cm) Weight: 151 lb (68.5 kg) IBW/kg (Calculated) : 75.3  Vital Signs: Temp: 98.4 F (36.9 C) (05/01 1452) Temp Source: Oral (05/01 1452) BP: 141/98 (05/01 1452) Pulse Rate: 82 (05/01 1452)  Labs: Recent Labs    07/30/18 1009  07/31/18 0129  07/31/18 1341 07/31/18 1803 07/31/18 2242  HGB 16.1  --  13.6  --   --   --   --   HCT 49.0  --  40.0  --   --   --   --   PLT 140*  --  128*  --   --   --   --   APTT  --    < > 58*   < > 48* 51* 50*  CREATININE 1.38*  --  1.27*  --   --   --   --    < > = values in this interval not displayed.     Assessment: 85 yo male on Eliquis PTA for AFib. He has not had a dose in a couple days. Originally planned to heparin infusion; however, the patient has an allergy listed to heparin. I discussed this with him, he would prefer not to have a medication that contains pork products.   APTT now lower end of therapeutic s/p change to 1.1 mcg/kg/min  Goal of Therapy:  APTT 50-85 sec Monitor platelets by anticoagulation protocol: Yes   Plan:  Increase argatroban to 1.2 mcg/kg/min Next aPTT with AM labs   Addendum: AM aPTT now 49 sec, rate confirmed with RN and no issues with infusion Increase argatroban to 1.4 mcg/kg/min Repeat aPTT 0800  Bertis Ruddy, PharmD Clinical Pharmacist Please check AMION for all [facility] numbers 07/31/2018 11:40 PM

## 2018-07-31 NOTE — Progress Notes (Signed)
Occupational Therapy Evaluation Patient Details Name: Randy Fuller MRN: [id number] DOB: Oct 11, 1933 Today's Date: 07/31/2018    History of Present Illness Pt is an 85 y.o. male admitted 07/30/18 with abdominal pain and distension. CT showed high grade SBO. Plan for conservative medical management; NGT placed. PMH includes afib on Eliquis, HTN, L inguinal hernia repair (2014).   Clinical Impression   Pt admitted with above diagnosis. PTA, pt is independent and lives with wife.  He was very motivated and eager to work with therapy.  Supervision-min assist for ADLs.  Completed functional transfer from bed to chair with min guard assist. Pt presents with decreased activity tolerance and unsteadiness on feet. Recommending discharge home with 24/7 supervision/assist. At this time, do not anticipate any DME needed but will continue to assess.  Will follow acutely in order to address deficits listed below.    Follow Up Recommendations  No OT follow up;Supervision/Assistance - 24 hour    Equipment Recommendations  None recommended by OT    Recommendations for Other Services PT consult     Precautions / Restrictions Restrictions Weight Bearing Restrictions: No      Mobility Bed Mobility Overal bed mobility: Needs Assistance Bed Mobility: Supine to Sit     Supine to sit: Supervision;HOB elevated     General bed mobility comments: supervision for safety  Transfers Overall transfer level: Needs assistance Equipment used: 1 person hand held assist Transfers: Sit to/from Stand Sit to Stand: Min guard              Balance                                           ADL either performed or assessed with clinical judgement   ADL Overall ADL's : Needs assistance/impaired Eating/Feeding: NPO   Grooming: Oral care;Wash/dry face;Supervision/safety;Sitting   Upper Body Bathing: Supervision/ safety;Sitting   Lower Body Bathing: Min guard;Sit to/from stand    Upper Body Dressing : Supervision/safety;Sitting   Lower Body Dressing: Minimal assistance;Sit to/from stand   Toilet Transfer: Min guard;Ambulation Toilet Transfer Details (indicate cue type and reason): simulated with transfer from bed to [place] and Hygiene: Min guard;Sit to/from stand       Functional mobility during ADLs: Min guard General ADL Comments: Pt ambulated to recliner with min guard assist without device. Completed grooming ADLs in chair.     Vision         Perception     Praxis      Pertinent Vitals/Pain Pain Assessment: No/denies pain     Hand Dominance     Extremity/Trunk Assessment Upper Extremity Assessment Upper Extremity Assessment: Overall WFL for tasks assessed   Lower Extremity Assessment Lower Extremity Assessment: Defer to PT evaluation       Communication Communication Communication: No difficulties   Cognition Arousal/Alertness: Awake/alert Behavior During Therapy: WFL for tasks assessed/performed Overall Cognitive Status: Within Functional Limits for tasks assessed                                     General Comments       Exercises     Shoulder Instructions      Home Living Family/patient expects to be discharged to:: Private residence Living Arrangements: Spouse/significant other Available Help at Discharge: Family;Available 24 hours/day  Type of Home: House Home Access: Stairs to enter [employer] of Steps: 3-5 Entrance Stairs-Rails: Right;Left(front door has rails, garage entrance no rails) Home Layout: One level     Bathroom Shower/Tub: Tub/shower unit;Door   [employer]: (comfort height)     Home Equipment: None          Prior Functioning/Environment Level of Independence: Independent        Comments: enjoys gardening        OT Problem List: Decreased strength;Decreased activity tolerance;Impaired balance (sitting and/or  standing);Decreased knowledge of use of DME or AE      OT Treatment/Interventions: Self-care/ADL training;DME and/or AE instruction;Energy conservation;Therapeutic activities;Patient/family education;Balance training    OT Goals(Current goals can be found in the care plan section) Acute Rehab OT Goals Patient Stated Goal: to be independent OT Goal Formulation: With patient Time For Goal Achievement: 08/14/18 Potential to Achieve Goals: Good  OT Frequency: Min 2X/week   Barriers to D/C:            Co-evaluation              AM-PAC OT "6 Clicks" Daily Activity     Outcome Measure Help from another person eating meals?: Total(NPO) Help from another person taking care of personal grooming?: A Little Help from another person toileting, which includes using toliet, bedpan, or urinal?: A Little Help from another person bathing (including washing, rinsing, drying)?: A Little Help from another person to put on and taking off regular upper body clothing?: A Little Help from another person to put on and taking off regular lower body clothing?: A Little 6 Click Score: 16   End of Session Nurse Communication: Mobility status  Activity Tolerance: Patient tolerated treatment well Patient left: in chair;with call bell/phone within reach  OT Visit Diagnosis: Unsteadiness on feet (R26.81);Muscle weakness (generalized) (M62.81)                Time: 1030-1110 OT Time Calculation (min): 40 min Charges:  OT General Charges $OT Visit: 1 Visit OT Evaluation $OT Eval Moderate Complexity: 1 Mod OT Treatments $Self Care/Home Management : 8-22 mins $Therapeutic Activity: 8-22 mins    Darrol Jump OTR/L [place] [phone] 07/31/2018, 12:48 PM

## 2018-07-31 NOTE — Progress Notes (Signed)
[facility]/Trauma Progress Note      Assessment/Plan A fib - on eliquis, last dose on Tuesday, please hold.  Ok for heparin gtt if needed HTN - defer to medicine  Small Bowel Obstruction - high grade SBO on CT scan - hx of laparoscopic inguinal hernia repair  - SBO protocol 8hr delay film did not show any contrast and continued SBO - hopefully this will resolve with out the need for surgery. Low threshold for surgery in this pt since his only previous surgical hx is lap hernia repair.  - AXR tomorrow AM  - we will follow  - spoke with wife Yosgar Demirjian, [phone]  FEN: NPO, NGT VTE: SCD's, argatroban ID: none Follow up: TBD    LOS: 1 day    Subjective: CC: abdominal soreness  He states one episode of flatus this am. He feels his abdomin is less bloated. He is feeling better overall. He is asking if he can eat. I explained his diagnosis and potential surgery. He has asked that I call his wife to explain to her.   Objective: Vital signs in last 24 hours: Temp:  [97.3 F (36.3 C)-99.3 F (37.4 C)] 98.2 F (36.8 C) (05/01 0506) Pulse Rate:  [78-100] 78 (05/01 0506) Resp:  [16-19] 18 (05/01 0506) BP: (131-176)/(74-103) 149/86 (05/01 0506) SpO2:  [90 %-96 %] 90 % (05/01 0506) Weight:  [68.5 kg] 68.5 kg (04/30 1003)    Intake/Output from previous day: 04/30 0701 - 05/01 0700 In: 2437.3 [I.V.:1137.3; IV Piggyback:1300] Out: 2400 [Urine:500; Emesis/NG output:1900] Intake/Output this shift: No intake/output data recorded.  PE: Gen:  Alert, NAD, pleasant, cooperative HEENT: NGT in place with dark output Pulm:  Rate and effort normal Abd: Soft, distented, hypoactive BS, very mild generalized TTP without guarding Skin: no rashes noted, warm and dry   Anti-infectives: Anti-infectives (From admission, onward)   None      Lab Results:  Recent Labs    07/30/18 1009 07/31/18 0129  WBC 4.4 2.8*  HGB 16.1 13.6  HCT 49.0 40.0  PLT 140* 128*    BMET Recent Labs    07/30/18 1009 07/31/18 0129  NA 139 142  K 5.2* 3.4*  CL 100 101  CO2 26 29  GLUCOSE 138* 104*  BUN 28* 23  CREATININE 1.38* 1.27*  CALCIUM 10.0 9.3   PT/INR No results for input(s): LABPROT, INR in the last 72 hours. CMP     Component Value Date/Time   NA 142 07/31/2018 0129   NA 145 (H) 07/21/2018 0906   K 3.4 (L) 07/31/2018 0129   CL 101 07/31/2018 0129   CO2 29 07/31/2018 0129   GLUCOSE 104 (H) 07/31/2018 0129   BUN 23 07/31/2018 0129   BUN 18 07/21/2018 0906   CREATININE 1.27 (H) 07/31/2018 0129   CALCIUM 9.3 07/31/2018 0129   PROT 7.7 07/30/2018 1009   ALBUMIN 4.1 07/30/2018 1009   AST 54 (H) 07/30/2018 1009   ALT 16 07/30/2018 1009   ALKPHOS 85 07/30/2018 1009   BILITOT 2.1 (H) 07/30/2018 1009   GFRNONAA 52 (L) 07/31/2018 0129   GFRAA >60 07/31/2018 0129   Lipase     Component Value Date/Time   LIPASE 66 (H) 07/30/2018 1009    Studies/Results: Ct Abdomen Pelvis W Contrast  Result Date: 07/30/2018 CLINICAL DATA:  Acute abdominal pain. EXAM: CT ABDOMEN AND PELVIS WITH CONTRAST TECHNIQUE: Multidetector CT imaging of the abdomen and pelvis was performed using the standard protocol following bolus administration of intravenous  contrast. CONTRAST:  140mL OMNIPAQUE IOHEXOL 300 MG/ML  SOLN COMPARISON:  10/19/2004 FINDINGS: Lower chest: Small right pleural effusion. Hepatobiliary: Small cyst in left hepatic lobe. No other hepatic mass. No gallstones, gallbladder wall thickening, or biliary dilatation. Pancreas: Unremarkable. No pancreatic ductal dilatation or surrounding inflammatory changes. Spleen: Normal in size without focal abnormality. Adrenals/Urinary Tract: Adrenal glands are unremarkable. Kidneys are normal, without renal calculi, focal lesion, or hydronephrosis. Bladder is unremarkable. Stomach/Bowel: Severe distension of the stomach with a large air-fluid level. Severe dilatation of the small bowel measuring up to 4 cm with numerous  air-fluid levels. Distal small bowel is decompressed. Relative transition point in the right mid anterior abdomen which may be due to an internal hernia. No pneumatosis, pneumoperitoneum or portal venous gas. Normal appendix. Vascular/Lymphatic: Normal caliber abdominal aorta with mild atherosclerosis. No lymphadenopathy. Reproductive: Enlarged prostate gland measuring 6.5 x 6.7 cm. Other: No abdominal wall hernia or abnormality. No abdominopelvic ascites. Musculoskeletal: One no acute osseous abnormality. No aggressive osseous lesion. Grade 1 anterolisthesis of L4 on L5 secondary to facet disease. IMPRESSION: 1. High-grade small bowel obstruction with a relative transition point in the right mid anterior abdomen. 2. Small right pleural effusion. 3. Prostatic enlargement. Electronically Signed   By: Kathreen Devoid   On: 07/30/2018 13:40   Dg Abd Portable 1v-small Bowel Obstruction Protocol-initial, 8 Hr Delay  Result Date: 07/31/2018 CLINICAL DATA:  Small bowel obstruction EXAM: PORTABLE ABDOMEN - 1 VIEW COMPARISON:  Radiograph of July 30, 2018. FINDINGS: Stable small bowel dilatation is noted concerning for distal small bowel obstruction. No colonic dilatation is noted. Phleboliths are noted in the pelvis. Nasogastric tube is seen in the proximal stomach. IMPRESSION: Stable small bowel dilatation is noted concerning for distal small bowel obstruction. Electronically Signed   By: Marijo Conception M.D.   On: 07/31/2018 07:40   Dg Abd Portable 1v-small Bowel Protocol-position Verification  Result Date: 07/30/2018 CLINICAL DATA:  85 year old male with gastric tube placement EXAM: PORTABLE ABDOMEN - 1 VIEW COMPARISON:  07/30/2018 FINDINGS: Reposition of the gastric tube which terminates in the stomach, left upper quadrant. Similar appearance of distended small bowel. Retained contrast in the urinary bladder. IMPRESSION: Gastric tube has been repositioned, terminating within the stomach in the left upper quadrant  Electronically Signed   By: Corrie Mckusick D.O.   On: 07/30/2018 18:54   Dg Abd Portable 1 View  Result Date: 07/30/2018 CLINICAL DATA:  Nasogastric tube placement EXAM: PORTABLE ABDOMEN - 1 VIEW COMPARISON:  CT abdomen and pelvis July 30, 2018 FINDINGS: Nasogastric tube tip and side port are in the stomach. The tube loops upon itself with the tip near the gastroesophageal junction. There are loops of dilated bowel in a pattern indicative of obstruction. No free air. Contrast is present in the urinary bladder. IMPRESSION: Nasogastric tube tip and side-port in the stomach. The tube loops upon itself with the tip essentially at the gastroesophageal junction. Advise advancing the nasogastric tube 5-6 cm. Bowel dilatation in a pattern suggesting bowel obstruction. No free air. Electronically Signed   By: Lowella Grip III M.D.   On: 07/30/2018 16:38      Kalman Drape , [facility] Surgery 07/31/2018, 8:50 AM  Pager: [phone] Mon-Wed, Friday 7:00am-4:30pm Thurs 7am-11:30am  Consults: [phone]

## 2018-08-01 ENCOUNTER — Inpatient Hospital Stay (HOSPITAL_COMMUNITY): Payer: Medicare HMO

## 2018-08-01 LAB — BASIC METABOLIC PANEL
Anion gap: 14 (ref 5–15)
BUN: 22 mg/dL (ref 8–23)
CO2: 29 mmol/L (ref 22–32)
Calcium: 9.4 mg/dL (ref 8.9–10.3)
Chloride: 105 mmol/L (ref 98–111)
Creatinine, Ser: 1.36 mg/dL — ABNORMAL HIGH (ref 0.61–1.24)
GFR calc Af Amer: 55 mL/min — ABNORMAL LOW (ref >60)
GFR calc non Af Amer: 48 mL/min — ABNORMAL LOW (ref >60)
Glucose, Bld: 74 mg/dL (ref 70–99)
Potassium: 3.6 mmol/L (ref 3.5–5.1)
Sodium: 148 mmol/L — ABNORMAL HIGH (ref 135–145)

## 2018-08-01 LAB — APTT
aPTT: 49 seconds — ABNORMAL HIGH (ref 24–36)
aPTT: 50 seconds — ABNORMAL HIGH (ref 24–36)
aPTT: 52 seconds — ABNORMAL HIGH (ref 24–36)

## 2018-08-01 LAB — CBC
HCT: 44.4 % (ref 39.0–52.0)
Hemoglobin: 14.3 g/dL (ref 13.0–17.0)
MCH: 29.7 pg (ref 26.0–34.0)
MCHC: 32.2 g/dL (ref 30.0–36.0)
MCV: 92.3 fL (ref 80.0–100.0)
Platelets: 135 10*3/uL — ABNORMAL LOW (ref 150–400)
RBC: 4.81 MIL/uL (ref 4.22–5.81)
RDW: 15.1 % (ref 11.5–15.5)
WBC: 5.1 10*3/uL (ref 4.0–10.5)
nRBC: 0 % (ref 0.0–0.2)

## 2018-08-01 MED ORDER — ARGATROBAN 50 MG/50ML IV SOLN
1.8000 ug/kg/min | INTRAVENOUS | Status: DC
  Administered 2018-08-01: 14:00:00 1.7 ug/kg/min via INTRAVENOUS
  Administered 2018-08-02 – 2018-08-05 (×11): 1.8 ug/kg/min via INTRAVENOUS
  Filled 2018-08-01 (×15): qty 50

## 2018-08-01 MED ORDER — ACETAMINOPHEN 10 MG/ML IV SOLN
500.0000 mg | Freq: Four times a day (QID) | INTRAVENOUS | Status: AC
  Administered 2018-08-01 – 2018-08-02 (×2): 1000 mg via INTRAVENOUS
  Administered 2018-08-02 (×2): 500 mg via INTRAVENOUS
  Filled 2018-08-01 (×4): qty 100

## 2018-08-01 MED ORDER — SODIUM CHLORIDE 0.45 % IV SOLN
INTRAVENOUS | Status: DC
  Administered 2018-08-01: 21:00:00 100 mL/h via INTRAVENOUS
  Administered 2018-08-01 – 2018-08-03 (×4): via INTRAVENOUS

## 2018-08-01 NOTE — Discharge Summary (Signed)
[facility] Discharge Summary  Patient name: Randy Fuller Medical record number: [id number] Date of birth: [birth year] Age: 85 y.o. Gender: male Date of Admission: 07/30/2018  Date of Discharge: 08/05/18 Admitting Physician: Alveda Reasons, MD  Primary Care Provider: Derinda Late, MD Consultants: General surgery  Indication for Hospitalization: SBO  Discharge Diagnoses/Problem List:  Active Problems:   Small bowel obstruction [facility])   Chronic atrial fibrillation  Disposition: Home  Discharge Condition: Stable  Discharge Exam:  General: NAD, non-toxic, sitting up in chair, appears uncomfortable, actively attempting to urinate into urinal without success Cardiovascular: RRR, normal S1, S2. 2+ RP bilaterally. No BLEE  Respiratory: CTAB. No IWOB.  Abdomen: + BS. NT, ND, soft to palpation.  Extremities: Warm and well perfused. Moving spontaneously.  Brief Hospital Course:  Randy Fuller a 85 y.o.malepresenting with 4 days abdominal pain. PMH is significant forAtrial Fibrillation on Eliquis, Diverticulosis, HTN, HLD.  His hospital course is outlined below.  SBO Patient was admitted for SBO confirmed on CT Abd/Pelvis.  General Surgery was consulted and placed NG tube on 5/3. This was clamped on 5/4 and diet was started on 5/5.  Patient tolerated diet well and was advanced to full diet prior to discharge.  Surgery did not recommend any surgical intervention.  A-Fib Patient took eliquis at home. While he was NPO he was started on dabigatran drip due to heparin allergy.  When his diet resumed he was restarted on eliquis.   Urinary retention Patient developed urinary retention just prior to discharge.  On the evening of 08/03/2018, he was noted to be retaining with bladder scan and was in and out cath'ed twice.  It was thought to be due to stopping his medications secondary to his n.p.o. status.  Patient was restarted on Flomax and Proscar and  Foley was placed.  Flomax was increased to 0.8 mg from 0.4 mg.  Patient was otherwise medically stable and was agreeable to going home with Foley and would follow-up with his primary care provider to remove the Foley within 3 to 5 days of discharge.  Issues for Follow Up:  1. SBO - assess patient for recurrence of obstruction symptoms.   2. Urinary retention -can decrease Flomax back to 0.4 mg.  Voiding trial.  Remove Foley catheter  Significant Procedures: None  Significant Labs and Imaging:  Recent Labs  Lab 08/01/18 0217 08/02/18 0303 08/03/18 0207  WBC 5.1 7.5 10.2  HGB 14.3 14.2 14.6  HCT 44.4 43.1 45.0  PLT 135* 142* 145*   Recent Labs  Lab 07/30/18 1009 07/31/18 0129 08/01/18 0217 08/02/18 0303 08/03/18 0207 08/04/18 1230  NA 139 142 148* 146* 142 139  K 5.2* 3.4* 3.6 3.6 3.6 2.9*  CL 100 101 105 105 104 98  CO2 26 29 29 26 22 30   GLUCOSE 138* 104* 74 80 56* 139*  BUN 28* 23 22 19 15 9   CREATININE 1.38* 1.27* 1.36* 1.24 1.20 1.08  CALCIUM 10.0 9.3 9.4 9.2 8.7* 8.9  MG  --  2.1  --   --   --   --   ALKPHOS 85  --   --   --   --   --   AST 54*  --   --   --   --   --   ALT 16  --   --   --   --   --   ALBUMIN 4.1  --   --   --   --   --  Results/Tests Pending at Time of Discharge: none   Discharge Medications:  Allergies as of 08/05/2018      Reactions   Pork-derived Products Swelling      Medication List    TAKE these medications   allopurinol 300 MG tablet Commonly known as:  ZYLOPRIM Take 450 mg by mouth daily.   amLODipine 2.5 MG tablet Commonly known as:  NORVASC Take 1 tablet (2.5 mg total) by mouth daily.   CENTRUM SILVER PO Take 1 tablet by mouth daily.   Cholecalciferol 25 MCG (1000 UT) capsule Take 1,000 Units by mouth daily.   dorzolamide-timolol 22.3-6.8 MG/ML ophthalmic solution Commonly known as:  COSOPT Place 1 drop into both eyes 2 (two) times daily.   Eliquis 5 MG Tabs tablet Generic drug:  apixaban TAKE 1 TABLET TWICE A  DAY What changed:  how much to take   finasteride 5 MG tablet Commonly known as:  PROSCAR Take 5 mg by mouth daily.   latanoprost 0.005 % ophthalmic solution Commonly known as:  XALATAN Place 1 drop into both eyes daily.   losartan 50 MG tablet Commonly known as:  COZAAR Take 1 tablet (50 mg total) by mouth daily.   rosuvastatin 40 MG tablet Commonly known as:  Crestor Take 1 tablet (40 mg total) by mouth daily.   sildenafil 100 MG tablet Commonly known as:  VIAGRA Take 100 mg by mouth as needed.   tamsulosin 0.4 MG Caps capsule Commonly known as:  FLOMAX Take 2 capsules (0.8 mg total) by mouth daily. Start taking on:  Aug 06, 2018 What changed:    how much to take  when to take this   triamcinolone ointment 0.1 % Commonly known as:  KENALOG Apply 1 application topically 2 (two) times daily as needed (DRY SKIN).            Durable Medical Equipment  (From admission, onward)         Start     Ordered   08/05/18 1540  For home use only DME 3 n 1  Once     08/05/18 1539   08/01/18 0844  For home use only DME Walker rolling  Once    Comments:  5" wheels  Question:  Patient needs a walker to treat with the following condition  Answer:  Weakness   08/01/18 0843          Discharge Instructions: Please refer to Patient Instructions section of EMR for full details.  Patient was counseled important signs and symptoms that should prompt return to medical care, changes in medications, dietary instructions, activity restrictions, and follow up appointments.   Follow-Up Appointments: Follow-up Information    [facility] Follow up.   Specialty:  Home Health Services Why:  home health PT  Contact information: [address] [place] [address] [phone]           Wilber Oliphant, MD 08/05/2018, 9:05 PM PGY-1, [place]

## 2018-08-01 NOTE — Progress Notes (Signed)
Dr Sandi Carne was texted a message regarding an elevated BP reading that was obtained recently.

## 2018-08-01 NOTE — Progress Notes (Addendum)
ANTICOAGULATION CONSULT NOTE  Pharmacy Consult: Argatroban Indication: atrial fibrillation  Patient Measurements: Height: 5\' 11"  (180.3 cm) Weight: 151 lb (68.5 kg) IBW/kg (Calculated) : 75.3  Vital Signs: Temp: 97.9 F (36.6 C) (05/02 0700) Temp Source: Oral (05/02 0509) BP: 157/72 (05/02 0900) Pulse Rate: 101 (05/02 0900)  Labs: Recent Labs    07/30/18 1009  07/31/18 0129  07/31/18 2242 08/01/18 0217 08/01/18 0708  HGB 16.1  --  13.6  --   --  14.3  --   HCT 49.0  --  40.0  --   --  44.4  --   PLT 140*  --  128*  --   --  135*  --   APTT  --    < > 58*   < > 50* 49* 50*  CREATININE 1.38*  --  1.27*  --   --  1.36*  --    < > = values in this interval not displayed.   Assessment: 85 yo male on Eliquis PTA for AFib. He has not had a dose in a couple days. Originally planned to heparin infusion; however, the patient has an allergy listed to heparin. I discussed this with him, he would prefer not to have a medication that contains pork products.   Today, 08/01/2018 0546 aPTT = 50 sec on 1.4 mcg/kg/min, aPTT resulted at low end of therapeutic range despite serial rate increases, will increase rate to 1.7 mcg/kg/min, next aPTT 1400 - Difficulty obtaining 1400 aPTT, sample obtained by 3rd person to draw. 1456 aPTT 52 sec on 1.7 mcg/kg/min  Goal of Therapy:  APTT 50-85 sec Monitor platelets by anticoagulation protocol: Yes   Plan:  Continue argatroban at 1.7 mcg/kg/min (7 ml/hr) Daily aPTT   Minda Ditto , PharmD Clinical Pharmacist Please check AMION for all [place] numbers 08/01/2018 9:04 AM

## 2018-08-01 NOTE — Progress Notes (Addendum)
Family Medicine Teaching Service Daily Progress Note Intern Pager: [phone]  Patient name: Randy Fuller Medical record number: [id number] Date of birth: 1933/08/16 Age: 85 y.o. Gender: male  Primary Care Provider: Derinda Late, MD Consultants: General Surgery Code Status: Full   Pt Overview and Major Events to Date:  4/30 Admitted to [facility], NG tube placed  Assessment and Plan: Randy Fuller is a 85 y.o. male presenting with 4 days abdominal pain. PMH is significant for Atrial Fibrillation on Eliquis, Diverticulosis, HTN, HLD,   Small Bowel Obstruction NG tube 1.1L yesterday, pt reports passing gas but no BM.  PRN morphine x1 in last 24 hrs.  AXR this AM with unchanged SBO.  Pain well-controlled per patient.   - cardiac monitoring - OOB with assistance - General Surgery consulted, appreciate recs - SBO protocol: NG tube - mIVF 1/2NS @ 100 cc/hr - IV tylenol sch q6h - IV morphine 1mg  q2h prn breakthrough pain, can increase to 2mg  q2h prn if required  - IV zofran 4mg  q6h prn nausea - NPO - Argatroban per pharmacy for A Fib given NPO, will need to stop 6 hrs prior to procedure if occurs (patient refuses pork products) - PT/OT: HHPT and rolling walker, OT 24hr supervision, orders in  Atrial Fibrillation on Eliquis and Bradycardia Recently seen by Cardiologist, Dr. Gwenlyn Found, for fatigue and bradycardia to 30s-40s.  He was referred to EP, but has not yet been seen by them.  Last Echo 09/2017 with EF 55-60%, RA and LA severly dilated, mod increased Pulm Artery Pressure, cardiologist notes suggestive of possible amyloid, but patient was asymptomatic and decided to not work this up.  HR this AM 93. - hold Eliquis while NPO - argatroban per pharmacy for A Fib while NPO and pending surgery - consider EP eval while inpatient if bradycardic  - cardiac telemetry  HTN On norvasc 2.5mg  QD and losartan 50mg  QD at home.  Will need to hold home oral BP meds given NPO with SBO.  BP this  AM 178/101 at 0500. - repeat BP - hold home meds while NPO - can use hydral if HTN urgency   HLD On Crestor 40mg  QD at home. - hold crestor while NPO  Gout On Allopurinol 450mg  QD at home. - hold allopurinol while NPO  Glaucoma Patient uses Cosopt and Xalatan at home.   - cont home gtts  BPH Patient reports some chronic difficulty with urination.  On finasteride and tamsulosin at home. UOP 300cc with 1 unmeasured. - hold home meds while NPO - can in and out cath if needed for urinary retention  CKD 3A Cr 1.36 this AM. BL 1.2-13 - avoid nephrotoxic agents  Mild Hypokalemia: resolved H 5.2>3.4>3.6.  Mag WNL. - f/u BMP  Hypernatremia  Na 148 this AM.  Has been on NS 100cc/hr. - f/u BMP in AM - change to 100cc/hr 1/2NS  FEN/GI: NPO PPx: Argatroban   Disposition: continued inpatient stay, HHPT, OT: 24 hr supervision, orders in  Subjective:  Patient reports that his abdominal pain is well-controlled on current regimen.  He has no other complaints.    Objective: Temp:  [97.9 F (36.6 C)-98.4 F (36.9 C)] 97.9 F (36.6 C) (05/02 0700) Pulse Rate:  [82-90] 89 (05/02 0509) Resp:  [17] 17 (05/01 1452) BP: (141-188)/(98-101) 178/101 (05/02 0509) SpO2:  [93 %-99 %] 99 % (05/02 0509)  Physical Exam: General: 85 y.o. male in NAD, sitting up in chair HEENT: NG tube in place Cardio: RRR no m/r/g  Lungs: CTAB, no wheezing, no rhonchi, no crackles Abdomen: distended abdomen with decreased bowel sounds and increased tympany to percussion Skin: warm and dry Extremities: No edema    Laboratory: Recent Labs  Lab 07/30/18 1009 07/31/18 0129 08/01/18 0217  WBC 4.4 2.8* 5.1  HGB 16.1 13.6 14.3  HCT 49.0 40.0 44.4  PLT 140* 128* 135*   Recent Labs  Lab 07/30/18 1009 07/31/18 0129 08/01/18 0217  NA 139 142 148*  K 5.2* 3.4* 3.6  CL 100 101 105  CO2 26 29 29   BUN 28* 23 22  CREATININE 1.38* 1.27* 1.36*  CALCIUM 10.0 9.3 9.4  PROT 7.7  --   --   BILITOT  2.1*  --   --   ALKPHOS 85  --   --   ALT 16  --   --   AST 54*  --   --   GLUCOSE 138* 104* 74    Imaging/Diagnostic Tests: No results found.  Meccariello, Bernita Raisin, DO 08/01/2018, 8:44 AM PGY-1, [place] Intern pager: [phone], text pages welcome

## 2018-08-01 NOTE — Progress Notes (Addendum)
Physical Therapy Treatment Patient Details Name: Randy Fuller MRN: [id number] DOB: 02-05-1934 Today's Date: 08/01/2018    History of Present Illness Pt is an 85 y.o. male admitted 07/30/18 with abdominal pain and distension. CT showed high grade SBO. Plan for conservative medical management; NGT placed. PMH includes afib on Eliquis, HTN, L inguinal hernia repair (2014).    PT Comments    Pt making steady progress with functional mobility. He tolerated gait training in hallway without use of an AD; however, with modest instability and several LOB requiring frequent min A. Education provided re: OOB to chair x1 hour (at this time) and ambulation with nursing staff at least twice more today. Pt agreeable. Pt would continue to benefit from skilled physical therapy services at this time while admitted and after d/c to address the below listed limitations in order to improve overall safety and independence with functional mobility.    Follow Up Recommendations  Home health PT;Supervision for mobility/OOB     Equipment Recommendations  Rolling walker with 5" wheels;Other (comment)(pending progress)    Recommendations for Other Services       Precautions / Restrictions Precautions Precautions: Fall;Other (comment) Precaution Comments: NGT Restrictions Weight Bearing Restrictions: No    Mobility  Bed Mobility Overal bed mobility: Modified Independent                Transfers Overall transfer level: Needs assistance Equipment used: None Transfers: Sit to/from Stand Sit to Stand: Min guard         General transfer comment: min guard for safety  Ambulation/Gait Ambulation/Gait assistance: Min guard;Min assist Gait Distance (Feet): 100 Feet Assistive device: None Gait Pattern/deviations: Step-through pattern;Decreased stride length;Drifts right/left;Staggering right;Staggering left;Narrow base of support Gait velocity: decreased   General Gait Details: pt with LOB  laterally x4 requiring min A to recover. Pt also with some intermittent scissoring noted. Modest instability requiring constant close CGA and frequent min A.   Stairs             Wheelchair Mobility    Modified Rankin (Stroke Patients Only)       Balance Overall balance assessment: Needs assistance Sitting-balance support: Feet supported Sitting balance-Leahy Scale: Good     Standing balance support: During functional activity;No upper extremity supported Standing balance-Leahy Scale: Fair Standing balance comment: static is fair with CGA                            Cognition Arousal/Alertness: Awake/alert Behavior During Therapy: WFL for tasks assessed/performed Overall Cognitive Status: Within Functional Limits for tasks assessed                                        Exercises      General Comments        Pertinent Vitals/Pain Pain Assessment: Faces Faces Pain Scale: Hurts a little bit Pain Location: Abdomen Pain Descriptors / Indicators: Discomfort;Guarding Pain Intervention(s): Monitored during session;Repositioned    Home Living                      Prior Function            PT Goals (current goals can now be found in the care plan section) Acute Rehab PT Goals PT Goal Formulation: With patient Time For Goal Achievement: 08/14/18 Potential to Achieve Goals: Good Progress towards PT  goals: Progressing toward goals    Frequency    Min 3X/week      PT Plan Current plan remains appropriate    Co-evaluation              AM-PAC PT "6 Clicks" Mobility   Outcome Measure  Help needed turning from your back to your side while in a flat bed without using bedrails?: None Help needed moving from lying on your back to sitting on the side of a flat bed without using bedrails?: None Help needed moving to and from a bed to a chair (including a wheelchair)?: A Little Help needed standing up from a chair  using your arms (e.g., wheelchair or bedside chair)?: None Help needed to walk in hospital room?: A Little Help needed climbing 3-5 steps with a railing? : A Little 6 Click Score: 21    End of Session Equipment Utilized During Treatment: Gait belt Activity Tolerance: Patient tolerated treatment well Patient left: in chair;with call bell/phone within reach Nurse Communication: Mobility status PT Visit Diagnosis: Other abnormalities of gait and mobility (R26.89);Muscle weakness (generalized) (M62.81)     Time: 3825-0539 PT Time Calculation (min) (ACUTE ONLY): 20 min  Charges:  $Gait Training: 8-22 mins                     Sherie Don, [place], DPT  Acute Rehabilitation Services Pager [phone] Office [place] 08/01/2018, 9:07 AM

## 2018-08-01 NOTE — Progress Notes (Signed)
Notified Family Medicine of manual blood pressure 158/80. Collected by RN.

## 2018-08-01 NOTE — Progress Notes (Signed)
Paged Family Medicine regarding blood pressure 179/98.

## 2018-08-01 NOTE — Progress Notes (Signed)
Family Medicine Teaching Service Daily Progress Note Intern Pager: [phone]  Patient name: Oskar Cretella Medical record number: [id number] Date of birth: 1934/01/11 Age: 85 y.o. Gender: male  Primary Care Provider: Derinda Late, MD Consultants: General Surgery Code Status: Full   Pt Overview and Major Events to Date:  4/30 Admitted to [facility], NG tube placed  Assessment and Plan: Labrandon Knoch is a 85 y.o. male presenting with 4 days abdominal pain. PMH is significant for Atrial Fibrillation on Eliquis, Diverticulosis, HTN, HLD,   Small Bowel Obstruction Abdomen is soft and mildly tender diffusely, nondistended. +BS appreciated. NG tube 775 output yesterday, pt reports +flatus, +small BM this morning.  PRN morphine has not been used since yesterday at 6AM. AXR this AM is pending.  Pain well-controlled per patient.   - OOB with assistance - General Surgery consulted, appreciate recs - SBO protocol: NG tube - mIVF 1/2NS @ 100 cc/hr - IV tylenol sch q6h x24 hours - will need to be reordered daily - IV morphine 1mg  q2h prn breakthrough pain, can increase to 2mg  q2h prn if required  - IV zofran 4mg  q6h prn nausea - NPO - Argatroban per pharmacy for A Fib given NPO, will need to stop 6 hrs prior to procedure if occurs (patient refuses pork products) - PT/OT: HHPT and rolling walker, OT 24hr supervision, orders in  Atrial Fibrillation on Eliquis and Bradycardia Recently seen by Cardiologist, Dr. Gwenlyn Found, for fatigue and bradycardia to 30s-40s.  He was referred to EP, but has not yet been seen by them.  Last Echo 09/2017 with EF 55-60%, RA and LA severly dilated, mod increased Pulm Artery Pressure, cardiologist notes suggestive of possible amyloid, but patient was asymptomatic and decided to not work this up.  HR this AM 77 - hold Eliquis while NPO - argatroban per pharmacy for A Fib while NPO and pending surgery - consider EP eval while inpatient if bradycardic   - cardiac  telemetry   HTN On norvasc 2.5mg  QD and losartan 50mg  QD at home.  Will need to hold home oral BP meds given NPO with SBO.  BP this AM 158/82 at 0500. - if elevated, recheck BP with manual cuff - hold home meds while NPO - can use hydral if HTN urgency   HLD On Crestor 40mg  QD at home. - hold crestor while NPO  Gout On Allopurinol 450mg  QD at home. - hold allopurinol while NPO  Glaucoma Patient uses Cosopt and Xalatan at home.   - cont home gtts  BPH Patient reports some chronic difficulty with urination.  On finasteride and tamsulosin at home. UOP 400cc. - hold home meds while NPO - can bladder scan/ in-and-out cath if needed for urinary retention  CKD 3A Cr 1.24 this AM. BL 1.2-13 - avoid nephrotoxic agents  Mild Hypokalemia: resolved H 5.2>>3.6.  Mag WNL. - f/u BMP  Hypernatremia  Na 146 from 148 this AM.  Has been on 1/2NS 100cc/hr. - f/u BMP in AM - cont 1/2NS  FEN/GI: NPO PPx: Argatroban   Disposition: continued inpatient stay, HHPT, OT: 24 hr supervision, orders in  Subjective:  Patient rests comfortably, wakes easily. Endorses flatus and one small BM around 4:30AM. Feels abdomen has improved since yesterday.   Objective: Temp:  [97.4 F (36.3 C)-98.4 F (36.9 C)] 97.4 F (36.3 C) (05/02 1714) Pulse Rate:  [60-101] 62 (05/02 1754) Resp:  [18] 18 (05/02 1754) BP: (142-198)/(72-101) 142/88 (05/02 1754) SpO2:  [93 %-99 %] 93 % (05/02  1714)  Physical Exam: General: 85 y.o. male, nontoxic, NAD HEENT: NG tube in place draining dark fluid Cardio: RRR no m/r/g Lungs: CTAB, no W/R/R Abdomen: +mildly distended abdomen is mildly tender to palpation, +normoactive BS, no rebound tenderness or guarding Skin: warm and dry, no rashes appreciated Extremities: no LE edema, no gross deformity   Laboratory: Recent Labs  Lab 07/30/18 1009 07/31/18 0129 08/01/18 0217  WBC 4.4 2.8* 5.1  HGB 16.1 13.6 14.3  HCT 49.0 40.0 44.4  PLT 140* 128* 135*    Recent Labs  Lab 07/30/18 1009 07/31/18 0129 08/01/18 0217  NA 139 142 148*  K 5.2* 3.4* 3.6  CL 100 101 105  CO2 26 29 29   BUN 28* 23 22  CREATININE 1.38* 1.27* 1.36*  CALCIUM 10.0 9.3 9.4  PROT 7.7  --   --   BILITOT 2.1*  --   --   ALKPHOS 85  --   --   ALT 16  --   --   AST 54*  --   --   GLUCOSE 138* 104* 74    Imaging/Diagnostic Tests: Dg Abd Portable 1v  08/01/2018 FINDINGS: Partially visualized nasogastric tube in the stomach. Diffusely dilated small bowel is unchanged. No acute osseous abnormality.  IMPRESSION: Unchanged small bowel obstruction.   Everrett Coombe, MD 08/01/2018, 7:35 PM PGY-3, [place] Intern pager: [phone], text pages welcome

## 2018-08-02 ENCOUNTER — Inpatient Hospital Stay (HOSPITAL_COMMUNITY): Payer: Medicare HMO

## 2018-08-02 LAB — BASIC METABOLIC PANEL
Anion gap: 15 (ref 5–15)
BUN: 19 mg/dL (ref 8–23)
CO2: 26 mmol/L (ref 22–32)
Calcium: 9.2 mg/dL (ref 8.9–10.3)
Chloride: 105 mmol/L (ref 98–111)
Creatinine, Ser: 1.24 mg/dL (ref 0.61–1.24)
GFR calc Af Amer: >60 mL/min (ref >60)
GFR calc non Af Amer: 53 mL/min — ABNORMAL LOW (ref >60)
Glucose, Bld: 80 mg/dL (ref 70–99)
Potassium: 3.6 mmol/L (ref 3.5–5.1)
Sodium: 146 mmol/L — ABNORMAL HIGH (ref 135–145)

## 2018-08-02 LAB — APTT
aPTT: 49 seconds — ABNORMAL HIGH (ref 24–36)
aPTT: 53 seconds — ABNORMAL HIGH (ref 24–36)
aPTT: 52 seconds — ABNORMAL HIGH (ref 24–36)

## 2018-08-02 LAB — CBC
HCT: 43.1 % (ref 39.0–52.0)
Hemoglobin: 14.2 g/dL (ref 13.0–17.0)
MCH: 30.4 pg (ref 26.0–34.0)
MCHC: 32.9 g/dL (ref 30.0–36.0)
MCV: 92.3 fL (ref 80.0–100.0)
Platelets: 142 10*3/uL — ABNORMAL LOW (ref 150–400)
RBC: 4.67 MIL/uL (ref 4.22–5.81)
RDW: 15.2 % (ref 11.5–15.5)
WBC: 7.5 10*3/uL (ref 4.0–10.5)
nRBC: 0 % (ref 0.0–0.2)

## 2018-08-02 MED ORDER — PANTOPRAZOLE SODIUM 40 MG IV SOLR
40.0000 mg | INTRAVENOUS | Status: DC
  Administered 2018-08-02 – 2018-08-05 (×4): 40 mg via INTRAVENOUS
  Filled 2018-08-02 (×4): qty 40

## 2018-08-02 NOTE — Progress Notes (Signed)
Dr Sandi Carne is aware of the blood tinged drainage note from NG.  No new orders received and will continue to monitor closely.

## 2018-08-02 NOTE — Progress Notes (Signed)
Dr Sandi Carne was sent a text message regarding NG drainage with blood tinged secretions.  The drainage is not grossly bloody but thin bloody drainage noted in the tubing.

## 2018-08-02 NOTE — Progress Notes (Addendum)
ANTICOAGULATION CONSULT NOTE  Pharmacy Consult: Argatroban Indication: atrial fibrillation  Patient Measurements: Height: 5\' 11"  (180.3 cm) Weight: 151 lb (68.5 kg) IBW/kg (Calculated) : 75.3  Vital Signs: Temp: 98.3 F (36.8 C) (05/03 0342) Temp Source: Oral (05/03 0342) BP: 158/82 (05/03 0439) Pulse Rate: 77 (05/03 0342)  Labs: Recent Labs    07/31/18 0129  08/01/18 0217  08/01/18 1456 08/02/18 0303 08/02/18 0755  HGB 13.6  --  14.3  --   --  14.2  --   HCT 40.0  --  44.4  --   --  43.1  --   PLT 128*  --  135*  --   --  142*  --   APTT 58*   < > 49*   < > 52* 49* 53*  CREATININE 1.27*  --  1.36*  --   --  1.24  --    < > = values in this interval not displayed.   Assessment: 85 yo male on Eliquis PTA for AFib. He has not had a dose in a couple days. Originally planned to heparin infusion; however, the patient has an allergy listed to heparin. I discussed this with him, he would prefer not to have a medication that contains pork products.   Today, 08/02/2018 Difficulty obtaining afternoon aPTT on 5/2, sample obtained by 3rd person to draw. 0303 aPTT = 49 sec, sl below goal range, rate increased to 1.8 mcg/kg/min 0755 aPTT = 53 sec 1456 aPTT = 52 sec, and RN noted some bloody drainage from NG, MD aware, noted that NG changed today, likely from manipulation, added Protonix  Goal of Therapy:  APTT 50-85 sec Monitor platelets by anticoagulation protocol: Yes   Plan:  Continue argatroban at 1.8 mcg/kg/min (7 ml/hr) Daily aPTT CBC in am   Minda Ditto , PharmD Clinical Pharmacist Please check AMION for all [place] numbers 08/02/2018 8:37 AM

## 2018-08-02 NOTE — Progress Notes (Signed)
Occupational Therapy Treatment Patient Details Name: Randy Fuller MRN: [id number] DOB: 1933/05/27 Today's Date: 08/02/2018    History of present illness Pt is an 85 y.o. male admitted 07/30/18 with abdominal pain and distension. CT showed high grade SBO. Plan for conservative medical management; NGT placed. PMH includes afib on Eliquis, HTN, L inguinal hernia repair (2014).   OT comments  Pt. Seen for skilled OT.  Focus of session energy conservation strategies during ADL completion.  Pt. Receptive to instruction and examples.  Able to return demo of LB dressing utilizing the strategies.  Reports his niece assists with meals at home for him and his wife.    Follow Up Recommendations  No OT follow up;Supervision/Assistance - 24 hour    Equipment Recommendations  None recommended by OT    Recommendations for Other Services PT consult    Precautions / Restrictions Precautions Precautions: Fall;Other (comment) Precaution Comments: NGT Restrictions Weight Bearing Restrictions: No       Mobility Bed Mobility                  Transfers                      Balance                                           ADL either performed or assessed with clinical judgement   ADL Overall ADL's : Needs assistance/impaired       Grooming Details (indicate cue type and reason): reviewed incorporating energy conservation strategy of sitting during oral care and shaving, pt. verbalized understanding and agreed             Lower Body Dressing: Sitting/lateral leans;Sit to/from stand;Set up Lower Body Dressing Details (indicate cue type and reason): had pt. return demo of donning/doffing socks by crossing each leg over knee. discussed this is also preferred method for donning undergarments and pants.  reviewed standing to don is very unsafe as it is a high fall risk.  pt. states he did used to get dressed in standing and noticed he was losing his balance  a lot and stumbling.  agreed to don and pre-load (underwear and pants) prior to standing for safety and energy conservation benefits   Toilet Transfer Details (indicate cue type and reason): reviewed use of urinal at night seated eob or supine if needed for safety and also energy conservation.  pt. states he has to urinate approx. 4x a night and had noticed light headed and balance deficits while trying to get to the b.room.  pleased with suggestion for urinal use eob seated at night.             General ADL Comments: pt. provided exampled of energy conservation strategies for dressing, grooming, and toileting tasks.  recepetive to the education and able to provide examples why modifications are necessary.  reports his niece is very active in assisting him and his wife as needed and provides their meals. he reports he has no trouble asking for assistance.  reviewed having her gather necessary items and placing in central location for him night before ie: all clothing and hygiene items to prevent him from having to go and gather from various locations.  he agreed.      Vision       Perception     Praxis  Cognition Arousal/Alertness: Awake/alert Behavior During Therapy: WFL for tasks assessed/performed Overall Cognitive Status: Within Functional Limits for tasks assessed                                          Exercises     Shoulder Instructions       General Comments  Loves smoothies. Says "I am ready for a smoothie" states strawberry is his favorite fruit.     Pertinent Vitals/ Pain       Pain Assessment: No/denies pain  Home Living                                          Prior Functioning/Environment              Frequency  Min 2X/week        Progress Toward Goals  OT Goals(current goals can now be found in the care plan section)  Progress towards OT goals: Progressing toward goals     Plan      Co-evaluation                  AM-PAC OT "6 Clicks" Daily Activity     Outcome Measure   Help from another person eating meals?: Total Help from another person taking care of personal grooming?: A Little Help from another person toileting, which includes using toliet, bedpan, or urinal?: A Little Help from another person bathing (including washing, rinsing, drying)?: A Little Help from another person to put on and taking off regular upper body clothing?: A Little Help from another person to put on and taking off regular lower body clothing?: A Little 6 Click Score: 16    End of Session    OT Visit Diagnosis: Unsteadiness on feet (R26.81);Muscle weakness (generalized) (M62.81)   Activity Tolerance Patient tolerated treatment well   Patient Left in chair;with call bell/phone within reach   Nurse Communication          Time: 5638-9373 OT Time Calculation (min): 13 min  Charges: OT General Charges $OT Visit: 1 Visit OT Treatments $Self Care/Home Management : 8-22 mins   Janice Coffin, COTA/L 08/02/2018, 11:45 AM

## 2018-08-02 NOTE — Progress Notes (Signed)
ANTICOAGULATION CONSULT NOTE  Pharmacy Consult: Argatroban Indication: atrial fibrillation  Patient Measurements: Height: 5\' 11"  (180.3 cm) Weight: 151 lb (68.5 kg) IBW/kg (Calculated) : 75.3  Vital Signs: Temp: 98.3 F (36.8 C) (05/03 0342) Temp Source: Oral (05/03 0342) BP: 177/105 (05/03 0342) Pulse Rate: 77 (05/03 0342)  Labs: Recent Labs    07/31/18 0129  08/01/18 0217 08/01/18 0708 08/01/18 1456 08/02/18 0303  HGB 13.6  --  14.3  --   --  14.2  HCT 40.0  --  44.4  --   --  43.1  PLT 128*  --  135*  --   --  142*  APTT 58*   < > 49* 50* 52* 49*  CREATININE 1.27*  --  1.36*  --   --  1.24   < > = values in this interval not displayed.   Assessment: 85 yo male on Eliquis PTA for AFib. He has not had a dose in a couple days. Originally planned to heparin infusion; however, the patient has an allergy listed to heparin. I discussed this with him, he would prefer not to have a medication that contains pork products.   APTT 49 seconds with AM labs, H/H wnl, plts stable.     Goal of Therapy:  APTT 50-85 sec Monitor platelets by anticoagulation protocol: Yes   Plan:  Increase argatroban to 1.8 mcg/kg/min F/u aPTT 0700  Bertis Ruddy, PharmD Clinical Pharmacist Please check AMION for all [place] numbers 08/02/2018 4:35 AM

## 2018-08-02 NOTE — Progress Notes (Signed)
Patient ID: Randy Fuller, male   DOB: 1934/03/11, 85 y.o.   MRN: [id number] [facility] Surgery Progress Note:   * No surgery found *  Subjective: Mental status is clear.  Very pleasant and appropriate;  Passed flatus and small BM Objective: Vital signs in last 24 hours: Temp:  [97.4 F (36.3 C)-98.9 F (37.2 C)] 98.3 F (36.8 C) (05/03 0342) Pulse Rate:  [60-77] 77 (05/03 0342) Resp:  [14-18] 14 (05/03 0342) BP: (142-198)/(80-105) 158/82 (05/03 0439) SpO2:  [92 %-94 %] 92 % (05/03 0342)  Intake/Output from previous day: 05/02 0701 - 05/03 0700 In: 1725.9 [P.O.:60; I.V.:1615.9; IV Piggyback:50] Out: 1475 [Urine:400; Emesis/NG QIONGE:9528] Intake/Output this shift: Total I/O In: 360 [P.O.:360] Out: -   Physical Exam: Work of breathing is not labored.  Abdomen is softer and nontender  Lab Results:  Results for orders placed or performed during the hospital encounter of 07/30/18 (from the past 48 hour(s))  APTT     Status: Abnormal   Collection Time: 07/31/18  1:41 PM  Result Value Ref Range   aPTT 48 (H) 24 - 36 seconds    Comment:        IF BASELINE aPTT IS ELEVATED, SUGGEST PATIENT RISK ASSESSMENT BE USED TO DETERMINE APPROPRIATE ANTICOAGULANT THERAPY. Performed at [facility], [place] [address] [place], [place] [address]   APTT     Status: Abnormal   Collection Time: 07/31/18  6:03 PM  Result Value Ref Range   aPTT 51 (H) 24 - 36 seconds    Comment:        IF BASELINE aPTT IS ELEVATED, SUGGEST PATIENT RISK ASSESSMENT BE USED TO DETERMINE APPROPRIATE ANTICOAGULANT THERAPY. Performed at [facility], [place] [address] [place], [place] [address]   APTT     Status: Abnormal   Collection Time: 07/31/18 10:42 PM  Result Value Ref Range   aPTT 50 (H) 24 - 36 seconds    Comment:        IF BASELINE aPTT IS ELEVATED, SUGGEST PATIENT RISK ASSESSMENT BE USED TO DETERMINE APPROPRIATE ANTICOAGULANT THERAPY. Performed at [facility], [place]  [address] [place], [place] [address]   APTT     Status: Abnormal   Collection Time: 08/01/18  2:17 AM  Result Value Ref Range   aPTT 49 (H) 24 - 36 seconds    Comment:        IF BASELINE aPTT IS ELEVATED, SUGGEST PATIENT RISK ASSESSMENT BE USED TO DETERMINE APPROPRIATE ANTICOAGULANT THERAPY. Performed at [facility], [place] [address], [place], [place] [address]   Basic metabolic panel     Status: Abnormal   Collection Time: 08/01/18  2:17 AM  Result Value Ref Range   Sodium 148 (H) 135 - 145 mmol/L   Potassium 3.6 3.5 - 5.1 mmol/L   Chloride 105 98 - 111 mmol/L   CO2 29 22 - 32 mmol/L   Glucose, Bld 74 70 - 99 mg/dL   BUN 22 8 - 23 mg/dL   Creatinine, Ser 1.36 (H) 0.61 - 1.24 mg/dL   Calcium 9.4 8.9 - 10.3 mg/dL   GFR calc non Af Amer 48 (L) >60 mL/min   GFR calc Af Amer 55 (L) >60 mL/min   Anion gap 14 5 - 15    Comment: Performed at [facility] [address], [place], [place] [address]  CBC     Status: Abnormal   Collection Time: 08/01/18  2:17 AM  Result Value Ref Range   WBC 5.1 4.0 - 10.5 K/uL   RBC 4.81 4.22 - 5.81 MIL/uL   Hemoglobin 14.3 13.0 - 17.0 g/dL   HCT 44.4 39.0 - 52.0 %   MCV 92.3 80.0 - 100.0 fL   MCH 29.7 26.0 - 34.0 pg   MCHC 32.2 30.0 - 36.0 g/dL   RDW 15.1 11.5 - 15.5 %   Platelets 135 (L) 150 - 400 K/uL   nRBC 0.0 0.0 - 0.2 %    Comment: Performed at [facility] [address], [place], [place] [address]  APTT     Status: Abnormal   Collection Time: 08/01/18  7:08 AM  Result Value Ref Range   aPTT 50 (H) 24 - 36 seconds    Comment:        IF BASELINE aPTT IS ELEVATED, SUGGEST PATIENT RISK ASSESSMENT BE USED TO DETERMINE APPROPRIATE ANTICOAGULANT THERAPY. Performed at [facility], [place] [address], [place], [place] [address]   APTT     Status: Abnormal   Collection Time: 08/01/18  2:56 PM  Result Value Ref Range   aPTT 52 (H) 24 - 36 seconds    Comment:        IF BASELINE aPTT IS ELEVATED, SUGGEST PATIENT RISK  ASSESSMENT BE USED TO DETERMINE APPROPRIATE ANTICOAGULANT THERAPY. Performed at [facility], [place] [address], [place], [place] [address]   APTT     Status: Abnormal   Collection Time: 08/02/18  3:03 AM  Result Value Ref Range   aPTT 49 (H) 24 - 36 seconds    Comment:        IF BASELINE aPTT IS ELEVATED, SUGGEST PATIENT RISK ASSESSMENT BE USED TO DETERMINE APPROPRIATE ANTICOAGULANT THERAPY. Performed at [facility], [place] [address], [place], [place] [address]   CBC     Status: Abnormal   Collection Time: 08/02/18  3:03 AM  Result Value Ref Range   WBC 7.5 4.0 - 10.5 K/uL   RBC 4.67 4.22 - 5.81 MIL/uL   Hemoglobin 14.2 13.0 - 17.0 g/dL   HCT 43.1 39.0 - 52.0 %   MCV 92.3 80.0 - 100.0 fL   MCH 30.4 26.0 - 34.0 pg   MCHC 32.9 30.0 - 36.0 g/dL   RDW 15.2 11.5 - 15.5 %   Platelets 142 (L) 150 - 400 K/uL   nRBC 0.0 0.0 - 0.2 %    Comment: Performed at [facility], [place] [address], [place], [place] [address]  Basic metabolic panel     Status: Abnormal   Collection Time: 08/02/18  3:03 AM  Result Value Ref Range   Sodium 146 (H) 135 - 145 mmol/L   Potassium 3.6 3.5 - 5.1 mmol/L   Chloride 105 98 - 111 mmol/L   CO2 26 22 - 32 mmol/L   Glucose, Bld 80 70 - 99 mg/dL   BUN 19 8 - 23 mg/dL   Creatinine, Ser 1.24 0.61 - 1.24 mg/dL   Calcium 9.2 8.9 - 10.3 mg/dL   GFR calc non Af Amer 53 (L) >60 mL/min   GFR calc Af Amer >60 >60 mL/min   Anion gap 15 5 - 15    Comment: Performed at [facility], [place] [address] [place], [place] [address]  APTT     Status: Abnormal   Collection Time: 08/02/18  7:55 AM  Result Value Ref Range   aPTT 53 (H) 24 - 36 seconds  Comment:        IF BASELINE aPTT IS ELEVATED, SUGGEST PATIENT RISK ASSESSMENT BE USED TO DETERMINE APPROPRIATE ANTICOAGULANT THERAPY. Performed at [facility], [place] [address], [place], [place] [address]     Radiology/Results: Dg Abd Portable 1v  Result Date: 08/02/2018 CLINICAL DATA:  Small  bowel obstruction. EXAM: PORTABLE ABDOMEN - 1 VIEW COMPARISON:  Abdominal x-ray from yesterday. FINDINGS: Enteric tube in the stomach. Small bowel dilatation has improved with persistent mild dilatation of several proximal small bowel loops. Oral contrast is seen within the relatively decompressed colon. No acute osseous abnormality. IMPRESSION: 1. Improving small bowel obstruction. Electronically Signed   By: Titus Dubin M.D.   On: 08/02/2018 07:44   Dg Abd Portable 1v  Result Date: 08/01/2018 CLINICAL DATA:  Small bowel obstruction. EXAM: PORTABLE ABDOMEN - 1 VIEW COMPARISON:  Abdominal x-ray from yesterday. FINDINGS: Partially visualized nasogastric tube in the stomach. Diffusely dilated small bowel is unchanged. No acute osseous abnormality. IMPRESSION: Unchanged small bowel obstruction. Electronically Signed   By: Titus Dubin M.D.   On: 08/01/2018 08:48    Anti-infectives: Anti-infectives (From admission, onward)   None      Assessment/Plan: Problem List: Patient Active Problem List   Diagnosis Date Noted  . Chronic atrial fibrillation   . SBO (small bowel obstruction) ([place]) 07/30/2018  . Abnormal echocardiogram 05/20/2018  . Vitamin D deficiency   . Nocturia   . Internal hemorrhoids   . Impaired glucose tolerance   . Hyperlipidemia   . Hearing loss   . Gout   . Glaucoma   . GERD (gastroesophageal reflux disease)   . Fatty liver   . Esophagitis   . Enlarged prostate   . Elevated LFTs   . ED (erectile dysfunction)   . Dry skin   . Diverticulosis   . Colon polyps   . Cataract   . Bruises easily   . Bilateral pneumonia   . Benign prostate hyperplasia   . Arthritis   . Anemia   . Atrial fibrillation, chronic 10/09/2016  . Esophageal stricture   . Chronic anticoagulation 08/13/2012  . 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  Will leave NG tube for now but he is making progress and hopefully it can come out soon.    * No surgery found *    LOS: 3 days   Matt B. Hassell Done, MD, [facility] [phone] beeper [phone]  08/02/2018 9:17 AM

## 2018-08-03 ENCOUNTER — Inpatient Hospital Stay (HOSPITAL_COMMUNITY): Payer: Medicare HMO

## 2018-08-03 LAB — BASIC METABOLIC PANEL
Anion gap: 16 — ABNORMAL HIGH (ref 5–15)
BUN: 15 mg/dL (ref 8–23)
CO2: 22 mmol/L (ref 22–32)
Calcium: 8.7 mg/dL — ABNORMAL LOW (ref 8.9–10.3)
Chloride: 104 mmol/L (ref 98–111)
Creatinine, Ser: 1.2 mg/dL (ref 0.61–1.24)
GFR calc Af Amer: >60 mL/min (ref >60)
GFR calc non Af Amer: 56 mL/min — ABNORMAL LOW (ref >60)
Glucose, Bld: 56 mg/dL — ABNORMAL LOW (ref 70–99)
Potassium: 3.6 mmol/L (ref 3.5–5.1)
Sodium: 142 mmol/L (ref 135–145)

## 2018-08-03 LAB — CBC
HCT: 45 % (ref 39.0–52.0)
Hemoglobin: 14.6 g/dL (ref 13.0–17.0)
MCH: 29.8 pg (ref 26.0–34.0)
MCHC: 32.4 g/dL (ref 30.0–36.0)
MCV: 91.8 fL (ref 80.0–100.0)
Platelets: 145 10*3/uL — ABNORMAL LOW (ref 150–400)
RBC: 4.9 MIL/uL (ref 4.22–5.81)
RDW: 14.8 % (ref 11.5–15.5)
WBC: 10.2 10*3/uL (ref 4.0–10.5)
nRBC: 0 % (ref 0.0–0.2)

## 2018-08-03 LAB — APTT: aPTT: 53 seconds — ABNORMAL HIGH (ref 24–36)

## 2018-08-03 MED ORDER — DEXTROSE-NACL 5-0.45 % IV SOLN
INTRAVENOUS | Status: DC
  Administered 2018-08-03 – 2018-08-05 (×4): via INTRAVENOUS

## 2018-08-03 NOTE — Progress Notes (Signed)
ANTICOAGULATION CONSULT NOTE  Pharmacy Consult: Argatroban Indication: atrial fibrillation  Patient Measurements: Height: 5\' 11"  (180.3 cm) Weight: 151 lb (68.5 kg) IBW/kg (Calculated) : 75.3  Vital Signs: Temp: 98.7 F (37.1 C) (05/04 0400) Temp Source: Oral (05/04 0400) BP: 158/98 (05/04 0410) Pulse Rate: 91 (05/04 0400)  Labs: Recent Labs    08/01/18 0217  08/02/18 0303 08/02/18 0755 08/02/18 1456 08/03/18 0207  HGB 14.3  --  14.2  --   --  14.6  HCT 44.4  --  43.1  --   --  45.0  PLT 135*  --  142*  --   --  145*  APTT 49*   < > 49* 53* 52* 53*  CREATININE 1.36*  --  1.24  --   --  1.20   < > = values in this interval not displayed.   Assessment: 85 yo male on Eliquis PTA for AFib. He has not had a dose in a couple days. Originally planned to heparin infusion; however, the patient has an allergy listed to heparin. I discussed this with him, he would prefer not to have a medication that contains pork products.   AM aPTT remains therapeutic at 53 seconds, CBC wnl and no bleeding reported.    Goal of Therapy:  APTT 50-85 sec Monitor platelets by anticoagulation protocol: Yes   Plan:  Continue argatroban at 1.8 mcg/kg/min Daily aPTT, CBC, s/s bleeding  Bertis Ruddy, PharmD Clinical Pharmacist Please check AMION for all [place] numbers 08/03/2018 5:24 AM

## 2018-08-03 NOTE — Progress Notes (Signed)
Family Medicine Teaching Service Daily Progress Note Intern Pager: [phone]  Patient name: Randy Fuller Medical record number: [id number] Date of birth: December 19, 1933 Age: 85 y.o. Gender: male  Primary Care Provider: Derinda Late, MD Consultants: surgery Code Status: full  Pt Overview and Major Events to Date:    Assessment and Plan: Randy Fuller a 85 y.o.malepresenting with 4 days abdominal pain. PMH is significant forAtrial Fibrillation on Eliquis, Diverticulosis, HTN, HLD,  Small Bowel Obstruction - no abdominal pain, patient had bowel movement at 4am and has been passing gas.  NGT output not documented yesterday, he had 67ml in of bilious fluid with sanguinous fluid in the tube. Abdominal xray showed movement of contrast into he colon. consider starting diet soon, will wait for surgery recommendations.    - surgery consulted, appreciate recs - SBO protocol: NGT - mIVF 100cc/hr 1/2NS changed to D5 1/2NS IV tylenol q6h - reorder daily - IV morphine PRN 1mg  q2h.  IV zofran 4mg  q6h for nausea - NPO - argatroban per pharm - OOB w/ assistance  A fib on eliquis/Bradycardia -recently seen by Dr. Gwenlyn Found for fatigue and was bradycardic to the 30-40s.  Referred to EP, has not seen them.  Echo in 09/2017 showed EF 55-60%, RA and LA severely dilated, moderatly increased pulmonary artery pressure. HR has been in the 80-90s.   - holding eliquis while NPO, - argatroban per pharm.  Will need to stop 6 hrs prior to any procedure - consider EP eval while inpatient - cardiac tele  HTN - on norvasc 2.5mg  QD and losartan 50mg  QD at home.  Will need to hold home oral BP medds while NPO d/t SBO.  BP this AM was 158/98 - hold home meds - can use hydral if in hypertensive urgency  HLD - on crestor 40mg  qdaily at home.   - hold crestor while NPO  Gout - allopurinol 450mg  qdaily at home - holding allopurinol  Glaucoma - cosopt and xalatan at [facility] - continue home meds  BPH -  on finasteride and tamsulosin at home.   - holding home meds while NPO - can bladder scan/I and O cath if needed  CKD stage 3A - Cr 1.2 this am - am bmp.   Hypernatremia - 142 this am, was 146 previously.  On half normal saline - continue half normal saline  FEN/GI: NPO.   PPx: argatroban  Disposition: home  Subjective:  Patient states he is not having any abdominal pain.  He did have a bowel movement early this morning but did not look at it to see how much or what color.  He has been passing gas.  No other complaints.    Objective: Temp:  [97.6 F (36.4 C)-98.7 F (37.1 C)] 98.7 F (37.1 C) (05/04 0400) Pulse Rate:  [79-91] 91 (05/04 0400) BP: (158-208)/(80-119) 158/98 (05/04 0410) SpO2:  [96 %-99 %] 96 % (05/04 0400) Physical Exam: General: alert and oriented.  No acute distress.   Cardiovascular: irregularly irregular rhythm. Normal rate.  No murmurs.  2+ radial pulse bilaterally.   Respiratory: LCTAB.  No tachypnea, no wheezes or crackles.   Abdomen: soft, nontender.  Normal bowel sounds.     Laboratory: Recent Labs  Lab 08/01/18 0217 08/02/18 0303 08/03/18 0207  WBC 5.1 7.5 10.2  HGB 14.3 14.2 14.6  HCT 44.4 43.1 45.0  PLT 135* 142* 145*   Recent Labs  Lab 07/30/18 1009  08/01/18 0217 08/02/18 0303 08/03/18 0207  NA 139   < >  148* 146* 142  K 5.2*   < > 3.6 3.6 3.6  CL 100   < > 105 105 104  CO2 26   < > 29 26 22   BUN 28*   < > 22 19 15   CREATININE 1.38*   < > 1.36* 1.24 1.20  CALCIUM 10.0   < > 9.4 9.2 8.7*  PROT 7.7  --   --   --   --   BILITOT 2.1*  --   --   --   --   ALKPHOS 85  --   --   --   --   ALT 16  --   --   --   --   AST 54*  --   --   --   --   GLUCOSE 138*   < > 74 80 56*   < > = values in this interval not displayed.      Imaging/Diagnostic Tests: AXR: improving small bowel obstruction  Benay Pike, MD 08/03/2018, 6:37 AM PGY-1, [place] Intern pager: [phone], text pages welcome

## 2018-08-03 NOTE — Progress Notes (Signed)
Physical Therapy Treatment Patient Details Name: Randy Fuller MRN: [id number] DOB: Jul 13, 1933 Today's Date: 08/03/2018    History of Present Illness Pt is an 85 y.o. male admitted 07/30/18 with abdominal pain and distension. CT showed high grade SBO. Plan for conservative medical management; NGT placed. PMH includes afib on Eliquis, HTN, L inguinal hernia repair (2014).    PT Comments    Improved stability with rolling walker. Walker fit for the patient. He had no loss of balance with walker. He did fatigue towards the end of ambulation but he had also walked with mobility tech this morning.  He was advised to use the walker when he is walking in the halls. Current plan remains appropriate. Actue PT will continue to follow.   Follow Up Recommendations  Home health PT;Supervision for mobility/OOB     Equipment Recommendations  Rolling walker with 5" wheels;Other (comment)    Recommendations for Other Services       Precautions / Restrictions Precautions Precautions: Fall;Other (comment) Precaution Comments: NGT Restrictions Weight Bearing Restrictions: No    Mobility  Bed Mobility               General bed mobility comments: Pateitn found in a chair   Transfers Overall transfer level: Needs assistance Equipment used: Rolling walker (2 wheeled) Transfers: Sit to/from Stand Sit to Stand: Min guard         General transfer comment: improved stability when transfering with walker   Ambulation/Gait Ambulation/Gait assistance: Min guard Gait Distance (Feet): 150 Feet Assistive device: None;Rolling walker (2 wheeled) Gait Pattern/deviations: Step-through pattern;Decreased stride length;Drifts right/left;Narrow base of support     General Gait Details: decreased stagering; short stride length; no LOB with gait with RW   Stairs             Wheelchair Mobility    Modified Rankin (Stroke Patients Only)       Balance   Sitting-balance support: Feet  supported Sitting balance-Leahy Scale: Good       Standing balance-Leahy Scale: Fair Standing balance comment: static is fair with CGA                            Cognition Arousal/Alertness: Awake/alert Behavior During Therapy: WFL for tasks assessed/performed Overall Cognitive Status: Within Functional Limits for tasks assessed                                        Exercises      General Comments        Pertinent Vitals/Pain Pain Assessment: No/denies pain    Home Living                      Prior Function            PT Goals (current goals can now be found in the care plan section) Acute Rehab PT Goals PT Goal Formulation: With patient Time For Goal Achievement: 08/14/18 Potential to Achieve Goals: Good Progress towards PT goals: Progressing toward goals    Frequency    Min 3X/week      PT Plan Current plan remains appropriate    Co-evaluation              AM-PAC PT "6 Clicks" Mobility   Outcome Measure  Help needed turning from your back to your side while in a  flat bed without using bedrails?: None Help needed moving from lying on your back to sitting on the side of a flat bed without using bedrails?: None Help needed moving to and from a bed to a chair (including a wheelchair)?: None Help needed standing up from a chair using your arms (e.g., wheelchair or bedside chair)?: A Little Help needed to walk in hospital room?: A Little Help needed climbing 3-5 steps with a railing? : A Little 6 Click Score: 21    End of Session Equipment Utilized During Treatment: Gait belt Activity Tolerance: Patient tolerated treatment well Patient left: in chair;with call bell/phone within reach Nurse Communication: Mobility status PT Visit Diagnosis: Other abnormalities of gait and mobility (R26.89);Muscle weakness (generalized) (M62.81)     Time: 6160-7371 PT Time Calculation (min) (ACUTE ONLY): 15 min  Charges:   $Gait Training: 8-22 mins                       Carney Living PT DPT  08/03/2018, 12:59 PM

## 2018-08-03 NOTE — TOC Initial Note (Addendum)
Transition of Care [facility]) - Initial/Assessment Note    Patient Details  Name: Randy Fuller MRN: [id number] Date of Birth: May 05, 1933  Transition of Care [facility]) CM/SW Contact:    Marilu Favre, RN Phone Number: 08/03/2018, 10:56 AM  Clinical Narrative:                  Confirmed face sheet information with patient at bedside. Patient lives with wife and his niece helps them with meals. Patient agreeable to home health PT , interested in [facility], called Joen Laura she is checking insurance and will call back. Tiffany with [facility] has accepted HHPT referral   Patient will also need walker . Called Zack with [employer]  Expected Discharge Plan: [place] Barriers to Discharge: Continued Medical Work up   Patient Goals and CMS Choice Patient states their goals for this hospitalization and ongoing recovery are:: to go home  CMS Medicare.gov Compare Post Acute Care list provided to:: Patient Choice offered to / list presented to : Patient  Expected Discharge Plan and Services Expected Discharge Plan: [place]   Discharge Planning Services: CM Consult Post Acute Care Choice: Home Health, Durable Medical Equipment Living arrangements for the past 2 months: Single Family Home                 DME Arranged: Walker rolling DME Agency: [employer]       HH Arranged: PT HH Agency: [facility] (formerly [employer]) Date [place]: 08/03/18 Time [place]: 46 Representative spoke with at [place]: [place] Arrangements/Services Living arrangements for the past 2 months: [place] Lives with:: Spouse   Do you feel safe going back to the place where you live?: Yes      Need for Family Participation in Patient Care: Yes (Comment) Care giver support system in place?: Yes (comment)   Criminal Activity/Legal Involvement Pertinent to Current Situation/Hospitalization: No -  Comment as needed  Activities of Daily Living Home Assistive Devices/Equipment: Blood pressure cuff ADL Screening (condition at time of admission) Patient's cognitive ability adequate to safely complete daily activities?: Yes Is the patient deaf or have difficulty hearing?: No Does the patient have difficulty seeing, even when wearing glasses/contacts?: No Does the patient have difficulty concentrating, remembering, or making decisions?: No Patient able to express need for assistance with ADLs?: Yes Does the patient have difficulty dressing or bathing?: No Independently performs ADLs?: Yes (appropriate for developmental age) Does the patient have difficulty walking or climbing stairs?: No Weakness of Legs: None Weakness of Arms/Hands: None  Permission Sought/Granted                  Emotional Assessment Appearance:: Appears stated age Attitude/Demeanor/Rapport: Engaged Affect (typically observed): Accepting, Appropriate Orientation: : Oriented to Self, Oriented to Place, Oriented to  Time, Oriented to Situation   Psych Involvement: No (comment)  Admission diagnosis:  Small bowel obstruction ([place]) [K56.609] Patient Active Problem List   Diagnosis Date Noted  . Chronic atrial fibrillation   . SBO (small bowel obstruction) ([place]) 07/30/2018  . Abnormal echocardiogram 05/20/2018  . Vitamin D deficiency   . Nocturia   . Internal hemorrhoids   . Impaired glucose tolerance   . Hyperlipidemia   . Hearing loss   . Gout   . Glaucoma   . GERD (gastroesophageal reflux disease)   . Fatty liver   . Esophagitis   .  Enlarged prostate   . Elevated LFTs   . ED (erectile dysfunction)   . Dry skin   . Diverticulosis   . Colon polyps   . Cataract   . Bruises easily   . Bilateral pneumonia   . Benign prostate hyperplasia   . Arthritis   . Anemia   . Atrial fibrillation, chronic 10/09/2016  . Esophageal stricture   . Chronic anticoagulation 08/13/2012  . 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

## 2018-08-03 NOTE — Progress Notes (Signed)
   Subjective/Chief Complaint: none  Pt feels better had BM and flatus    Objective: Vital signs in last 24 hours: Temp:  [97.6 F (36.4 C)-98.7 F (37.1 C)] 98.7 F (37.1 C) (05/04 0400) Pulse Rate:  [79-91] 91 (05/04 0400) BP: (158-208)/(80-119) 158/98 (05/04 0410) SpO2:  [96 %-99 %] 96 % (05/04 0400) Last BM Date: 08/01/18  Intake/Output from previous day: 05/03 0701 - 05/04 0700 In: 360 [P.O.:360] Out: 800 [Urine:800] Intake/Output this shift: Total I/O In: -  Out: 300 [Emesis/NG output:300]  General appearance: alert and cooperative Head: Normocephalic, without obvious abnormality, atraumatic GI: distention but non tender  Neurologic: Grossly normal  Lab Results:  Recent Labs    08/02/18 0303 08/03/18 0207  WBC 7.5 10.2  HGB 14.2 14.6  HCT 43.1 45.0  PLT 142* 145*   BMET Recent Labs    08/02/18 0303 08/03/18 0207  NA 146* 142  K 3.6 3.6  CL 105 104  CO2 26 22  GLUCOSE 80 56*  BUN 19 15  CREATININE 1.24 1.20  CALCIUM 9.2 8.7*   PT/INR No results for input(s): LABPROT, INR in the last 72 hours. ABG No results for input(s): PHART, HCO3 in the last 72 hours.  Invalid input(s): PCO2, PO2  Studies/Results: Dg Abd Portable 1v  Result Date: 08/02/2018 CLINICAL DATA:  Small bowel obstruction. EXAM: PORTABLE ABDOMEN - 1 VIEW COMPARISON:  Abdominal x-ray from yesterday. FINDINGS: Enteric tube in the stomach. Small bowel dilatation has improved with persistent mild dilatation of several proximal small bowel loops. Oral contrast is seen within the relatively decompressed colon. No acute osseous abnormality. IMPRESSION: 1. Improving small bowel obstruction. Electronically Signed   By: Titus Dubin M.D.   On: 08/02/2018 07:44    Anti-infectives: Anti-infectives (From admission, onward)   None      Assessment/Plan: Patient Active Problem List   Diagnosis Date Noted  . Chronic atrial fibrillation   . SBO (small bowel obstruction) ([place]) 07/30/2018   . Abnormal echocardiogram 05/20/2018  . Vitamin D deficiency   . Nocturia   . Internal hemorrhoids   . Impaired glucose tolerance   . Hyperlipidemia   . Hearing loss   . Gout   . Glaucoma   . GERD (gastroesophageal reflux disease)   . Fatty liver   . Esophagitis   . Enlarged prostate   . Elevated LFTs   . ED (erectile dysfunction)   . Dry skin   . Diverticulosis   . Colon polyps   . Cataract   . Bruises easily   . Bilateral pneumonia   . Benign prostate hyperplasia   . Arthritis   . Anemia   . Atrial fibrillation, chronic 10/09/2016  . Esophageal stricture   . Chronic anticoagulation 08/13/2012  . Benign neoplasm of colon 06/21/2003    SBO improving  Clamp NGT  Check KUB today Needs to be OOB    LOS: 4 days    Marcello Moores A Krystall Kruckenberg 08/03/2018

## 2018-08-03 NOTE — Plan of Care (Signed)
  Problem: Clinical Measurements: Goal: Ability to maintain clinical measurements within normal limits will improve Outcome: Progressing   Problem: Activity: Goal: Risk for activity intolerance will decrease Outcome: Progressing   Problem: Nutrition: Goal: Adequate nutrition will be maintained Outcome: Progressing   Problem: Coping: Goal: Level of anxiety will decrease Outcome: Progressing   Problem: Elimination: Goal: Will not experience complications related to bowel motility Outcome: Progressing   Problem: Skin Integrity: Goal: Risk for impaired skin integrity will decrease Outcome: Progressing

## 2018-08-03 NOTE — Care Management Important Message (Signed)
Important Message  Patient Details  Name: Randy Fuller MRN: [id number] Date of Birth: 13-Aug-1933   Medicare Important Message Given:  Yes    [name] 08/03/2018, 2:44 PM

## 2018-08-04 DIAGNOSIS — N401 Enlarged prostate with lower urinary tract symptoms: Secondary | ICD-10-CM

## 2018-08-04 DIAGNOSIS — R3911 Hesitancy of micturition: Secondary | ICD-10-CM

## 2018-08-04 DIAGNOSIS — I4891 Unspecified atrial fibrillation: Secondary | ICD-10-CM

## 2018-08-04 LAB — BASIC METABOLIC PANEL
Anion gap: 11 (ref 5–15)
BUN: 9 mg/dL (ref 8–23)
CO2: 30 mmol/L (ref 22–32)
Calcium: 8.9 mg/dL (ref 8.9–10.3)
Chloride: 98 mmol/L (ref 98–111)
Creatinine, Ser: 1.08 mg/dL (ref 0.61–1.24)
GFR calc Af Amer: >60 mL/min (ref >60)
GFR calc non Af Amer: >60 mL/min (ref >60)
Glucose, Bld: 139 mg/dL — ABNORMAL HIGH (ref 70–99)
Potassium: 2.9 mmol/L — ABNORMAL LOW (ref 3.5–5.1)
Sodium: 139 mmol/L (ref 135–145)

## 2018-08-04 LAB — GLUCOSE, CAPILLARY
Glucose-Capillary: 132 mg/dL — ABNORMAL HIGH (ref 70–99)
Glucose-Capillary: 140 mg/dL — ABNORMAL HIGH (ref 70–99)

## 2018-08-04 LAB — APTT: aPTT: 59 seconds — ABNORMAL HIGH (ref 24–36)

## 2018-08-04 MED ORDER — TAMSULOSIN HCL 0.4 MG PO CAPS
0.4000 mg | ORAL_CAPSULE | Freq: Every day | ORAL | Status: DC
  Administered 2018-08-04 – 2018-08-05 (×2): 0.4 mg via ORAL
  Filled 2018-08-04 (×2): qty 1

## 2018-08-04 NOTE — Plan of Care (Signed)
  Problem: Clinical Measurements: Goal: Cardiovascular complication will be avoided Outcome: Progressing   Problem: Activity: Goal: Risk for activity intolerance will decrease Outcome: Progressing   Problem: Nutrition: Goal: Adequate nutrition will be maintained Outcome: Progressing   Problem: Coping: Goal: Level of anxiety will decrease Outcome: Progressing   Problem: Elimination: Goal: Will not experience complications related to bowel motility Outcome: Progressing Goal: Will not experience complications related to urinary retention Outcome: Progressing   Problem: Skin Integrity: Goal: Risk for impaired skin integrity will decrease Outcome: Progressing

## 2018-08-04 NOTE — Progress Notes (Signed)
2200 Pt c/o difficulty and pain in urination. Said he missed a dose of flomax for 7 days. Bladder scan shows 677ml of urine in the bladder. Md notified with the order to do in and out cath. 2245- In and out cath done. Obtained 524ml of amber colored urine. Pt said "I feel so much better".

## 2018-08-04 NOTE — Progress Notes (Signed)
ANTICOAGULATION CONSULT NOTE  Pharmacy Consult: Argatroban Indication: atrial fibrillation  Patient Measurements: Height: 5\' 11"  (180.3 cm) Weight: 151 lb (68.5 kg) IBW/kg (Calculated) : 75.3  Vital Signs: Temp: 98.2 F (36.8 C) (05/05 0451) Temp Source: Oral (05/05 0451) BP: 170/96 (05/05 0451) Pulse Rate: 95 (05/05 0451)  Labs: Recent Labs    08/02/18 0303  08/02/18 1456 08/03/18 0207 08/04/18 0149  HGB 14.2  --   --  14.6  --   HCT 43.1  --   --  45.0  --   PLT 142*  --   --  145*  --   APTT 49*   < > 52* 53* 59*  CREATININE 1.24  --   --  1.20  --    < > = values in this interval not displayed.   Assessment: 85 yo male on Eliquis PTA for AFib. He has not had a dose in a couple days. Originally planned to heparin infusion; however, the patient has an allergy listed to heparin. I discussed this with him, he would prefer not to have a medication that contains pork products.   AM aPTT remains therapeutic at 59 seconds, CBC wnl and no bleeding reported.    Goal of Therapy:  APTT 50-85 sec Monitor platelets by anticoagulation protocol: Yes   Plan:  Continue argatroban at 1.8 mcg/kg/min Daily aPTT, CBC, s/s bleeding  Vertis Kelch, PharmD PGY1 Pharmacy Resident Phone [phone] 08/04/2018       7:14 AM

## 2018-08-04 NOTE — Progress Notes (Signed)
Physical Therapy Treatment Patient Details Name: Randy Fuller MRN: [id number] DOB: [birth year] Today's Date: 08/04/2018    History of Present Illness Pt is an 85 y.o. male admitted 07/30/18 with abdominal pain and distension. CT showed high grade SBO. Plan for conservative medical management; NGT placed. PMH includes afib on Eliquis, HTN, L inguinal hernia repair (2014).    PT Comments    Pt seated in recliner on arrival and NGT hanging low with out tape to secure it.  Informed nursing on how pt was found and RN entered to d/c NGT per order.  Pt progressing well.  He continues to require min guard for transfers and gt training.  Plan for stair training and HEP next session to prepare for return home.    Follow Up Recommendations  Home health PT;Supervision for mobility/OOB     Equipment Recommendations  Rolling walker with 5" wheels;Other (comment)    Recommendations for Other Services       Precautions / Restrictions Precautions Precautions: Fall;Other (comment) Precaution Comments: NGT d/c's 08/04/2018 Restrictions Weight Bearing Restrictions: No    Mobility  Bed Mobility               General bed mobility comments: Pt seated in chair on arrival.    Transfers Overall transfer level: Needs assistance Equipment used: Rolling walker (2 wheeled) Transfers: Sit to/from Stand Sit to Stand: Min guard         General transfer comment: Pt required increased time to achieve standing.    Ambulation/Gait Ambulation/Gait assistance: Min guard Gait Distance (Feet): 300 Feet Assistive device: Rolling walker (2 wheeled) Gait Pattern/deviations: Step-through pattern;Decreased stride length;Drifts right/left;Narrow base of support Gait velocity: decreased   General Gait Details: Cues to increase BOS and increase stride length.  Pt with narrow almost tandem with gait pattern initially.     Stairs             Wheelchair Mobility    Modified Rankin (Stroke  Patients Only)       Balance Overall balance assessment: Needs assistance Sitting-balance support: Feet supported Sitting balance-Leahy Scale: Good       Standing balance-Leahy Scale: Fair                              Cognition Arousal/Alertness: Awake/alert Behavior During Therapy: WFL for tasks assessed/performed Overall Cognitive Status: Within Functional Limits for tasks assessed                                        Exercises General Exercises - Lower Extremity Ankle Circles/Pumps: AROM;10 reps;Supine;Both Quad Sets: AROM;Both;10 reps;Supine Long Arc Quad: AROM;10 reps;Seated;Both Hip Flexion/Marching: AROM;10 reps;Seated;Both    General Comments        Pertinent Vitals/Pain Pain Assessment: No/denies pain    Home Living                      Prior Function            PT Goals (current goals can now be found in the care plan section) Acute Rehab PT Goals Patient Stated Goal: to be independent Potential to Achieve Goals: Good Progress towards PT goals: Progressing toward goals    Frequency    Min 3X/week      PT Plan Current plan remains appropriate    Co-evaluation  AM-PAC PT "6 Clicks" Mobility   Outcome Measure  Help needed turning from your back to your side while in a flat bed without using bedrails?: None Help needed moving from lying on your back to sitting on the side of a flat bed without using bedrails?: None Help needed moving to and from a bed to a chair (including a wheelchair)?: None Help needed standing up from a chair using your arms (e.g., wheelchair or bedside chair)?: A Little Help needed to walk in hospital room?: A Little Help needed climbing 3-5 steps with a railing? : A Little 6 Click Score: 21    End of Session Equipment Utilized During Treatment: Gait belt Activity Tolerance: Patient tolerated treatment well Patient left: in chair;with call bell/phone within  reach Nurse Communication: Mobility status PT Visit Diagnosis: Other abnormalities of gait and mobility (R26.89);Muscle weakness (generalized) (M62.81)     Time: 6294-7654 PT Time Calculation (min) (ACUTE ONLY): 24 min  Charges:  $Gait Training: 8-22 mins $Therapeutic Exercise: 8-22 mins                     Governor Rooks, PTA Acute Rehabilitation Services Pager [phone] Office [phone]     Myiesha Edgar Eli Hose 08/04/2018, 10:40 AM

## 2018-08-04 NOTE — Progress Notes (Signed)
   Subjective/Chief Complaint: Nausea Patient feels well with NG tube clamped overnight.  He has had multiple bowel movements.  He has no nausea, vomiting or abdominal pain this morning.   Objective: Vital signs in last 24 hours: Temp:  [98.2 F (36.8 C)-98.8 F (37.1 C)] 98.2 F (36.8 C) (05/05 0451) Pulse Rate:  [83-95] 95 (05/05 0451) Resp:  [16-18] 18 (05/05 0451) BP: (170-178)/(96-122) 170/96 (05/05 0451) SpO2:  [95 %-97 %] 97 % (05/05 0451) Last BM Date: 08/03/18  Intake/Output from previous day: 05/04 0701 - 05/05 0700 In: 4763.6 [P.O.:240; I.V.:4523.6] Out: 1690 [Urine:1090; Emesis/NG output:600] Intake/Output this shift: Total I/O In: -  Out: 260 [Urine:260]  General appearance: alert and cooperative Head: Normocephalic, without obvious abnormality, atraumatic GI: Nontender.  Less distended.  No rebound or guarding.  Lab Results:  Recent Labs    08/02/18 0303 08/03/18 0207  WBC 7.5 10.2  HGB 14.2 14.6  HCT 43.1 45.0  PLT 142* 145*   BMET Recent Labs    08/02/18 0303 08/03/18 0207  NA 146* 142  K 3.6 3.6  CL 105 104  CO2 26 22  GLUCOSE 80 56*  BUN 19 15  CREATININE 1.24 1.20  CALCIUM 9.2 8.7*   PT/INR No results for input(s): LABPROT, INR in the last 72 hours. ABG No results for input(s): PHART, HCO3 in the last 72 hours.  Invalid input(s): PCO2, PO2  Studies/Results: Dg Abd 1 View  Result Date: 08/03/2018 CLINICAL DATA:  Abdominal pain and distension. Assess for small-bowel obstruction EXAM: ABDOMEN - 1 VIEW COMPARISON:  Aug 02, 2018 FINDINGS: Air-filled dilated small bowel loops are identified with the number of dilated small bowel loops increased compared to prior exam. The colon is decompressed with contrast material present. IMPRESSION: Air-filled dilated small bowel loops are identified throughout the abdomen and pelvis, the number of dilated small bowel loops are increased compared to prior exam, suggesting worsening small bowel  obstruction. Electronically Signed   By: Abelardo Diesel M.D.   On: 08/03/2018 10:05    Anti-infectives: Anti-infectives (From admission, onward)   None      Assessment/Plan: Resolving small bowel obstruction  Discontinue NG tube  Advance diet to clears     LOS: 5 days    Marcello Moores A Edgar Corrigan 08/04/2018

## 2018-08-04 NOTE — Plan of Care (Signed)
  Problem: Activity: Goal: Risk for activity intolerance will decrease Outcome: Progressing   Problem: Nutrition: Goal: Adequate nutrition will be maintained Outcome: Progressing   Problem: Elimination: Goal: Will not experience complications related to bowel motility Outcome: Progressing   

## 2018-08-04 NOTE — Progress Notes (Signed)
Family Medicine Teaching Service Daily Progress Note Intern Pager: [phone]  Patient name: Randy Fuller Medical record number: [id number] Date of birth: Jul 28, 1933 Age: 85 y.o. Gender: male  Primary Care Provider: Derinda Late, MD Consultants: surgery Code Status: full  Pt Overview and Major Events to Date:    Assessment and Plan: Randy Fuller. PMH is significant forAtrial Fibrillation on Eliquis, Diverticulosis, HTN, HLD,  Small Bowel Obstruction - no abdominal Fuller, having BM flatus.  NGT clamped yesterday. Abdominal xray showed movement of contrast into he colon. Likely to start diet today, will wait for surgery recommendations.    - surgery consulted, appreciate recs - SBO protocol: NGT>advance diet  - mIVF 50cc/hr D5 1/2NS  - cbg q12h - IV tylenol q6h - reorder daily if needed.   - discontinued IV morphine PRN 1mg  q2h.  - IV zofran 4mg  q6h for nausea - NPO - argatroban per pharm - OOB w/ assistance - PT: rec rolling walker w/ 5" wheels, HHPT.    A fib on eliquis/Bradycardia -recently seen by Dr. Gwenlyn Found for fatigue and was bradycardic to the 30-40s.  Referred to EP, has not seen them.  Echo in 09/2017 showed EF 55-60%, RA and LA severely dilated, moderatly increased pulmonary artery pressure. HR has been in the 80-90s.   - holding eliquis while NPO, - argatroban drip per pharm.  Will need to stop 6 hrs prior to any procedure - consider EP eval while inpatient - cardiac tele  HTN - on norvasc 2.5mg  QD and losartan 50mg  QD at home.  Will need to hold home oral BP medds while NPO d/t SBO. SBP overnight 170s.  - hold home meds - can use hydral if in hypertensive urgency  HLD - on crestor 40mg  qdaily at home.   - hold crestor while NPO  Gout - allopurinol 450mg  qdaily at home - holding allopurinol  Glaucoma - cosopt and xalatan at [place] - continue home meds  BPH - on finasteride and tamsulosin at  home.   - holding home meds while NPO - can bladder scan/I and O cath if needed  CKD stage 3A - Cr 1.2 this am - am bmp.   Hypernatremia - 505>397>673.  On half normal saline - continue half normal saline w/ dextrose  FEN/GI: NPO.   PPx: argatroban  Disposition: home  Subjective:  Patient says he is not having abdominal Fuller, n/v.  He is having bowel movements and flatus.  He did not look at his bowel movement to see if it was bloody.  He would like to get the NG tube out.    Objective: Temp:  [98.2 F (36.8 C)-98.8 F (37.1 C)] 98.2 F (36.8 C) (05/05 0451) Pulse Rate:  [83-95] 95 (05/05 0451) Resp:  [16-18] 18 (05/05 0451) BP: (170-178)/(96-122) 170/96 (05/05 0451) SpO2:  [95 %-97 %] 97 % (05/05 0451) Physical Exam: General: alert and oriented.  No acute distress.   Cardiovascular: irregularly irregular rhythm. Normal rate.  No murmurs.  2+ radial pulse bilaterally.   Respiratory: LCTAB.  No tachypnea, no wheezes or crackles.   Abdomen: soft, nontender.  Normal bowel sounds.     Laboratory: Recent Labs  Lab 08/01/18 0217 08/02/18 0303 08/03/18 0207  WBC 5.1 7.5 10.2  HGB 14.3 14.2 14.6  HCT 44.4 43.1 45.0  PLT 135* 142* 145*   Recent Labs  Lab 07/30/18 1009  08/01/18 0217 08/02/18 0303 08/03/18 0207  NA 139   < >  148* 146* 142  K 5.2*   < > 3.6 3.6 3.6  CL 100   < > 105 105 104  CO2 26   < > 29 26 22   BUN 28*   < > 22 19 15   CREATININE 1.38*   < > 1.36* 1.24 1.20  CALCIUM 10.0   < > 9.4 9.2 8.7*  PROT 7.7  --   --   --   --   BILITOT 2.1*  --   --   --   --   ALKPHOS 85  --   --   --   --   ALT 16  --   --   --   --   AST 54*  --   --   --   --   GLUCOSE 138*   < > 74 80 56*   < > = values in this interval not displayed.      Imaging/Diagnostic Tests: AXR: improving small bowel obstruction  Benay Pike, MD 08/04/2018, 6:30 AM PGY-1, [place] Intern pager: [phone], text pages welcome

## 2018-08-05 LAB — APTT: aPTT: 57 seconds — ABNORMAL HIGH (ref 24–36)

## 2018-08-05 LAB — GLUCOSE, CAPILLARY: Glucose-Capillary: 138 mg/dL — ABNORMAL HIGH (ref 70–99)

## 2018-08-05 MED ORDER — FINASTERIDE 5 MG PO TABS
5.0000 mg | ORAL_TABLET | Freq: Every day | ORAL | Status: DC
  Administered 2018-08-05: 11:00:00 5 mg via ORAL
  Filled 2018-08-05: qty 1

## 2018-08-05 MED ORDER — POTASSIUM CHLORIDE CRYS ER 20 MEQ PO TBCR
40.0000 meq | EXTENDED_RELEASE_TABLET | Freq: Two times a day (BID) | ORAL | Status: DC
  Administered 2018-08-05: 11:00:00 40 meq via ORAL
  Filled 2018-08-05: qty 2

## 2018-08-05 MED ORDER — TAMSULOSIN HCL 0.4 MG PO CAPS: 0.8000 mg | ORAL_CAPSULE | Freq: Every day | ORAL | 0 refills | Status: AC

## 2018-08-05 MED ORDER — TAMSULOSIN HCL 0.4 MG PO CAPS: 0.8000 mg | ORAL_CAPSULE | Freq: Every day | ORAL | Status: DC

## 2018-08-05 MED ORDER — APIXABAN 5 MG PO TABS
5.0000 mg | ORAL_TABLET | Freq: Two times a day (BID) | ORAL | Status: DC
  Administered 2018-08-05: 11:00:00 5 mg via ORAL
  Filled 2018-08-05: qty 1

## 2018-08-05 NOTE — Progress Notes (Signed)
[facility] Progress Note     Subjective: CC-  Up in chair this morning. Having issues with urinary retention over night, has required I&O cath x2. States that he has not been able to urinate since last I&O around 0400 this morning.   Tolerating clear liquids. Denies n/v. BM x4 yesterday. Passing flatus this morning.  Objective: Vital signs in last 24 hours: Temp:  [97.5 F (36.4 C)-98.9 F (37.2 C)] 97.7 F (36.5 C) (05/06 0400) Pulse Rate:  [77-94] 94 (05/05 1945) Resp:  [16-20] 20 (05/06 0400) BP: (129-160)/(80-109) 160/96 (05/06 0400) SpO2:  [94 %-97 %] 97 % (05/06 0400) Last BM Date: 08/04/18  Intake/Output from previous day: 05/05 0701 - 05/06 0700 In: 847.5 [P.O.:120; I.V.:727.5] Out: 2254 [Urine:1635] Intake/Output this shift: No intake/output data recorded.  PE: Gen:  Alert, NAD, pleasant HEENT: EOM's intact, pupils equal and round Pulm:  effort normal Abd: Soft, mild distension, +BS, TTP suprapubic region Ext:  Calves soft and nontender without edema Skin: no rashes noted, warm and dry  Lab Results:  Recent Labs    08/03/18 0207  WBC 10.2  HGB 14.6  HCT 45.0  PLT 145*   BMET Recent Labs    08/03/18 0207 08/04/18 1230  NA 142 139  K 3.6 2.9*  CL 104 98  CO2 22 30  GLUCOSE 56* 139*  BUN 15 9  CREATININE 1.20 1.08  CALCIUM 8.7* 8.9   PT/INR No results for input(s): LABPROT, INR in the last 72 hours. CMP     Component Value Date/Time   NA 139 08/04/2018 1230   NA 145 (H) 07/21/2018 0906   K 2.9 (L) 08/04/2018 1230   CL 98 08/04/2018 1230   CO2 30 08/04/2018 1230   GLUCOSE 139 (H) 08/04/2018 1230   BUN 9 08/04/2018 1230   BUN 18 07/21/2018 0906   CREATININE 1.08 08/04/2018 1230   CALCIUM 8.9 08/04/2018 1230   PROT 7.7 07/30/2018 1009   ALBUMIN 4.1 07/30/2018 1009   AST 54 (H) 07/30/2018 1009   ALT 16 07/30/2018 1009   ALKPHOS 85 07/30/2018 1009   BILITOT 2.1 (H) 07/30/2018 1009   GFRNONAA >60 08/04/2018 1230   GFRAA  >60 08/04/2018 1230   Lipase     Component Value Date/Time   LIPASE 66 (H) 07/30/2018 1009       Studies/Results: Dg Abd 1 View  Result Date: 08/03/2018 CLINICAL DATA:  Abdominal pain and distension. Assess for small-bowel obstruction EXAM: ABDOMEN - 1 VIEW COMPARISON:  Aug 02, 2018 FINDINGS: Air-filled dilated small bowel loops are identified with the number of dilated small bowel loops increased compared to prior exam. The colon is decompressed with contrast material present. IMPRESSION: Air-filled dilated small bowel loops are identified throughout the abdomen and pelvis, the number of dilated small bowel loops are increased compared to prior exam, suggesting worsening small bowel obstruction. Electronically Signed   By: Abelardo Diesel M.D.   On: 08/03/2018 10:05    Anti-infectives: Anti-infectives (From admission, onward)   None       Assessment/Plan A fib on eliquis HTN HLD Gout Glaucoma CKD-III BPH Urinary retention - per primary, I asked RN to bladder scan patient again, may need foley. Home meds (finasteride and tamsulosin) restarted  SBO  - SBO seems to be resolving. Tolerating clears and having bowel function. Advance to fulls and advance diet as tolerated. Mobilize.   ID - none FEN - FLD ADAT VTE - eliquis Foley - none currently Follow up -  no surgical follow up    LOS: 6 days    Wellington [place] , [facility] 08/05/2018, 9:09 AM Pager: [phone] Mon-Thurs 7:00 am-4:30 pm Fri 7:00 am -11:30 AM Sat-Sun 7:00 am-11:30 am

## 2018-08-05 NOTE — Discharge Instructions (Signed)
Dear Randy Fuller,   Thank you for letting us participate in your care! In this section, you will find a brief hospital admission summary of why you were admitted to the hospital, what happened during your admission, your diagnosis/diagnoses, and recommended follow up.   You were admitted because you were experiencing abdominal pain.  You were consulted by surgery and did not require any surgical intervention as her pain continued to improve.  You did experience some urinary retention towards the end of your admission, likely due to not taking any medications, including your Flomax and Proscar.  We started these medications back on 08/05/2018 and expect your urinary retention to improve.  We placed a Foley on the day of discharge.  Please see your primary care provider within 3-5 days after discharge to remove the Foley and to trial urinating on your own.  We increased the Flomax to 0.8 mg from 0.4 mg to help with the urinary retention.  If you continue to have difficulty with urination, call your primary doctor or come to the ED.   DOCTOR'S APPOINTMENT & FOLLOW UP CARE INSTRUCTIONS  Future Appointments  Date Time Provider [place]  08/17/2018  9:00 AM Deboraha Sprang, MD [facility]   Thank you for choosing [facility]! Take care and be well!  [facility]  [place], [place] [address] [phone]

## 2018-08-05 NOTE — Progress Notes (Signed)
Family Medicine Teaching Service Daily Progress Note Intern Pager: [phone]  Patient name: Randy Fuller Medical record number: [id number] Date of birth: 1933-08-11 Age: 85 y.o. Gender: male  Primary Care Provider: Derinda Late, MD Consultants: IP CONSULT TO GENERAL SURGERY CONSULT FOR UNASSIGNED MEDICAL ADMISSION [place] PER PHARMACY CONSULT CONSULT TO CARE MANAGEMENT Code Status: Full Code   Pt Overview and Major Events to Date:  Hospital Day: 7 07/30/2018: admitted for Abdominal Pain   Assessment and Plan: Randy Fuller is a 85 y.o. male who presented w/ Abdominal Pain  [name]  has a past medical history of Anemia, Arthritis, Atrial fibrillation ([place]), Atrial fibrillation ([place]), Benign prostate hyperplasia, Bilateral pneumonia, Bruises easily, Cataract, Colon polyps, Diverticulosis, Dry skin, ED (erectile dysfunction), Elevated LFTs, Enlarged prostate, Esophageal stricture, Esophagitis, Fatty liver, GERD (gastroesophageal reflux disease), Glaucoma, Gout, Hearing loss, Hernia, Hyperlipidemia, Impaired glucose tolerance, Internal hemorrhoids, Irregular heart beat, Nocturia, and Vitamin D deficiency.  # SBO Surgery consulted and do not recommend any surgical intervention at this time.  Patient tolerated clear liquids well yesterday.  Will transition to full liquid diet  Discontinue IV fluids  CBG every 12 hours  IV Zofran PRN nausea  #Urinary retention Patient experiencing urinary retention since yesterday.  Patient had discomfort and bladder scan showed 619 cc.  Followed by in and out cath.  Neck scan showed 422 cc, followed by in and out cath.  This is likely been exacerbated by patient being n.p.o. and not being able to take his Flomax and Proscar. Will place Foley x24 hours to avoid repeat in and out caths  Increase Flomax to 0.8 mg  Start Proscar, first dose at 10 AM  Voiding trial tomorrow  #A. fib on Eliquis Previously, patient bradycardic to the 30s to  40s.  Referred to EP but did not follow-up.  Ejection fraction in 10/18/2017 was 55 to 60% with bilateral atrial dilation.  Heart rate has been stable in the 80s to 90s while here  Eliquis started  Follow-up outpatient  #HLD  Restart home Crestor  #Gout  Restart daily for 50mg  allopurinol  #CKD 3 A, stable  #Hypernatremia, resolved  Discontinue fluids  #FEN/GI: Full liquids   Access: R PIV VTE prophylaxis: Chronically Anticoagulated   Disposition: Home pending urinary improvement.  ================================================================ Subjective:  NAEO.   Objective: Temp:  [97.5 F (36.4 C)-98.9 F (37.2 C)] 97.7 F (36.5 C) (05/06 0400) Pulse Rate:  [77-94] 94 (05/05 1945) Cardiac Rhythm: Atrial fibrillation (05/05 1904) Resp:  [16-20] 20 (05/06 0400) BP: (129-160)/(80-109) 160/96 (05/06 0400) SpO2:  [94 %-97 %] 97 % (05/06 0400) 05/05 0701 - 05/06 0700 In: 302.6 [I.V.:302.6] Out: 2254 [Urine:1635]   Physical Exam: General: NAD, non-toxic, sitting up in chair, appears uncomfortable, actively attempting to urinate into urinal without success Cardiovascular: RRR, normal S1, S2. 2+ RP bilaterally. No BLEE  Respiratory: CTAB. No IWOB.  Abdomen: + BS. NT, ND, soft to palpation.  Extremities: Warm and well perfused. Moving spontaneously.   Laboratory: I have personally read and reviewed all labs and imaging studies.  CBC: Recent Labs  Lab 07/30/18 1009  08/01/18 0217 08/02/18 0303 08/03/18 0207  WBC 4.4   < > 5.1 7.5 10.2  NEUTROABS 2.6  --   --   --   --   HGB 16.1   < > 14.3 14.2 14.6  HCT 49.0   < > 44.4 43.1 45.0  MCV 91.9   < > 92.3 92.3 91.8  PLT 140*   < >  135* 142* 145*   < > = values in this interval not displayed.   CMP: Recent Labs  Lab 07/30/18 1009 07/31/18 0129  08/02/18 0303 08/03/18 0207 08/04/18 1230  NA 139 142   < > 146* 142 139  K 5.2* 3.4*   < > 3.6 3.6 2.9*  CL 100 101   < > 105 104 98  CO2 26 29   < > 26 22 30    GLUCOSE 138* 104*   < > 80 56* 139*  BUN 28* 23   < > 19 15 9   CREATININE 1.38* 1.27*   < > 1.24 1.20 1.08  CALCIUM 10.0 9.3   < > 9.2 8.7* 8.9  MG  --  2.1  --   --   --   --   ALBUMIN 4.1  --   --   --   --   --    < > = values in this interval not displayed.     GFR: Estimated Creatinine Clearance: 50.2 mL/min (by C-G formula based on SCr of 1.08 mg/dL).  CBG: Recent Labs  Lab 08/04/18 0847 08/04/18 2343  GLUCAP 132* 140*    No results found for this or any previous visit (from the past 240 hour(s)).   Imaging/Diagnostic Tests: Dg Abd 1 View  Result Date: 08/03/2018 CLINICAL DATA:  Abdominal pain and distension. Assess for small-bowel obstruction EXAM: ABDOMEN - 1 VIEW COMPARISON:  Aug 02, 2018 FINDINGS: Air-filled dilated small bowel loops are identified with the number of dilated small bowel loops increased compared to prior exam. The colon is decompressed with contrast material present. IMPRESSION: Air-filled dilated small bowel loops are identified throughout the abdomen and pelvis, the number of dilated small bowel loops are increased compared to prior exam, suggesting worsening small bowel obstruction. Electronically Signed   By: Abelardo Diesel M.D.   On: 08/03/2018 10:05    Wilber Oliphant, MD 08/05/2018, 6:25 AM PGY-1, [place] Intern pager: [phone], text pages welcome

## 2018-08-05 NOTE — Progress Notes (Signed)
ANTICOAGULATION CONSULT NOTE  Pharmacy Consult: Argatroban Indication: atrial fibrillation  Patient Measurements: Height: 5\' 11"  (180.3 cm) Weight: 151 lb (68.5 kg) IBW/kg (Calculated) : 75.3  Vital Signs: Temp: 97.7 F (36.5 C) (05/06 0400) Temp Source: Oral (05/06 0400) BP: 160/96 (05/06 0400) Pulse Rate: 94 (05/05 1945)  Labs: Recent Labs    08/03/18 0207 08/04/18 0149 08/04/18 1230 08/05/18 0218  HGB 14.6  --   --   --   HCT 45.0  --   --   --   PLT 145*  --   --   --   APTT 53* 59*  --  57*  CREATININE 1.20  --  1.08  --    Assessment: 85 yo male on Eliquis PTA for AFib. He has not had a dose in a couple days. Originally planned to heparin infusion; however, the patient has an allergy listed to heparin. I discussed this with him, he would prefer not to have a medication that contains pork products.   AM aPTT remains therapeutic at 57 seconds, no bleeding reported.    Goal of Therapy:  APTT 50-85 sec Monitor platelets by anticoagulation protocol: Yes   Plan:  Continue argatroban at 1.8 mcg/kg/min Daily aPTT, s/s bleeding Follow up transitioning back to Eliquis now that diet is resumed  Vertis Kelch, PharmD PGY1 Pharmacy Resident Phone [phone] 08/05/2018       7:27 AM

## 2018-08-05 NOTE — Progress Notes (Signed)
Physical Therapy Treatment Patient Details Name: Randy Fuller MRN: [id number] DOB: May 25, 1933 Today's Date: 08/05/2018    History of Present Illness Pt is an 85 y.o. male admitted 07/30/18 with abdominal pain and distension. CT showed high grade SBO. Plan for conservative medical management; NGT placed. PMH includes afib on Eliquis, HTN, L inguinal hernia repair (2014).    PT Comments    Pt performed gait training with progression to stair negotiation.  Plan for return home remains appropriate.  Pt continues to fatigue with activity but remains motivated to return to baseline.  Informed nursing of complaints of soreness on his bottom and requesting ointment.  Plan for retrial of stairs and HEP next session.    Follow Up Recommendations  Home health PT;Supervision for mobility/OOB     Equipment Recommendations  Rolling walker with 5" wheels;Other (comment)    Recommendations for Other Services       Precautions / Restrictions Precautions Precautions: Fall;Other (comment) Precaution Comments: NGT d/c's 08/04/2018 Restrictions Weight Bearing Restrictions: No    Mobility  Bed Mobility Overal bed mobility: Modified Independent Bed Mobility: Supine to Sit     Supine to sit: Supervision;HOB elevated     General bed mobility comments: Pt required cues but able to achieve OOB unassisted.    Transfers Overall transfer level: Needs assistance Equipment used: None Transfers: Sit to/from Stand Sit to Stand: Supervision         General transfer comment: Pt stood impulsively and able to maintain staning statically without device.    Ambulation/Gait Ambulation/Gait assistance: Min guard Gait Distance (Feet): 400 Feet Assistive device: Rolling walker (2 wheeled) Gait Pattern/deviations: Step-through pattern;Decreased stride length;Drifts right/left;Narrow base of support     General Gait Details: Cues for increasing BOS, increasing stride length.  Pt with improved cadence  and endurance.  he did start to fatigue after stair training.     Stairs Stairs: Yes Stairs assistance: Min guard Stair Management: One rail Right;Step to pattern Number of Stairs: 4 General stair comments: Cues for safety.     Wheelchair Mobility    Modified Rankin (Stroke Patients Only)       Balance Overall balance assessment: Needs assistance Sitting-balance support: Feet supported Sitting balance-Leahy Scale: Good       Standing balance-Leahy Scale: Fair Standing balance comment: Stood edge of bed no device with supervision                            Cognition Arousal/Alertness: Awake/alert Behavior During Therapy: WFL for tasks assessed/performed Overall Cognitive Status: Within Functional Limits for tasks assessed                                        Exercises      General Comments        Pertinent Vitals/Pain Pain Assessment: No/denies pain Pain Location: Abdomen and bottom ( reports bottom feel raw) Pain Descriptors / Indicators: Discomfort;Guarding Pain Intervention(s): Monitored during session;Repositioned    Home Living                      Prior Function            PT Goals (current goals can now be found in the care plan section) Acute Rehab PT Goals Patient Stated Goal: to be independent Potential to Achieve Goals: Good Progress towards PT goals:  Progressing toward goals    Frequency    Min 3X/week      PT Plan Current plan remains appropriate    Co-evaluation              AM-PAC PT "6 Clicks" Mobility   Outcome Measure  Help needed turning from your back to your side while in a flat bed without using bedrails?: None Help needed moving from lying on your back to sitting on the side of a flat bed without using bedrails?: None Help needed moving to and from a bed to a chair (including a wheelchair)?: None Help needed standing up from a chair using your arms (e.g., wheelchair or  bedside chair)?: A Little Help needed to walk in hospital room?: A Little Help needed climbing 3-5 steps with a railing? : A Little 6 Click Score: 21    End of Session Equipment Utilized During Treatment: Gait belt Activity Tolerance: Patient tolerated treatment well Patient left: in chair;with call bell/phone within reach Nurse Communication: Mobility status PT Visit Diagnosis: Other abnormalities of gait and mobility (R26.89);Muscle weakness (generalized) (M62.81)     Time: 6962-9528 PT Time Calculation (min) (ACUTE ONLY): 17 min  Charges:  $Gait Training: 8-22 mins                     Governor Rooks, PTA Acute Rehabilitation Services Pager [phone] Office [place] 08/05/2018, 2:32 PM

## 2018-08-05 NOTE — Progress Notes (Addendum)
Occupational Therapy Treatment Patient Details Name: Randy Fuller MRN: [id number] DOB: Oct 04, 1933 Today's Date: 08/05/2018    History of present illness Pt is an 85 y.o. male admitted 07/30/18 with abdominal pain and distension. CT showed high grade SBO. Plan for conservative medical management; NGT placed. PMH includes afib on Eliquis, HTN, L inguinal hernia repair (2014).   OT comments  Limited tx.  Pt was on toilet.  Completed toileting, grooming and back to bed. Pt much steadier with RW; min A to walk to sink without it  Follow Up Recommendations  Supervision/Assistance - 24 hour    Equipment Recommendations  May need 3:1; has comfort height commode at home. Getting off high toilet with grab bar was effortful    Recommendations for Other Services      Precautions / Restrictions Precautions Precautions: Fall Precaution Comments: NGT d/c's 08/04/2018 Restrictions Weight Bearing Restrictions: No       Mobility Bed Mobility Overal bed mobility: Modified Independent Bed Mobility: Supine to Sit     Sit to supine: Supervision     General bed mobility comments: assist for catheter  Transfers Overall transfer level: Needs assistance Equipment used: None Transfers: Sit to/from Stand Sit to Stand: Min guard         General transfer comment: for safety; effortful from high commode    Balance Overall balance assessment: Needs assistance Sitting-balance support: Feet supported Sitting balance-Leahy Scale: Good       Standing balance-Leahy Scale: Fair Standing balance comment: Stood edge of bed no device with supervision                           ADL either performed or assessed with clinical judgement   ADL       Grooming: Wash/dry Radiographer, therapeutic: Min guard;Ambulation;Comfort height toilet;Grab bars   Toileting- Clothing Manipulation and Hygiene: Min guard;Sit to/from stand          General ADL Comments: pt was in bathroom without RW.  Min A to ambulate to sink due to unsteadiness. Once he got to door, RW was within reach. Min guard back to bed. Pt fatiqued and declined further activity     Vision       Perception     Praxis      Cognition Arousal/Alertness: Awake/alert Behavior During Therapy: WFL for tasks assessed/performed Overall Cognitive Status: Within Functional Limits for tasks assessed                                          Exercises     Shoulder Instructions       General Comments      Pertinent Vitals/ Pain       Pain Assessment: Faces Faces Pain Scale: Hurts a little bit Pain Location: bottom Pain Descriptors / Indicators: Discomfort Pain Intervention(s): Limited activity within patient's tolerance;Monitored during session;Repositioned(applied barrier cream; pt didn't want to request meds)  Home Living                                          Prior Functioning/Environment  Frequency  Min 2X/week        Progress Toward Goals  OT Goals(current goals can now be found in the care plan section)  Progress towards OT goals: Progressing toward goals  Acute Rehab OT Goals Patient Stated Goal: to be independent  Plan      Co-evaluation                 AM-PAC OT "6 Clicks" Daily Activity     Outcome Measure   Help from another person eating meals?: None Help from another person taking care of personal grooming?: A Little Help from another person toileting, which includes using toliet, bedpan, or urinal?: A Little Help from another person bathing (including washing, rinsing, drying)?: A Little Help from another person to put on and taking off regular upper body clothing?: A Little Help from another person to put on and taking off regular lower body clothing?: A Little 6 Click Score: 19    End of Session    OT Visit Diagnosis: Unsteadiness on feet (R26.81);Muscle  weakness (generalized) (M62.81)   Activity Tolerance Patient limited by fatigue   Patient Left in bed;with call bell/phone within reach;with bed alarm set   Nurse Communication          Time: 2620-3559 OT Time Calculation (min): 12 min  Charges: OT General Charges $OT Visit: 1 Visit OT Treatments $Self Care/Home Management : 8-22 mins  Lesle Chris, OTR/L Acute Rehabilitation Services [phone] [place] pager [phone] office 08/05/2018   [place] 08/05/2018, 3:33 PM

## 2018-08-05 NOTE — Progress Notes (Signed)
Discharged pt to home, with foley catheter, instructions given and explained. Pt is alert, oriented and not in distress. Belongings returned accordingly.

## 2018-08-05 NOTE — Progress Notes (Signed)
Pt cont to c/o difficulty and pain in urination. Bladder scan showed 478ml of urine in the bladder. Text page on call MD. Received order to do In and out cath x1. Also to start on Proscar 5mg  daily.@0500 -In and out cath done and noted with 562ml yellow urine output.

## 2018-08-10 DIAGNOSIS — R339 Retention of urine, unspecified: Secondary | ICD-10-CM | POA: Diagnosis not present

## 2018-08-10 DIAGNOSIS — N401 Enlarged prostate with lower urinary tract symptoms: Secondary | ICD-10-CM | POA: Diagnosis not present

## 2018-08-10 DIAGNOSIS — R1084 Generalized abdominal pain: Secondary | ICD-10-CM | POA: Diagnosis not present

## 2018-08-10 DIAGNOSIS — I10 Essential (primary) hypertension: Secondary | ICD-10-CM | POA: Diagnosis not present

## 2018-08-10 DIAGNOSIS — R972 Elevated prostate specific antigen [PSA]: Secondary | ICD-10-CM | POA: Diagnosis not present

## 2018-08-11 ENCOUNTER — Telehealth: Payer: Self-pay

## 2018-08-11 NOTE — Telephone Encounter (Signed)

## 2018-08-12 ENCOUNTER — Other Ambulatory Visit (HOSPITAL_COMMUNITY): Payer: Medicare HMO

## 2018-08-12 ENCOUNTER — Other Ambulatory Visit: Payer: Self-pay

## 2018-08-12 ENCOUNTER — Other Ambulatory Visit: Payer: Self-pay | Admitting: Cardiovascular Disease

## 2018-08-12 ENCOUNTER — Telehealth: Payer: Self-pay | Admitting: *Deleted

## 2018-08-12 ENCOUNTER — Other Ambulatory Visit (HOSPITAL_COMMUNITY)
Admission: RE | Admit: 2018-08-12 | Discharge: 2018-08-12 | Disposition: A | Discharge status: Home / Self Care | Payer: Medicare HMO | Source: Ambulatory Visit | Attending: Internal Medicine | Admitting: Internal Medicine

## 2018-08-12 ENCOUNTER — Inpatient Hospital Stay (HOSPITAL_COMMUNITY): Admission: RE | Admit: 2018-08-12 | Payer: Medicare HMO | Source: Ambulatory Visit

## 2018-08-12 DIAGNOSIS — Z20828 Contact with and (suspected) exposure to other viral communicable diseases: Secondary | ICD-10-CM | POA: Insufficient documentation

## 2018-08-12 LAB — SARS CORONAVIRUS 2 BY RT PCR (HOSPITAL ORDER, PERFORMED IN ~~LOC~~ HOSPITAL LAB): SARS Coronavirus 2: NEGATIVE

## 2018-08-12 NOTE — Telephone Encounter (Signed)
Scheduled appointment for drive thru testing due to possible exposure. Today at 1:05p. Called [facility] Team.

## 2018-08-13 ENCOUNTER — Ambulatory Visit (INDEPENDENT_AMBULATORY_CARE_PROVIDER_SITE_OTHER): Payer: Medicare HMO | Admitting: Podiatry

## 2018-08-13 ENCOUNTER — Telehealth: Payer: Self-pay | Admitting: *Deleted

## 2018-08-13 ENCOUNTER — Encounter: Payer: Self-pay | Admitting: Podiatry

## 2018-08-13 VITALS — BP 150/81 | Temp 97.2°F

## 2018-08-13 DIAGNOSIS — M79675 Pain in left toe(s): Secondary | ICD-10-CM | POA: Diagnosis not present

## 2018-08-13 DIAGNOSIS — M79674 Pain in right toe(s): Secondary | ICD-10-CM | POA: Diagnosis not present

## 2018-08-13 DIAGNOSIS — Z7901 Long term (current) use of anticoagulants: Secondary | ICD-10-CM

## 2018-08-13 DIAGNOSIS — B351 Tinea unguium: Secondary | ICD-10-CM | POA: Diagnosis not present

## 2018-08-13 MED ORDER — CICLOPIROX 8 % EX SOLN: Freq: Every day | CUTANEOUS | 2 refills | Status: DC

## 2018-08-13 NOTE — Progress Notes (Signed)
Subjective:   Patient ID: Randy Fuller, male   DOB: 85 y.o.   MRN: [id number]   HPI 85 year old male presents the office today for concerns of toenail fungus.  States the nails become thick making a longer trim them.  He discussed medications note on this and also asking for his nails be trimmed.  He is currently on allopurinol for gout no current flare.  He has no other concerns today.   Review of Systems  All other systems reviewed and are negative.  Past Medical History:  Diagnosis Date  . Anemia   . Arthritis   . Atrial fibrillation ([place])    on Coumadin but on hold for surgery bridging with lovenox  . Atrial fibrillation ([place])    also takes Digoxin daily  . Benign prostate hyperplasia   . Bilateral pneumonia   . Bruises easily    d/t being on Coumadin  . Cataract   . Colon polyps    2003  . Diverticulosis   . Dry skin    uses Kenalog cream as needed  . ED (erectile dysfunction)   . Elevated LFTs   . Enlarged prostate    takes Flomax daily  . Esophageal stricture   . Esophagitis   . Fatty liver   . GERD (gastroesophageal reflux disease)    takes Prilosec daily  . Glaucoma   . Gout    just stopped medication this week per Medical Md(was on Allopurinol)  . Hearing loss   . Hernia   . Hyperlipidemia    takes Atorvastatin every other day  . Impaired glucose tolerance   . Internal hemorrhoids   . Irregular heart beat   . Nocturia   . Vitamin D deficiency     Past Surgical History:  Procedure Laterality Date  . CATARACT EXTRACTION     Both eyes  . DOPPLER ECHOCARDIOGRAPHY  2003  . EYE SURGERY Left 2010   glaucoma  . EYE SURGERY Bilateral 2013   cataract extractions and lens implants  . HERNIA REPAIR Left 08/31/12   Left Inguinal Hernia Repair  . INGUINAL HERNIA REPAIR Left 08/31/2012   Procedure: LAPAROSCOPIC INGUINAL HERNIA;  Surgeon: Ralene Ok, MD;  Location: [facility];  Service: General;  Laterality: Left;  . INSERTION OF MESH Left 08/31/2012   Procedure: INSERTION OF MESH;  Surgeon: Ralene Ok, MD;  Location: [facility];  Service: General;  Laterality: Left;  . NM MYOVIEW LTD  2003     Current Outpatient Medications:  .  allopurinol (ZYLOPRIM) 300 MG tablet, Take 450 mg by mouth daily., Disp: , Rfl:  .  apixaban (ELIQUIS) 5 MG TABS tablet, Take 1 tablet (5 mg total) by mouth 2 (two) times daily., Disp: 180 tablet, Rfl: 0 .  Cholecalciferol 1000 UNITS capsule, Take 1,000 Units by mouth daily., Disp: , Rfl:  .  dorzolamide-timolol (COSOPT) 22.3-6.8 MG/ML ophthalmic solution, Place 1 drop into both eyes 2 (two) times daily. , Disp: , Rfl:  .  finasteride (PROSCAR) 5 MG tablet, Take 5 mg by mouth daily., Disp: , Rfl:  .  latanoprost (XALATAN) 0.005 % ophthalmic solution, Place 1 drop into both eyes daily., Disp: , Rfl:  .  losartan (COZAAR) 50 MG tablet, Take 1 tablet (50 mg total) by mouth daily., Disp: 90 tablet, Rfl: 3 .  Multiple Vitamins-Minerals (CENTRUM SILVER PO), Take 1 tablet by mouth daily. , Disp: , Rfl:  .  rosuvastatin (CRESTOR) 40 MG tablet, Take 1 tablet (40 mg total) by mouth  daily., Disp: 90 tablet, Rfl: 3 .  sildenafil (VIAGRA) 100 MG tablet, Take 100 mg by mouth as needed. , Disp: , Rfl:  .  tamsulosin (FLOMAX) 0.4 MG CAPS capsule, Take 2 capsules (0.8 mg total) by mouth daily., Disp: 30 capsule, Rfl: 0 .  triamcinolone ointment (KENALOG) 0.1 %, Apply 1 application topically 2 (two) times daily as needed (DRY SKIN). , Disp: , Rfl:  .  amLODipine (NORVASC) 2.5 MG tablet, Take 1 tablet (2.5 mg total) by mouth daily., Disp: 90 tablet, Rfl: 3 .  ciclopirox (PENLAC) 8 % solution, Apply topically at bedtime. Apply over nail and surrounding skin. Apply daily over previous coat. After seven (7) days, may remove with alcohol and continue cycle., Disp: 6.6 mL, Rfl: 2  Allergies  Allergen Reactions  . Pork-Derived Products Swelling         Objective:  Physical Exam  General: AAO x3, NAD  Dermatological: Nails are  hypertrophic, dystrophic, brittle, discolored, elongated 10. No surrounding redness or drainage. Tenderness nails 1-5 bilaterally. No open lesions or pre-ulcerative lesions are identified today.  Vascular: Dorsalis Pedis artery and Posterior Tibial artery pedal pulses are 2/4 bilateral with immedate capillary fill time. There is no pain with calf compression, swelling, warmth, erythema.   Neruologic: Grossly intact via light touch bilateral. VProtective threshold with Semmes Wienstein monofilament intact to all pedal sites bilateral.    Musculoskeletal: No gross boney pedal deformities bilateral. No pain, crepitus, or limitation noted with foot and ankle range of motion bilateral. Muscular strength 5/5 in all groups tested bilateral.  Gait: Unassisted, Nonantalgic.     Assessment:   85 year old male with symptomatic onychomycosis     Plan:  -Treatment options discussed including all alternatives, risks, and complications -Etiology of symptoms were discussed -Nails debrided 10 without complications or bleeding.  Discussed.  Treatment options for nail fungus he does want proceed with treatment.  Discussed Penlac and this was sent to pharmacy. Discussed side effects, success rates, application instructions. -Daily foot inspection -Continue allopurinol for gout  -Follow-up in 3 months or sooner if any problems arise. In the meantime, encouraged to call the office with any questions, concerns, change in symptoms.   Celesta Gentile, DPM

## 2018-08-13 NOTE — Telephone Encounter (Signed)
TC to patient. Covid-19 test is negative.

## 2018-08-14 DIAGNOSIS — I10 Essential (primary) hypertension: Secondary | ICD-10-CM | POA: Diagnosis not present

## 2018-08-14 DIAGNOSIS — U071 COVID-19: Secondary | ICD-10-CM | POA: Diagnosis not present

## 2018-08-14 DIAGNOSIS — K56609 Unspecified intestinal obstruction, unspecified as to partial versus complete obstruction: Secondary | ICD-10-CM | POA: Diagnosis not present

## 2018-08-14 DIAGNOSIS — Z20828 Contact with and (suspected) exposure to other viral communicable diseases: Secondary | ICD-10-CM | POA: Diagnosis not present

## 2018-08-17 ENCOUNTER — Telehealth (INDEPENDENT_AMBULATORY_CARE_PROVIDER_SITE_OTHER): Payer: Medicare HMO | Admitting: Internal Medicine

## 2018-08-17 ENCOUNTER — Other Ambulatory Visit: Payer: Self-pay

## 2018-08-17 ENCOUNTER — Encounter: Payer: Self-pay | Admitting: Internal Medicine

## 2018-08-17 VITALS — BP 132/83 | HR 53 | Ht 71.0 in | Wt 151.0 lb

## 2018-08-17 DIAGNOSIS — I482 Chronic atrial fibrillation, unspecified: Secondary | ICD-10-CM

## 2018-08-17 DIAGNOSIS — I951 Orthostatic hypotension: Secondary | ICD-10-CM

## 2018-08-17 DIAGNOSIS — R001 Bradycardia, unspecified: Secondary | ICD-10-CM

## 2018-08-17 NOTE — Progress Notes (Signed)
Pt reports his orthostatic Bps are as follows:  Sitting 138/92 HR 57  Standing after 3 minutes 135/78 HR 52  He reports no dizziness at this time.

## 2018-08-17 NOTE — Patient Instructions (Signed)
Called and discussed the following recommendations with patient:   Medication Instructions:  Your physician has recommended you make the following change in your medication:   Stop Metoprolol  Labwork: None ordered.   Testing/Procedures: None ordered.   Follow-Up: Your physician recommends that you schedule a follow-up appointment as needed with Dr Caryl Comes.    Any Other Special Instructions Will Be Listed Below (If Applicable).     If you need a refill on your cardiac medications before your next appointment, please call your pharmacy.

## 2018-08-17 NOTE — Progress Notes (Signed)
Electrophysiology TeleHealth Note   Due to national recommendations of social distancing due to COVID 19, an audio/video telehealth visit is felt to be most appropriate for this patient at this time.  See MyChart message from today for the patient's consent to telehealth for [facility].   Date:  08/17/2018   ID:  Randy Fuller, DOB 28-Feb-1934, MRN [id number]  Location: patient's home  Provider location: [address] [place]  Evaluation Performed: Initial Evaluation  PCP:  Derinda Late, MD  Cardiologist:  Quay Burow, MD  Electrophysiologist:  None   Chief Complaint:  Afib and slow VR  History of Present Illness:    Randy Fuller is a 85 y.o. male who presents via audio/video conferencing for a telehealth visit today for  Atrial fib with slow ventricular response   The patient did not have access to video technology/had technical difficulties with video requiring transitioning to audio format only (telephone).  All issues noted in this document were discussed and addressed.  No physical exam could be performed with this format.      His afib is permanent, dating back he thinks 10 years, and we have documentation to 2013.  He is anticoagulated with apixoban without significant bleeding and no palpitations  Over the last year with complaints of increasing fatigue, holter monitor 3/20  Holter> HR mean 50  33-108 and referred for evaluation.  Unbeknownst to cardiology at that time, he appears to have been on metoprolol, perhaps @ 50 mg daily.  This was downtitrated late April and during hospitalization early May HRs were in the 60's ( CV strips Personally reviewed )  He complains of orthostatic and exertional lightheadedness, no Ortho VS are on chart  Also with DOE but denies exertional CP, chronic R>L LE edema;   He is active in his garden, tilling away and planting greens and tomatoes and eggplant and ...   DATE TEST EF   4/19 MYOVIEW   %  Non-Ischemic  7/19 Echo 55-65%         3/20    Date Cr K Hgb  08/01/18  1.27 3.4>>3.6 14.6  08/04/18 1.08 2.9    08/10/18  4.5       The patient denies symptoms of fevers, chills, cough, or new SOB worrisome for COVID 19.   Past Medical History:  Diagnosis Date  . Anemia   . Arthritis   . Atrial fibrillation ([place])    on Coumadin but on hold for surgery bridging with lovenox  . Atrial fibrillation ([place])    also takes Digoxin daily  . Benign prostate hyperplasia   . Bilateral pneumonia   . Bruises easily    d/t being on Coumadin  . Cataract   . Colon polyps    2003  . Diverticulosis   . Dry skin    uses Kenalog cream as needed  . ED (erectile dysfunction)   . Elevated LFTs   . Enlarged prostate    takes Flomax daily  . Esophageal stricture   . Esophagitis   . Fatty liver   . GERD (gastroesophageal reflux disease)    takes Prilosec daily  . Glaucoma   . Gout    just stopped medication this week per Medical Md(was on Allopurinol)  . Hearing loss   . Hernia   . Hyperlipidemia    takes Atorvastatin every other day  . Impaired glucose tolerance   . Internal hemorrhoids   . Irregular heart beat   .  Nocturia   . Vitamin D deficiency     Past Surgical History:  Procedure Laterality Date  . CATARACT EXTRACTION     Both eyes  . DOPPLER ECHOCARDIOGRAPHY  2003  . EYE SURGERY Left 2010   glaucoma  . EYE SURGERY Bilateral 2013   cataract extractions and lens implants  . HERNIA REPAIR Left 08/31/12   Left Inguinal Hernia Repair  . INGUINAL HERNIA REPAIR Left 08/31/2012   Procedure: LAPAROSCOPIC INGUINAL HERNIA;  Surgeon: Ralene Ok, MD;  Location: [facility];  Service: General;  Laterality: Left;  . INSERTION OF MESH Left 08/31/2012   Procedure: INSERTION OF MESH;  Surgeon: Ralene Ok, MD;  Location: [facility];  Service: General;  Laterality: Left;  . NM MYOVIEW LTD  2003    Current Outpatient Medications  Medication Sig Dispense Refill  . allopurinol (ZYLOPRIM) 300  MG tablet Take 450 mg by mouth daily.    [name] apixaban (ELIQUIS) 5 MG TABS tablet Take 1 tablet (5 mg total) by mouth 2 (two) times daily. 180 tablet 0  . Cholecalciferol 1000 UNITS capsule Take 1,000 Units by mouth daily.    . ciclopirox (PENLAC) 8 % solution Apply topically at bedtime. Apply over nail and surrounding skin. Apply daily over previous coat. After seven (7) days, may remove with alcohol and continue cycle. 6.6 mL 2  . dorzolamide-timolol (COSOPT) 22.3-6.8 MG/ML ophthalmic solution Place 1 drop into both eyes 2 (two) times daily.     . finasteride (PROSCAR) 5 MG tablet Take 5 mg by mouth daily.    . fluocinonide cream (LIDEX) 1.76 % Apply 1 application topically 2 (two) times daily.    [name] latanoprost (XALATAN) 0.005 % ophthalmic solution Place 1 drop into both eyes daily.    [name] losartan (COZAAR) 50 MG tablet Take 1 tablet (50 mg total) by mouth daily. 90 tablet 3  . metoprolol succinate (TOPROL-XL) 25 MG 24 hr tablet Take 25 mg by mouth daily.    . Multiple Vitamins-Minerals (CENTRUM SILVER PO) Take 1 tablet by mouth daily.     . rosuvastatin (CRESTOR) 40 MG tablet Take 1 tablet (40 mg total) by mouth daily. 90 tablet 3  . sildenafil (VIAGRA) 100 MG tablet Take 100 mg by mouth as needed.     . tamsulosin (FLOMAX) 0.4 MG CAPS capsule Take 2 capsules (0.8 mg total) by mouth daily. 30 capsule 0  . triamcinolone ointment (KENALOG) 0.1 % Apply 1 application topically 2 (two) times daily as needed (DRY SKIN).      No current facility-administered medications for this visit.     Allergies:   Pork-derived products   Social History:  The patient  reports that he has never smoked. He has never used smokeless tobacco. He reports that he does not drink alcohol or use drugs.   Family History:  The patient'sfamily history includes Cancer in his sister and unknown relative; Cirrhosis in his brother; Heart disease in his brother and sister.   ROS:  Please see the history of present illness.   All  other systems are personally reviewed and negative.    Exam:    Vital Signs:  BP 132/83   Pulse (!) 53   Ht 5\' 11"  (1.803 m)   Wt 151 lb (68.5 kg)   BMI 21.06 kg/m        Labs/Other Tests and Data Reviewed:    Recent Labs: 07/30/2018: ALT 16 07/31/2018: Magnesium 2.1 08/03/2018: Hemoglobin 14.6; Platelets 145 08/04/2018: BUN 9; Creatinine, Ser 1.08;  Potassium 2.9; Sodium 139   Wt Readings from Last 3 Encounters:  08/17/18 151 lb (68.5 kg)  07/30/18 151 lb (68.5 kg)  06/17/18 155 lb (70.3 kg)     Other studies personally reviewed: Additional studies/ records that were reviewed today include:As above  Review of the above records today demonstrates: As above  Prior radiographs:       ASSESSMENT & PLAN:    Atrial Fib- permanent  CVR  Hypokalemia  Bradycardia by Holter  Hypertension  DOE  Orthostatic Lightheadedness   His AFib is permanent; anticoagulation with appropriate dosing and Hgb stable  Hypokalemia has been confirmed by PCP to have normalized  His bradycardia may have occurred w him having been using metoprolol which was downtitrated by Tommy Medal-- HRs in hospital were much more normal and no evidence of rapid HR though this may ensue with elimination of BB  See Below  At this point I dont see an indication for pacing-- if symptoms of abrupt as opposed to positional LH would use event monitor   His orthostatic symptoms are limiting  I have asked him today to do orthostatic VS at home, and will see what.[name]   His BP may need augmented Rx with discontnuation of BB;  KA was going to start amlodipine, but it never got filled.  May be a good option if necessary, but in the context of what we learn about his OI   COVID 19 screen The patient denies symptoms of COVID 19 at this time.  The importance of social distancing was discussed today.  Follow-up:  prn Next remote:  na  Current medicines are reviewed at length with the patient today.   The patient  does not have concerns regarding his medicines.  The following changes were made today:   Stop metoprolol  Do orthostatic VS today and report the results to LorrenS RN when she calls  Labs/ tests ordered today include: none No orders of the defined types were placed in this encounter.    Patient Risk:  after full review of this patients clinical status, I feel that they are at moderate risk at this time.  Today, I have spent 16* minutes with the patient with telehealth technology discussing As above  .    Signed, Virl Axe, MD  08/17/2018 9:23 AM     [address] [place] [address] [phone] (office) [phone] (fax)

## 2018-08-18 ENCOUNTER — Ambulatory Visit: Payer: Medicare HMO | Admitting: Cardiovascular Disease

## 2018-08-21 DIAGNOSIS — R351 Nocturia: Secondary | ICD-10-CM | POA: Diagnosis not present

## 2018-08-21 DIAGNOSIS — N401 Enlarged prostate with lower urinary tract symptoms: Secondary | ICD-10-CM | POA: Diagnosis not present

## 2018-08-21 DIAGNOSIS — R300 Dysuria: Secondary | ICD-10-CM | POA: Diagnosis not present

## 2018-08-21 DIAGNOSIS — R339 Retention of urine, unspecified: Secondary | ICD-10-CM | POA: Diagnosis not present

## 2018-09-03 ENCOUNTER — Telehealth: Payer: Self-pay | Admitting: Pharmacist Clinician (PhC)/ Clinical Pharmacy Specialist

## 2018-09-03 DIAGNOSIS — H401120 Primary open-angle glaucoma, left eye, stage unspecified: Secondary | ICD-10-CM | POA: Diagnosis not present

## 2018-09-03 DIAGNOSIS — H34831 Tributary (branch) retinal vein occlusion, right eye, with macular edema: Secondary | ICD-10-CM | POA: Diagnosis not present

## 2018-09-03 NOTE — Telephone Encounter (Signed)
Patient dropped of list of home BP readings this am.  For the last 17 days, his AM average was 122/77 and PM average 130/77.    LMOM for patient, advising him that all readings looked fine (1 outlier to 138 systolic) and that he no longer needs to send in home readings unless he has a specific concern

## 2018-10-05 DIAGNOSIS — Z125 Encounter for screening for malignant neoplasm of prostate: Secondary | ICD-10-CM | POA: Diagnosis not present

## 2018-10-12 DIAGNOSIS — I4891 Unspecified atrial fibrillation: Secondary | ICD-10-CM | POA: Diagnosis not present

## 2018-10-12 DIAGNOSIS — K219 Gastro-esophageal reflux disease without esophagitis: Secondary | ICD-10-CM | POA: Diagnosis not present

## 2018-10-12 DIAGNOSIS — D631 Anemia in chronic kidney disease: Secondary | ICD-10-CM | POA: Diagnosis not present

## 2018-10-12 DIAGNOSIS — D649 Anemia, unspecified: Secondary | ICD-10-CM | POA: Diagnosis not present

## 2018-10-12 DIAGNOSIS — N183 Chronic kidney disease, stage 3 (moderate): Secondary | ICD-10-CM | POA: Diagnosis not present

## 2018-10-12 DIAGNOSIS — M19072 Primary osteoarthritis, left ankle and foot: Secondary | ICD-10-CM | POA: Diagnosis not present

## 2018-10-12 DIAGNOSIS — Z0000 Encounter for general adult medical examination without abnormal findings: Secondary | ICD-10-CM | POA: Diagnosis not present

## 2018-10-12 DIAGNOSIS — R7302 Impaired glucose tolerance (oral): Secondary | ICD-10-CM | POA: Diagnosis not present

## 2018-10-12 DIAGNOSIS — E7800 Pure hypercholesterolemia, unspecified: Secondary | ICD-10-CM | POA: Diagnosis not present

## 2018-10-12 DIAGNOSIS — I129 Hypertensive chronic kidney disease with stage 1 through stage 4 chronic kidney disease, or unspecified chronic kidney disease: Secondary | ICD-10-CM | POA: Diagnosis not present

## 2018-11-05 DIAGNOSIS — H34831 Tributary (branch) retinal vein occlusion, right eye, with macular edema: Secondary | ICD-10-CM | POA: Diagnosis not present

## 2018-11-10 ENCOUNTER — Other Ambulatory Visit: Payer: Self-pay | Admitting: Cardiovascular Disease

## 2018-11-10 NOTE — Telephone Encounter (Signed)
7m 70.3kg Scr 1.2 08/03/18 Lovw/klein 08/17/18

## 2018-11-10 NOTE — Telephone Encounter (Signed)
Refill request

## 2018-11-13 ENCOUNTER — Encounter: Payer: Self-pay | Admitting: Podiatry

## 2018-11-13 ENCOUNTER — Ambulatory Visit (INDEPENDENT_AMBULATORY_CARE_PROVIDER_SITE_OTHER): Payer: Medicare HMO | Admitting: Podiatry

## 2018-11-13 ENCOUNTER — Other Ambulatory Visit: Payer: Self-pay

## 2018-11-13 DIAGNOSIS — M79674 Pain in right toe(s): Secondary | ICD-10-CM | POA: Diagnosis not present

## 2018-11-13 DIAGNOSIS — M79675 Pain in left toe(s): Secondary | ICD-10-CM | POA: Diagnosis not present

## 2018-11-13 DIAGNOSIS — B351 Tinea unguium: Secondary | ICD-10-CM

## 2018-11-13 DIAGNOSIS — Z7901 Long term (current) use of anticoagulants: Secondary | ICD-10-CM

## 2018-11-13 NOTE — Patient Instructions (Signed)

## 2018-11-16 DIAGNOSIS — E039 Hypothyroidism, unspecified: Secondary | ICD-10-CM | POA: Diagnosis not present

## 2018-11-24 NOTE — Progress Notes (Signed)
Subjective:  Randy Fuller presents to clinic today with cc of  painful, thick, discolored, elongated toenails 1-5 b/l that become tender and cannot cut because of thickness.  Pain is aggravated when wearing enclosed shoe gear.  Derinda Late, MD is his PCP.   Current Outpatient Medications:  .  allopurinol (ZYLOPRIM) 300 MG tablet, Take 450 mg by mouth daily., Disp: , Rfl:  .  Cholecalciferol 1000 UNITS capsule, Take 1,000 Units by mouth daily., Disp: , Rfl:  .  ciclopirox (PENLAC) 8 % solution, Apply topically at bedtime. Apply over nail and surrounding skin. Apply daily over previous coat. After seven (7) days, may remove with alcohol and continue cycle., Disp: 6.6 mL, Rfl: 2 .  dorzolamide-timolol (COSOPT) 22.3-6.8 MG/ML ophthalmic solution, Place 1 drop into both eyes 2 (two) times daily. , Disp: , Rfl:  .  ELIQUIS 5 MG TABS tablet, TAKE 1 TABLET TWICE A DAY, Disp: 180 tablet, Rfl: 1 .  finasteride (PROSCAR) 5 MG tablet, Take 5 mg by mouth daily., Disp: , Rfl:  .  fluocinonide cream (LIDEX) AB-123456789 %, Apply 1 application topically 2 (two) times daily., Disp: , Rfl:  .  latanoprost (XALATAN) 0.005 % ophthalmic solution, Place 1 drop into both eyes daily., Disp: , Rfl:  .  levothyroxine (SYNTHROID) 100 MCG tablet, TAKE 1 TABLET BY MOUTH IN THE MORNING 30 MINS TO 1 HOUR BEFORE FOOD, Disp: , Rfl:  .  losartan (COZAAR) 50 MG tablet, Take 1 tablet (50 mg total) by mouth daily., Disp: 90 tablet, Rfl: 3 .  Multiple Vitamins-Minerals (CENTRUM SILVER PO), Take 1 tablet by mouth daily. , Disp: , Rfl:  .  rosuvastatin (CRESTOR) 40 MG tablet, Take 1 tablet (40 mg total) by mouth daily., Disp: 90 tablet, Rfl: 3 .  sildenafil (VIAGRA) 100 MG tablet, Take 100 mg by mouth as needed. , Disp: , Rfl:  .  tamsulosin (FLOMAX) 0.4 MG CAPS capsule, Take 2 capsules (0.8 mg total) by mouth daily., Disp: 30 capsule, Rfl: 0 .  triamcinolone ointment (KENALOG) 0.1 %, Apply 1 application topically 2 (two) times  daily as needed (DRY SKIN). , Disp: , Rfl:    Allergies  Allergen Reactions  . Pork-Derived Products Swelling     Objective:  Physical Examination:  Vascular Examination: Capillary refill time immediate x 10 digits.  Palpable DP/PT pulses b/l.  Digital hair absent bilaterally.  No pain with calf compression noted bilaterally.  No edema noted b/l.  Skin temperature gradient WNL b/l.  Dermatological Examination: Skin with normal turgor, texture and tone b/l.  No open wounds b/l.  No interdigital macerations noted b/l.  Elongated, thick, discolored brittle toenails with subungual debris and pain on dorsal palpation of nailbeds 1-5 b/l.  Musculoskeletal Examination: Muscle strength 5/5 to all muscle groups b/l  No pain, crepitus or joint discomfort with active/passive ROM.  Neurological Examination: Sensation intact 5/5 b/l with 10 gram monofilament.  Vibratory sensation intact b/l.  Proprioceptive sensation intact b/l.  Assessment: Mycotic nail infection with pain 1-5 b/l  Plan: 1. Toenails 1-5 b/l were debrided in length and girth without iatrogenic laceration. 2.  Continue soft, supportive shoe gear daily. 3.  Report any pedal injuries to medical professional. 4.  Follow up 3 months. 5.  Patient/POA to call should there be a question/concern in there interim.

## 2018-12-15 ENCOUNTER — Telehealth: Payer: Self-pay

## 2018-12-15 ENCOUNTER — Encounter: Payer: Self-pay | Admitting: Cardiovascular Disease

## 2018-12-15 ENCOUNTER — Other Ambulatory Visit: Payer: Self-pay

## 2018-12-15 ENCOUNTER — Ambulatory Visit (INDEPENDENT_AMBULATORY_CARE_PROVIDER_SITE_OTHER): Payer: Medicare HMO | Admitting: Cardiovascular Disease

## 2018-12-15 DIAGNOSIS — I482 Chronic atrial fibrillation, unspecified: Secondary | ICD-10-CM

## 2018-12-15 DIAGNOSIS — E782 Mixed hyperlipidemia: Secondary | ICD-10-CM | POA: Diagnosis not present

## 2018-12-15 MED ORDER — APIXABAN 5 MG PO TABS: 5.0000 mg | ORAL_TABLET | Freq: Two times a day (BID) | ORAL | 1 refills | Status: DC

## 2018-12-15 MED ORDER — ROSUVASTATIN CALCIUM 40 MG PO TABS: 40.0000 mg | ORAL_TABLET | Freq: Every day | ORAL | 3 refills | Status: AC

## 2018-12-15 MED ORDER — LOSARTAN POTASSIUM 50 MG PO TABS: 50.0000 mg | ORAL_TABLET | Freq: Every day | ORAL | 3 refills | Status: AC

## 2018-12-15 NOTE — Progress Notes (Signed)
12/15/2018 Randy Fuller   11-24-1933  [id number]  Primary Physician Derinda Late, MD Primary Cardiologist: Lorretta Harp MD [name], [place]  HPI:  Randy Fuller is a 85 y.o.  thin-appearing, married [place] American male, father of 74, grandfather to 17 grandchildren who I sawin the office  06/17/2018.[name] He has a history of paroxysmal A-fib in the past, now chronic A-fib onEliquis oralanticoagulation. His other problems include hyperlipidemia. He is entirely asymptomatic. Dr. Sandi Mariscal follows his lipid profile.hisCrestorwas increased from 20-40 mg a day by Dr. Sandi Mariscal  Since I saw him back one year ago he denies chest pain or shortness of breath.He did have a Myoview ordered by Dr. Georgena Spurling 07/08/2017 which was nonischemic and low risk. A 2D echocardiogram performed 10/10/2017 showed normal LV size and function with a speckled appearance in his myocardium suggestive of amyloid however he is completely asymptomatic and therefore I will not pursue a pyrophosphate scan or cardiac MRI.  He saw Kerin Ransom, PA-C in the office 06/18/2018 was sent complaints of fatigue.  His heart rate at that time was 38.  An event monitor showed A. fib with a slow ventricular response in the 30s and 40s on occasion.  He denies syncope or presyncope.  I referred him to Dr. Caryl Comes who evaluated him in the office on 08/17/2018 did not think he was a candidate for permanent transvenous pacing.  Since I saw him 6 months ago he is remained stable denying chest pain, shortness of breath dizziness or presyncope.   Current Meds  Medication Sig  . allopurinol (ZYLOPRIM) 300 MG tablet Take 450 mg by mouth daily.  . Cholecalciferol 1000 UNITS capsule Take 1,000 Units by mouth daily.  . ciclopirox (PENLAC) 8 % solution Apply topically at bedtime. Apply over nail and surrounding skin. Apply daily over previous coat. After seven (7) days, may remove with alcohol and continue cycle.  .  dorzolamide-timolol (COSOPT) 22.3-6.8 MG/ML ophthalmic solution Place 1 drop into both eyes 2 (two) times daily.   [name] ELIQUIS 5 MG TABS tablet TAKE 1 TABLET TWICE A DAY  . finasteride (PROSCAR) 5 MG tablet Take 5 mg by mouth daily.  . fluocinonide cream (LIDEX) AB-123456789 % Apply 1 application topically 2 (two) times daily.  [name] latanoprost (XALATAN) 0.005 % ophthalmic solution Place 1 drop into both eyes daily.  [name] levothyroxine (SYNTHROID) 100 MCG tablet TAKE 1 TABLET BY MOUTH IN THE MORNING 30 MINS TO 1 HOUR BEFORE FOOD  . losartan (COZAAR) 50 MG tablet Take 1 tablet (50 mg total) by mouth daily.  . Multiple Vitamins-Minerals (CENTRUM SILVER PO) Take 1 tablet by mouth daily.   . rosuvastatin (CRESTOR) 40 MG tablet Take 1 tablet (40 mg total) by mouth daily.  . sildenafil (VIAGRA) 100 MG tablet Take 100 mg by mouth as needed.   . tamsulosin (FLOMAX) 0.4 MG CAPS capsule Take 2 capsules (0.8 mg total) by mouth daily.  [name] triamcinolone ointment (KENALOG) 0.1 % Apply 1 application topically 2 (two) times daily as needed (DRY SKIN).      Allergies  Allergen Reactions  . Pork-Derived Products Swelling    Social History   Socioeconomic History  . Marital status: Married    Spouse name: Not on file  . Number of children: Not on file  . Years of education: Not on file  . Highest education level: Not on file  Occupational History  . 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  Financial resource strain: Not on file  . Food insecurity    Worry: Not on file    Inability: Not on file  . Transportation needs    Medical: Not on file    Non-medical: Not on file  Tobacco Use  . Smoking status: Never Smoker  . Smokeless tobacco: Never Used  Substance and Sexual Activity  . Alcohol use: No  . Drug use: No  . Sexual activity: Not on file  Lifestyle  . Physical activity    Days per week: Not on file    Minutes per session: Not on file  . Stress: Not on file  Relationships   . Social Herbalist on phone: Not on file    Gets together: Not on file    Attends religious service: Not on file    Active member of club or organization: Not on file    Attends meetings of clubs or organizations: Not on file    Relationship status: Not on file  . Intimate partner violence    Fear of current or ex partner: Not on file    Emotionally abused: Not on file    Physically abused: Not on file    Forced sexual activity: Not on file  Other Topics Concern  . Not on file  Social History Narrative  . Not on file     Review of Systems: General: negative for chills, fever, night sweats or weight changes.  Cardiovascular: negative for chest pain, dyspnea on exertion, edema, orthopnea, palpitations, paroxysmal nocturnal dyspnea or shortness of breath Dermatological: negative for rash Respiratory: negative for cough or wheezing Urologic: negative for hematuria Abdominal: negative for nausea, vomiting, diarrhea, bright red blood per rectum, melena, or hematemesis Neurologic: negative for visual changes, syncope, or dizziness All other systems reviewed and are otherwise negative except as noted above.    Blood pressure (!) 128/58, pulse (!) 58, temperature (!) 97.2 F (36.2 C), height 5\' 11"  (1.803 m), weight 148 lb (67.1 kg).  General appearance: alert and no distress Neck: no adenopathy, no carotid bruit, no JVD, supple, symmetrical, trachea midline and thyroid not enlarged, symmetric, no tenderness/mass/nodules Lungs: clear to auscultation bilaterally Heart: irregularly irregular rhythm Extremities: extremities normal, atraumatic, no cyanosis or edema Pulses: 2+ and symmetric Skin: Skin color, texture, turgor normal. No rashes or lesions Neurologic: Alert and oriented X 3, normal strength and tone. Normal symmetric reflexes. Normal coordination and gait  EKG not performed today  ASSESSMENT AND PLAN:   Atrial fibrillation, chronic History of chronic atrial  fibrillation on Eliquis oral anticoagulation with slow ventricular response in the past seen on event monitoring.  He was referred to Dr. Caryl Comes for consideration of pacemaker insertion for symptomatic bradycardia however he did not think that he was a candidate for that.  His heart rates have been otherwise well controlled and he has been asymptomatic  Hyperlipidemia History of hyperlipidemia on statin therapy followed by his PCP      Lorretta Harp MD [facility], [facility] 12/15/2018 9:11 AM

## 2018-12-15 NOTE — Patient Instructions (Signed)

## 2018-12-15 NOTE — Assessment & Plan Note (Signed)
History of hyperlipidemia on statin therapy followed by his PCP 

## 2018-12-15 NOTE — Assessment & Plan Note (Signed)
History of chronic atrial fibrillation on Eliquis oral anticoagulation with slow ventricular response in the past seen on event monitoring.  He was referred to Dr. Caryl Comes for consideration of pacemaker insertion for symptomatic bradycardia however he did not think that he was a candidate for that.  His heart rates have been otherwise well controlled and he has been asymptomatic

## 2018-12-15 NOTE — Telephone Encounter (Signed)
29m 67.1kg Scr 1.08 08/04/18  Lovw/berry 12/15/18

## 2018-12-31 DIAGNOSIS — H34831 Tributary (branch) retinal vein occlusion, right eye, with macular edema: Secondary | ICD-10-CM | POA: Diagnosis not present

## 2018-12-31 DIAGNOSIS — E039 Hypothyroidism, unspecified: Secondary | ICD-10-CM | POA: Diagnosis not present

## 2019-01-06 DIAGNOSIS — Z23 Encounter for immunization: Secondary | ICD-10-CM | POA: Diagnosis not present

## 2019-02-12 ENCOUNTER — Ambulatory Visit (INDEPENDENT_AMBULATORY_CARE_PROVIDER_SITE_OTHER): Payer: Medicare HMO | Admitting: Podiatry

## 2019-02-12 ENCOUNTER — Other Ambulatory Visit: Payer: Self-pay

## 2019-02-12 ENCOUNTER — Encounter: Payer: Self-pay | Admitting: Podiatry

## 2019-02-12 DIAGNOSIS — M79674 Pain in right toe(s): Secondary | ICD-10-CM

## 2019-02-12 DIAGNOSIS — B351 Tinea unguium: Secondary | ICD-10-CM

## 2019-02-12 DIAGNOSIS — M79675 Pain in left toe(s): Secondary | ICD-10-CM

## 2019-02-18 DIAGNOSIS — H34831 Tributary (branch) retinal vein occlusion, right eye, with macular edema: Secondary | ICD-10-CM | POA: Diagnosis not present

## 2019-02-18 NOTE — Progress Notes (Signed)
Subjective: Randy Fuller is seen today for follow up painful, elongated, thickened toenails 1-5 b/l feet that he cannot cut. Pain interferes with daily activities. Aggravating factor includes wearing enclosed shoe gear and relieved with periodic debridement.  He is also on blood thinner, Eliquis.  He voices no new pedal problems on today's visit.  Current Outpatient Medications on File Prior to Visit  Medication Sig  . allopurinol (ZYLOPRIM) 300 MG tablet Take 450 mg by mouth daily.  [name] apixaban (ELIQUIS) 5 MG TABS tablet Take 1 tablet (5 mg total) by mouth 2 (two) times daily.  . Cholecalciferol 1000 UNITS capsule Take 1,000 Units by mouth daily.  . ciclopirox (PENLAC) 8 % solution Apply topically at bedtime. Apply over nail and surrounding skin. Apply daily over previous coat. After seven (7) days, may remove with alcohol and continue cycle.  . dorzolamide-timolol (COSOPT) 22.3-6.8 MG/ML ophthalmic solution Place 1 drop into both eyes 2 (two) times daily.   . finasteride (PROSCAR) 5 MG tablet Take 5 mg by mouth daily.  . fluocinonide cream (LIDEX) AB-123456789 % Apply 1 application topically 2 (two) times daily.  [name] latanoprost (XALATAN) 0.005 % ophthalmic solution Place 1 drop into both eyes daily.  [name] levothyroxine (SYNTHROID) 100 MCG tablet TAKE 1 TABLET BY MOUTH IN THE MORNING 30 MINS TO 1 HOUR BEFORE FOOD  . levothyroxine (SYNTHROID) 75 MCG tablet Take 75 mcg by mouth daily.  [name] losartan (COZAAR) 50 MG tablet Take 1 tablet (50 mg total) by mouth daily.  . Multiple Vitamins-Minerals (CENTRUM SILVER PO) Take 1 tablet by mouth daily.   . rosuvastatin (CRESTOR) 40 MG tablet Take 1 tablet (40 mg total) by mouth daily.  . sildenafil (VIAGRA) 100 MG tablet Take 100 mg by mouth as needed.   . tamsulosin (FLOMAX) 0.4 MG CAPS capsule Take 2 capsules (0.8 mg total) by mouth daily.  [name] triamcinolone ointment (KENALOG) 0.1 % Apply 1 application topically 2 (two) times daily as needed (DRY SKIN).    No  current facility-administered medications on file prior to visit.      Allergies  Allergen Reactions  . Pork-Derived Products Swelling    Objective:  Vascular Examination: Capillary refill time immediate x 10 digits.  Dorsalis pedis and posterior tibial pulses present b/l.  Digital hair absent b/l.  Skin temperature gradient WNL b/l.   Dermatological Examination: Skin with normal turgor, texture and tone b/l.  Toenails 1-5 b/l discolored, thick, dystrophic with subungual debris and pain with palpation to nailbeds due to thickness of nails.  Musculoskeletal: Muscle strength 5/5 b/l to all LE muscle groups.  No pain, crepitus or joint limitation noted with ROM.   Neurological Examination: Protective sensation intact with 10 gram monofilament bilaterally.  Assessment: Painful onychomycosis toenails 1-5 b/l   Plan: 1. Toenails 1-5 b/l were debrided in length and girth without iatrogenic bleeding. 2. Patient to continue soft, supportive shoe gear. 3. Patient to report any pedal injuries to medical professional immediately. 4. Follow up 3 months.  5. Patient/POA to call should there be a concern in the interim.

## 2019-02-23 DIAGNOSIS — E039 Hypothyroidism, unspecified: Secondary | ICD-10-CM | POA: Diagnosis not present

## 2019-05-14 ENCOUNTER — Other Ambulatory Visit: Payer: Self-pay

## 2019-05-14 ENCOUNTER — Encounter: Payer: Self-pay | Admitting: Podiatry

## 2019-05-14 ENCOUNTER — Ambulatory Visit (INDEPENDENT_AMBULATORY_CARE_PROVIDER_SITE_OTHER): Payer: Medicare HMO | Admitting: Podiatry

## 2019-05-14 DIAGNOSIS — M79675 Pain in left toe(s): Secondary | ICD-10-CM

## 2019-05-14 DIAGNOSIS — L84 Corns and callosities: Secondary | ICD-10-CM

## 2019-05-14 DIAGNOSIS — M79674 Pain in right toe(s): Secondary | ICD-10-CM | POA: Diagnosis not present

## 2019-05-14 DIAGNOSIS — N189 Chronic kidney disease, unspecified: Secondary | ICD-10-CM | POA: Diagnosis not present

## 2019-05-14 DIAGNOSIS — D631 Anemia in chronic kidney disease: Secondary | ICD-10-CM | POA: Diagnosis not present

## 2019-05-14 DIAGNOSIS — B351 Tinea unguium: Secondary | ICD-10-CM | POA: Diagnosis not present

## 2019-05-14 NOTE — Patient Instructions (Signed)

## 2019-05-16 NOTE — Progress Notes (Signed)
Subjective: Randy Fuller presents today for follow up of painful mycotic nails b/l that are difficult to trim. Pain interferes with ambulation. Aggravating factors include wearing enclosed shoe gear. Pain is relieved with periodic professional debridement.   Allergies  Allergen Reactions  . Pork-Derived Products Swelling     Objective: There were no vitals filed for this visit.  Vascular Examination:  Capillary refill time to digits immediate b/l, palpable DP pulses b/l, palpable PT pulses b/l, pedal hair absent b/l and skin temperature gradient within normal limits b/l  Dermatological Examination: Pedal skin with normal turgor, texture and tone bilaterally, no open wounds bilaterally, no interdigital macerations bilaterally, toenails 1-5 b/l elongated, dystrophic, thickened, crumbly with subungual debris and hyperkeratotic lesion(s) submet head 5 b/l and submet head 1 b/l.  No erythema, no edema, no drainage, no flocculence  Musculoskeletal: Normal muscle strength 5/5 to all lower extremity muscle groups bilaterally, no pain crepitus or joint limitation noted with ROM b/l and bunion deformity noted b/l  Neurological: Protective sensation intact 5/5 intact bilaterally with 10g monofilament b/l and vibratory sensation intact b/l  Assessment: 1. Pain due to onychomycosis of toenails of both feet   2. Callus   3. Anemia due to chronic kidney disease, unspecified CKD stage    Plan: -Toenails 1-5 b/l were debrided in length and girth without iatrogenic bleeding. -calluses were debrided without complication or incident. Total number debrided =4, submet head 1 b/l and submet head 5 b/l -Patient to continue soft, supportive shoe gear daily. -Patient to report any pedal injuries to medical professional immediately. -Patient/POA to call should there be question/concern in the interim.  Return in about 3 months (around 08/11/2019) for nail trim/ Xarelto.

## 2019-07-25 ENCOUNTER — Other Ambulatory Visit: Payer: Self-pay | Admitting: Internal Medicine

## 2019-07-26 NOTE — Telephone Encounter (Signed)
Last OV 12/15/18 Scr 1.08 on 08/04/18 86 years old 67kg Eliquis 5mg  BID sent to pharmacy

## 2019-08-11 ENCOUNTER — Ambulatory Visit: Payer: Medicare HMO | Admitting: Podiatry

## 2019-08-13 ENCOUNTER — Encounter: Payer: Self-pay | Admitting: Podiatry

## 2019-08-13 ENCOUNTER — Ambulatory Visit (INDEPENDENT_AMBULATORY_CARE_PROVIDER_SITE_OTHER): Payer: Medicare HMO | Admitting: Podiatry

## 2019-08-13 ENCOUNTER — Other Ambulatory Visit: Payer: Self-pay

## 2019-08-13 DIAGNOSIS — D631 Anemia in chronic kidney disease: Secondary | ICD-10-CM | POA: Diagnosis not present

## 2019-08-13 DIAGNOSIS — M79674 Pain in right toe(s): Secondary | ICD-10-CM

## 2019-08-13 DIAGNOSIS — M79675 Pain in left toe(s): Secondary | ICD-10-CM | POA: Diagnosis not present

## 2019-08-13 DIAGNOSIS — N189 Chronic kidney disease, unspecified: Secondary | ICD-10-CM | POA: Diagnosis not present

## 2019-08-13 DIAGNOSIS — B351 Tinea unguium: Secondary | ICD-10-CM

## 2019-08-13 DIAGNOSIS — L84 Corns and callosities: Secondary | ICD-10-CM

## 2019-08-13 NOTE — Patient Instructions (Signed)

## 2019-08-21 NOTE — Progress Notes (Signed)
Subjective: Randy Fuller is a 86 y.o. male patient seen today painful mycotic nails b/l that are difficult to trim. Pain interferes with ambulation. Aggravating factors include wearing enclosed shoe gear. Pain is relieved with periodic professional debridement  Patient Active Problem List   Diagnosis Date Noted  . Chronic atrial fibrillation ([place])   . Small bowel obstruction ([place]) 07/30/2018  . Abnormal echocardiogram 05/20/2018  . Vitamin D deficiency   . Nocturia   . Internal hemorrhoids   . Impaired glucose tolerance   . Hyperlipidemia   . Hearing loss   . Gout   . Glaucoma   . GERD (gastroesophageal reflux disease)   . Fatty liver   . Esophagitis   . Enlarged prostate   . Elevated LFTs   . ED (erectile dysfunction)   . Dry skin   . Diverticulosis   . Colon polyps   . Cataract   . Bruises easily   . Bilateral pneumonia   . Benign prostate hyperplasia   . Arthritis   . Anemia   . Atrial fibrillation, chronic 10/09/2016  . Vitreous syneresis of both eyes 08/22/2016  . Esophageal stricture   . Macular edema 10/08/2012  . Chronic anticoagulation 08/13/2012  . Branch retinal vein occlusion with macular edema of right eye 06/01/2012  . Primary open angle glaucoma (POAG) 03/01/2011  . Benign neoplasm of colon 06/21/2003    Current Outpatient Medications on File Prior to Visit  Medication Sig Dispense Refill  . allopurinol (ZYLOPRIM) 300 MG tablet Take 450 mg by mouth daily.    . Cholecalciferol 1000 UNITS capsule Take 1,000 Units by mouth daily.    . ciclopirox (PENLAC) 8 % solution Apply topically at bedtime. Apply over nail and surrounding skin. Apply daily over previous coat. After seven (7) days, may remove with alcohol and continue cycle. 6.6 mL 2  . dorzolamide-timolol (COSOPT) 22.3-6.8 MG/ML ophthalmic solution Place 1 drop into both eyes 2 (two) times daily.     [name] ELIQUIS 5 MG TABS tablet TAKE 1 TABLET TWICE A DAY 180 tablet 1  . finasteride (PROSCAR) 5 MG  tablet Take 5 mg by mouth daily.    . fluocinonide cream (LIDEX) [id number] % Apply 1 application topically 2 (two) times daily.    [name] latanoprost (XALATAN) 0.005 % ophthalmic solution Place 1 drop into both eyes daily.    [name] levothyroxine (SYNTHROID) 100 MCG tablet TAKE 1 TABLET BY MOUTH IN THE MORNING 30 MINS TO 1 HOUR BEFORE FOOD    . levothyroxine (SYNTHROID) 75 MCG tablet Take 75 mcg by mouth daily.    [name] losartan (COZAAR) 50 MG tablet Take 1 tablet (50 mg total) by mouth daily. 90 tablet 3  . Multiple Vitamins-Minerals (CENTRUM SILVER PO) Take 1 tablet by mouth daily.     . rosuvastatin (CRESTOR) 40 MG tablet Take 1 tablet (40 mg total) by mouth daily. 90 tablet 3  . sildenafil (VIAGRA) 100 MG tablet Take 100 mg by mouth as needed.     . tamsulosin (FLOMAX) 0.4 MG CAPS capsule Take 2 capsules (0.8 mg total) by mouth daily. 30 capsule 0  . triamcinolone ointment (KENALOG) 0.1 % Apply 1 application topically 2 (two) times daily as needed (DRY SKIN).      No current facility-administered medications on file prior to visit.    Allergies  Allergen Reactions  . Pork-Derived Products Swelling    Objective: Physical Exam  General: 86 y.o. AA male no acute distress. AAO x 3.  Neurovascular  status unchanged b/l.  Capillary refill time to digits immediate b/l. Palpable DP pulses b/l. Palpable PT pulses b/l. Pedal hair present b/l. Skin temperature gradient within normal limits b/l. No edema noted b/l.  Protective sensation intact 5/5 intact bilaterally with 10g monofilament b/l. Vibratory sensation intact b/l. Proprioception intact bilaterally. Babinski reflex negative b/l. Clonus negative b/l.  Dermatological:  Pedal skin with normal turgor, texture and tone bilaterally. No open wounds bilaterally. No interdigital macerations bilaterally. Toenails 1-5 b/l elongated, dystrophic, thickened, crumbly with subungual debris and tenderness to dorsal palpation. Hyperkeratotic lesion(s) submet head 1 left  foot, submet head 1 right foot, submet head 5 left foot and submet head 5 right foot.  No erythema, no edema, no drainage, no flocculence.  Musculoskeletal:  Normal muscle strength 5/5 to all lower extremity muscle groups bilaterally. No pain crepitus or joint limitation noted with ROM b/l. Hallux valgus with bunion deformity noted b/l.  Assessment and Plan:  1. Pain due to onychomycosis of toenails of both feet   2. Callus   3. Anemia due to chronic kidney disease, unspecified CKD stage    -Examined patient. -No new findings. No new orders. -Toenails 1-5 b/l were debrided in length and girth with sterile nail nippers and dremel without iatrogenic bleeding.  -Callus(es) submet head 1 left foot, submet head 1 right foot, submet head 5 left foot and submet head 5 right foot pared utilizing sterile scalpel blade without complication or incident. Total number debrided =4. -Patient to continue soft, supportive shoe gear daily. -Patient to report any pedal injuries to medical professional immediately. -Patient/POA to call should there be question/concern in the interim.  Return in about 3 months (around 11/13/2019) for nail and callus trim/ Eliquis.  Marzetta Board, DPM

## 2019-10-13 ENCOUNTER — Ambulatory Visit
Admission: RE | Admit: 2019-10-13 | Discharge: 2019-10-13 | Disposition: A | Discharge status: Home / Self Care | Payer: Medicare HMO | Source: Ambulatory Visit | Attending: Family Medicine | Admitting: Family Medicine

## 2019-10-13 ENCOUNTER — Other Ambulatory Visit: Payer: Self-pay | Admitting: Family Medicine

## 2019-10-13 ENCOUNTER — Other Ambulatory Visit: Payer: Self-pay

## 2019-10-13 DIAGNOSIS — M545 Low back pain, unspecified: Secondary | ICD-10-CM

## 2019-10-13 DIAGNOSIS — M25552 Pain in left hip: Secondary | ICD-10-CM

## 2019-11-12 ENCOUNTER — Other Ambulatory Visit: Payer: Self-pay

## 2019-11-12 ENCOUNTER — Encounter: Payer: Self-pay | Admitting: Podiatry

## 2019-11-12 ENCOUNTER — Ambulatory Visit (INDEPENDENT_AMBULATORY_CARE_PROVIDER_SITE_OTHER): Payer: Medicare HMO | Admitting: Podiatry

## 2019-11-12 DIAGNOSIS — M79675 Pain in left toe(s): Secondary | ICD-10-CM | POA: Diagnosis not present

## 2019-11-12 DIAGNOSIS — L84 Corns and callosities: Secondary | ICD-10-CM | POA: Diagnosis not present

## 2019-11-12 DIAGNOSIS — B351 Tinea unguium: Secondary | ICD-10-CM

## 2019-11-12 DIAGNOSIS — M79674 Pain in right toe(s): Secondary | ICD-10-CM

## 2019-11-12 DIAGNOSIS — N189 Chronic kidney disease, unspecified: Secondary | ICD-10-CM | POA: Diagnosis not present

## 2019-11-12 DIAGNOSIS — D631 Anemia in chronic kidney disease: Secondary | ICD-10-CM | POA: Diagnosis not present

## 2019-11-13 NOTE — Progress Notes (Signed)
Subjective: Randy Fuller is a 86 y.o. male patient seen today painful mycotic nails b/l that are difficult to trim. Pain interferes with ambulation. Aggravating factors include wearing enclosed shoe gear. Pain is relieved with periodic professional debridement   He voices no new pedal concerns on today's visit.  His PCP is Dr. Derinda Late. Last visit was 04/07/2018.  Patient Active Problem List   Diagnosis Date Noted  . Chronic atrial fibrillation ([place])   . Small bowel obstruction ([place]) 07/30/2018  . Abnormal echocardiogram 05/20/2018  . Vitamin D deficiency   . Nocturia   . Internal hemorrhoids   . Impaired glucose tolerance   . Hyperlipidemia   . Hearing loss   . Gout   . Glaucoma   . GERD (gastroesophageal reflux disease)   . Fatty liver   . Esophagitis   . Enlarged prostate   . Elevated LFTs   . ED (erectile dysfunction)   . Dry skin   . Diverticulosis   . Colon polyps   . Cataract   . Bruises easily   . Bilateral pneumonia   . Benign prostate hyperplasia   . Arthritis   . Anemia   . Atrial fibrillation, chronic 10/09/2016  . Vitreous syneresis of both eyes 08/22/2016  . Esophageal stricture   . Macular edema 10/08/2012  . Chronic anticoagulation 08/13/2012  . Branch retinal vein occlusion with macular edema of right eye 06/01/2012  . Primary open angle glaucoma (POAG) 03/01/2011  . Benign neoplasm of colon 06/21/2003    Current Outpatient Medications on File Prior to Visit  Medication Sig Dispense Refill  . allopurinol (ZYLOPRIM) 300 MG tablet Take 450 mg by mouth daily.    . Cholecalciferol 1000 UNITS capsule Take 1,000 Units by mouth daily.    . ciclopirox (PENLAC) 8 % solution Apply topically at bedtime. Apply over nail and surrounding skin. Apply daily over previous coat. After seven (7) days, may remove with alcohol and continue cycle. 6.6 mL 2  . dorzolamide-timolol (COSOPT) 22.3-6.8 MG/ML ophthalmic solution Place 1 drop into both eyes 2 (two)  times daily.     [name] ELIQUIS 5 MG TABS tablet TAKE 1 TABLET TWICE A DAY 180 tablet 1  . finasteride (PROSCAR) 5 MG tablet Take 5 mg by mouth daily.    . fluocinonide cream (LIDEX) 2.11 % Apply 1 application topically 2 (two) times daily.    [name] latanoprost (XALATAN) 0.005 % ophthalmic solution Place 1 drop into both eyes daily.    [name] levothyroxine (SYNTHROID) 100 MCG tablet TAKE 1 TABLET BY MOUTH IN THE MORNING 30 MINS TO 1 HOUR BEFORE FOOD    . levothyroxine (SYNTHROID) 75 MCG tablet Take 75 mcg by mouth daily.    [name] losartan (COZAAR) 50 MG tablet Take 1 tablet (50 mg total) by mouth daily. 90 tablet 3  . Multiple Vitamins-Minerals (CENTRUM SILVER PO) Take 1 tablet by mouth daily.     . rosuvastatin (CRESTOR) 40 MG tablet Take 1 tablet (40 mg total) by mouth daily. 90 tablet 3  . sildenafil (VIAGRA) 100 MG tablet Take 100 mg by mouth as needed.     . tamsulosin (FLOMAX) 0.4 MG CAPS capsule Take 2 capsules (0.8 mg total) by mouth daily. 30 capsule 0  . triamcinolone ointment (KENALOG) 0.1 % Apply 1 application topically 2 (two) times daily as needed (DRY SKIN).      No current facility-administered medications on file prior to visit.    Allergies  Allergen Reactions  . Pork-Derived  Products Swelling   Objective: Physical Exam  General: 86 y.o. AA male, WD, WN in no acute distress. AAO x 3.  Neurovascular status unchanged b/l.  Capillary refill time to digits immediate b/l. Palpable DP pulses b/l. Palpable PT pulses b/l. Pedal hair present b/l. Skin temperature gradient within normal limits b/l. No edema noted b/l.  Protective sensation intact 5/5 intact bilaterally with 10g monofilament b/l. Vibratory sensation intact b/l. Proprioception intact bilaterally. Babinski reflex negative b/l. Clonus negative b/l.  Dermatological:  Pedal skin with normal turgor, texture and tone bilaterally. No open wounds bilaterally. No interdigital macerations bilaterally. Toenails 1-5 b/l elongated, dystrophic,  thickened, crumbly with subungual debris and tenderness to dorsal palpation. Hyperkeratotic lesion(s) submet head 1 left foot, submet head 1 right foot, submet head 5 left foot and submet head 5 right foot.  No erythema, no edema, no drainage, no flocculence.  Musculoskeletal:  Normal muscle strength 5/5 to all lower extremity muscle groups bilaterally. No pain crepitus or joint limitation noted with ROM b/l. Hallux valgus with bunion deformity noted b/l.  Assessment and Plan:  1. Pain due to onychomycosis of toenails of both feet   2. Callus   3. Anemia due to chronic kidney disease, unspecified CKD stage      -Examined patient. -No new findings. No new orders. -Toenails 1-5 b/l were debrided in length and girth with sterile nail nippers and dremel without iatrogenic bleeding.  -Callus(es) submet head 1 left foot, submet head 1 right foot, submet head 5 left foot and submet head 5 right foot pared utilizing sterile scalpel blade without complication or incident. Total number debrided =4. -Patient to continue soft, supportive shoe gear daily. -Patient to report any pedal injuries to medical professional immediately. -Patient/POA to call should there be question/concern in the interim.  Return in about 3 months (around 02/12/2020).  Marzetta Board, DPM

## 2019-12-24 ENCOUNTER — Ambulatory Visit (INDEPENDENT_AMBULATORY_CARE_PROVIDER_SITE_OTHER): Payer: Medicare HMO | Admitting: Cardiovascular Disease

## 2019-12-24 ENCOUNTER — Other Ambulatory Visit: Payer: Self-pay

## 2019-12-24 ENCOUNTER — Encounter: Payer: Self-pay | Admitting: Cardiovascular Disease

## 2019-12-24 DIAGNOSIS — E782 Mixed hyperlipidemia: Secondary | ICD-10-CM | POA: Diagnosis not present

## 2019-12-24 DIAGNOSIS — I482 Chronic atrial fibrillation, unspecified: Secondary | ICD-10-CM | POA: Diagnosis not present

## 2019-12-24 NOTE — Progress Notes (Signed)
12/24/2019 Randy Fuller   03-03-1934  [id number]  Primary Physician Randy Late, MD Primary Cardiologist: Randy Harp MD Randy Fuller, [place]  HPI:  Randy Fuller is a 86 y.o.  thin-appearing, married [place] American male, father of 24, grandfather to 45 grandchildren who I sawin the office 12/15/2018.[name] He has a history of paroxysmal A-fib in the past, now chronic A-fib onEliquis oralanticoagulation. His other problems include hyperlipidemia. He is entirely asymptomatic. Dr. Sandi Fuller follows his lipid profile.hisCrestorwas increased from 20-40 mg a day by Dr. Sandi Fuller  Since I saw him back one year ago he denies chest pain or shortness of breath.He did have a Myoview ordered by Dr. Georgena Fuller 07/08/2017 which was nonischemic and low risk. A 2D echocardiogram performed 10/10/2017 showed normal LV size and function with a speckled appearance in his myocardium suggestive of amyloid however he is completely asymptomatic and therefore I will not pursue a pyrophosphate scan or cardiac MRI.  He saw Randy Ransom, PA-C in the office 06/18/2018 was sent complaints of fatigue. His heart rate at that time was 38. An event monitor showed A. fib with a slow ventricular response in the 30s and 40s on occasion. He denies syncope or presyncope.  I referred him to Dr. Caryl Fuller who evaluated him in the office on 08/17/2018 did not think he was a candidate for permanent transvenous pacing.   Since I saw him a year ago he is remained stable.  He denies chest pain, shortness of breath or dizziness.     Current Meds  Medication Sig  . allopurinol (ZYLOPRIM) 300 MG tablet Take 450 mg by mouth daily.  . Cholecalciferol 1000 UNITS capsule Take 1,000 Units by mouth daily.  . ciclopirox (PENLAC) 8 % solution Apply topically at bedtime. Apply over nail and surrounding skin. Apply daily over previous coat. After seven (7) days, may remove with alcohol and continue cycle.  .  dorzolamide-timolol (COSOPT) 22.3-6.8 MG/ML ophthalmic solution Place 1 drop into both eyes 2 (two) times daily.   [name] ELIQUIS 5 MG TABS tablet TAKE 1 TABLET TWICE A DAY  . finasteride (PROSCAR) 5 MG tablet Take 5 mg by mouth daily.  . fluocinonide cream (LIDEX) 7.61 % Apply 1 application topically 2 (two) times daily.  [name] latanoprost (XALATAN) 0.005 % ophthalmic solution Place 1 drop into both eyes daily.  [name] levothyroxine (SYNTHROID) 100 MCG tablet TAKE 1 TABLET BY MOUTH IN THE MORNING 30 MINS TO 1 HOUR BEFORE FOOD  . levothyroxine (SYNTHROID) 75 MCG tablet Take 75 mcg by mouth daily.  [name] losartan (COZAAR) 50 MG tablet Take 1 tablet (50 mg total) by mouth daily.  . Multiple Vitamins-Minerals (CENTRUM SILVER PO) Take 1 tablet by mouth daily.   . rosuvastatin (CRESTOR) 40 MG tablet Take 1 tablet (40 mg total) by mouth daily.  . sildenafil (VIAGRA) 100 MG tablet Take 100 mg by mouth as needed.   . tamsulosin (FLOMAX) 0.4 MG CAPS capsule Take 2 capsules (0.8 mg total) by mouth daily.  [name] triamcinolone ointment (KENALOG) 0.1 % Apply 1 application topically 2 (two) times daily as needed (DRY SKIN).      Allergies  Allergen Reactions  . Pork-Derived Products Swelling    Social History   Socioeconomic History  . Marital status: Married    Spouse name: Not on file  . Number of children: Not on file  . Years of education: Not on file  . Highest education level: Not on file  Occupational History  .  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 Smoking status: Never Smoker  . Smokeless tobacco: Never Used  Substance and Sexual Activity  . Alcohol use: No  . Drug use: No  . Sexual activity: Not on file  Other Topics Concern  . Not on file  Social History Narrative  . Not on file   Social Determinants of Health   Financial Resource Strain:   . Difficulty of Paying Living Expenses: Not on file  Food Insecurity:   . Worried About Charity fundraiser in the Last Year:  Not on file  . Ran Out of Food in the Last Year: Not on file  Transportation Needs:   . Lack of Transportation (Medical): Not on file  . Lack of Transportation (Non-Medical): Not on file  Physical Activity:   . Days of Exercise per Week: Not on file  . Minutes of Exercise per Session: Not on file  Stress:   . Feeling of Stress : Not on file  Social Connections:   . Frequency of Communication with Friends and Family: Not on file  . Frequency of Social Gatherings with Friends and Family: Not on file  . Attends Religious Services: Not on file  . Active Member of Clubs or Organizations: Not on file  . Attends Archivist Meetings: Not on file  . Marital Status: Not on file  Intimate Partner Violence:   . Fear of Current or Ex-Partner: Not on file  . Emotionally Abused: Not on file  . Physically Abused: Not on file  . Sexually Abused: Not on file     Review of Systems: General: negative for chills, fever, night sweats or weight changes.  Cardiovascular: negative for chest pain, dyspnea on exertion, edema, orthopnea, palpitations, paroxysmal nocturnal dyspnea or shortness of breath Dermatological: negative for rash Respiratory: negative for cough or wheezing Urologic: negative for hematuria Abdominal: negative for nausea, vomiting, diarrhea, bright red blood per rectum, melena, or hematemesis Neurologic: negative for visual changes, syncope, or dizziness All other systems reviewed and are otherwise negative except as noted above.    Blood pressure (!) 164/94, pulse (!) 50, height 5\' 11"  (1.803 m), weight 150 lb (68 kg).  General appearance: alert and no distress Neck: no adenopathy, no carotid bruit, no JVD, supple, symmetrical, trachea midline and thyroid not enlarged, symmetric, no tenderness/mass/nodules Lungs: clear to auscultation bilaterally Heart: irregularly irregular rhythm Extremities: extremities normal, atraumatic, no cyanosis or edema Pulses: 2+ and  symmetric Skin: Skin color, texture, turgor normal. No rashes or lesions Neurologic: Alert and oriented X 3, normal strength and tone. Normal symmetric reflexes. Normal coordination and gait  EKG atrial fibrillation with a ventricular sponsor 50 and voltage criteria for LVH with repolarization changes.  I personally reviewed this EKG.  ASSESSMENT AND PLAN:   Atrial fibrillation, chronic History of chronic atrial fibrillation rate controlled on Eliquis oral anticoagulation.  Hyperlipidemia History of hyperlipidemia on statin therapy followed by his PCP      Randy Harp MD [facility], [facility] 12/24/2019 2:44 PM

## 2019-12-24 NOTE — Assessment & Plan Note (Signed)
History of chronic atrial fibrillation rate controlled on Eliquis oral anticoagulation. ?

## 2019-12-24 NOTE — Patient Instructions (Signed)
Medication Instructions:  Your Physician recommend you continue on your current medication as directed.    *If you need a refill on your cardiac medications before your next appointment, please call your pharmacy*   Lab Work: None ordered   Testing/Procedures: None ordered    Follow-Up: At [facility] [place] [facility], you and your health needs are our priority.  As part of our continuing mission to provide you with exceptional heart care, we have created designated Provider Care Teams.  These Care Teams include your primary Cardiologist (physician) and Advanced Practice Providers (APPs -  Physician Assistants and Nurse Practitioners) who all work together to provide you with the care you need, when you need it.  We recommend signing up for the patient portal called "MyChart".  Sign up information is provided on this After Visit Summary.  MyChart is used to connect with patients for Virtual Visits (Telemedicine).  Patients are able to view lab/test results, encounter notes, upcoming appointments, etc.  Non-urgent messages can be sent to your provider as well.   To learn more about what you can do with MyChart, go to [email or web address].    Your next appointment:   1 year(s)  The format for your next appointment:   In Person  Provider:   Quay Burow, MD

## 2019-12-24 NOTE — Assessment & Plan Note (Signed)
History of hyperlipidemia on statin therapy followed by his PCP 

## 2020-01-07 ENCOUNTER — Other Ambulatory Visit: Payer: Self-pay | Admitting: Cardiovascular Disease

## 2020-02-14 ENCOUNTER — Other Ambulatory Visit: Payer: Self-pay | Admitting: Cardiovascular Disease

## 2020-02-18 ENCOUNTER — Encounter: Payer: Self-pay | Admitting: Podiatry

## 2020-02-18 ENCOUNTER — Ambulatory Visit (INDEPENDENT_AMBULATORY_CARE_PROVIDER_SITE_OTHER): Payer: Medicare HMO | Admitting: Podiatry

## 2020-02-18 ENCOUNTER — Other Ambulatory Visit: Payer: Self-pay

## 2020-02-18 DIAGNOSIS — B351 Tinea unguium: Secondary | ICD-10-CM

## 2020-02-18 DIAGNOSIS — D631 Anemia in chronic kidney disease: Secondary | ICD-10-CM | POA: Diagnosis not present

## 2020-02-18 DIAGNOSIS — M79675 Pain in left toe(s): Secondary | ICD-10-CM

## 2020-02-18 DIAGNOSIS — M79674 Pain in right toe(s): Secondary | ICD-10-CM | POA: Diagnosis not present

## 2020-02-18 DIAGNOSIS — N189 Chronic kidney disease, unspecified: Secondary | ICD-10-CM

## 2020-02-18 DIAGNOSIS — L84 Corns and callosities: Secondary | ICD-10-CM | POA: Diagnosis not present

## 2020-02-20 NOTE — Progress Notes (Signed)
Subjective: Randy Fuller is a 86 y.o. male patient seen today painful mycotic nails b/l that are difficult to trim. Pain interferes with ambulation. Aggravating factors include wearing enclosed shoe gear. Pain is relieved with periodic professional debridement   He voices no new pedal concerns on today's visit.  His PCP is Dr. Derinda Late. Last visit was 01/06/2019.  Patient Active Problem List   Diagnosis Date Noted  . Chronic atrial fibrillation ([place])   . Small bowel obstruction ([place]) 07/30/2018  . Abnormal echocardiogram 05/20/2018  . Vitamin D deficiency   . Nocturia   . Internal hemorrhoids   . Impaired glucose tolerance   . Hyperlipidemia   . Hearing loss   . Gout   . Glaucoma   . GERD (gastroesophageal reflux disease)   . Fatty liver   . Esophagitis   . Enlarged prostate   . Elevated LFTs   . ED (erectile dysfunction)   . Dry skin   . Diverticulosis   . Colon polyps   . Cataract   . Bruises easily   . Bilateral pneumonia   . Benign prostate hyperplasia   . Arthritis   . Anemia   . Atrial fibrillation, chronic 10/09/2016  . Vitreous syneresis of both eyes 08/22/2016  . Esophageal stricture   . Macular edema 10/08/2012  . Chronic anticoagulation 08/13/2012  . Branch retinal vein occlusion with macular edema of right eye 06/01/2012  . Primary open angle glaucoma (POAG) 03/01/2011  . Benign neoplasm of colon 06/21/2003    Current Outpatient Medications on File Prior to Visit  Medication Sig Dispense Refill  . allopurinol (ZYLOPRIM) 300 MG tablet Take 450 mg by mouth daily.    [name] apixaban (ELIQUIS) 5 MG TABS tablet Take 1 tablet (5 mg total) by mouth 2 (two) times daily. Labs needed for further refills 60 tablet 0  . Cholecalciferol 1000 UNITS capsule Take 1,000 Units by mouth daily.    . ciclopirox (PENLAC) 8 % solution Apply topically at bedtime. Apply over nail and surrounding skin. Apply daily over previous coat. After seven (7) days, may remove with  alcohol and continue cycle. 6.6 mL 2  . dorzolamide-timolol (COSOPT) 22.3-6.8 MG/ML ophthalmic solution Place 1 drop into both eyes 2 (two) times daily.     . finasteride (PROSCAR) 5 MG tablet Take 5 mg by mouth daily.    . Fluocinonide 0.1 % CREA Apply topically.    . latanoprost (XALATAN) 0.005 % ophthalmic solution Place 1 drop into both eyes daily.    [name] levothyroxine (SYNTHROID) 100 MCG tablet TAKE 1 TABLET BY MOUTH IN THE MORNING 30 MINS TO 1 HOUR BEFORE FOOD    . levothyroxine (SYNTHROID) 75 MCG tablet Take 75 mcg by mouth daily.    [name] losartan (COZAAR) 50 MG tablet Take 1 tablet (50 mg total) by mouth daily. 90 tablet 3  . Multiple Vitamins-Minerals (CENTRUM SILVER PO) Take 1 tablet by mouth daily.     . rosuvastatin (CRESTOR) 40 MG tablet Take 1 tablet (40 mg total) by mouth daily. 90 tablet 3  . sildenafil (VIAGRA) 100 MG tablet Take by mouth.    . tamsulosin (FLOMAX) 0.4 MG CAPS capsule Take 2 capsules (0.8 mg total) by mouth daily. 30 capsule 0  . triamcinolone ointment (KENALOG) 0.1 % Apply 1 application topically 2 (two) times daily as needed (DRY SKIN).      No current facility-administered medications on file prior to visit.    Allergies  Allergen Reactions  . Pork-Derived  Products Swelling   Objective: Physical Exam  General: 86 y.o. AA male, WD, WN in no acute distress. AAO x 3.  Neurovascular status unchanged b/l.  Capillary refill time to digits immediate b/l. Palpable DP pulses b/l. Palpable PT pulses b/l. Pedal hair present b/l. Skin temperature gradient within normal limits b/l. No edema noted b/l.  Protective sensation intact 5/5 intact bilaterally with 10g monofilament b/l. Vibratory sensation intact b/l. Proprioception intact bilaterally. Babinski reflex negative b/l. Clonus negative b/l.  Dermatological:  Pedal skin with normal turgor, texture and tone bilaterally. No open wounds bilaterally. No interdigital macerations bilaterally. Toenails 1-5 b/l elongated,  dystrophic, thickened, crumbly with subungual debris and tenderness to dorsal palpation. Hyperkeratotic lesion(s) submet head 1 left foot, submet head 1 right foot, submet head 5 left foot and submet head 5 right foot.  No erythema, no edema, no drainage, no flocculence.  Musculoskeletal:  Normal muscle strength 5/5 to all lower extremity muscle groups bilaterally. No pain crepitus or joint limitation noted with ROM b/l. Hallux valgus with bunion deformity noted b/l.  Assessment and Plan:  1. Pain due to onychomycosis of toenails of both feet   2. Callus   3. Anemia due to chronic kidney disease, unspecified CKD stage      -Examined patient. -No new findings. No new orders. -Toenails 1-5 b/l were debrided in length and girth with sterile nail nippers and dremel without iatrogenic bleeding.  -Callus(es) submet head 1 left foot, submet head 1 right foot, submet head 5 left foot and submet head 5 right foot pared utilizing sterile scalpel blade without complication or incident. Total number debrided =4. -Patient to continue soft, supportive shoe gear daily. -Patient to report any pedal injuries to medical professional immediately. -Patient/POA to call should there be question/concern in the interim.  Return in about 3 months (around 05/20/2020).  Marzetta Board, DPM

## 2020-03-21 ENCOUNTER — Other Ambulatory Visit: Payer: Self-pay | Admitting: Cardiovascular Disease

## 2020-05-30 ENCOUNTER — Other Ambulatory Visit: Payer: Self-pay

## 2020-05-30 ENCOUNTER — Encounter: Payer: Self-pay | Admitting: Podiatry

## 2020-05-30 ENCOUNTER — Ambulatory Visit (INDEPENDENT_AMBULATORY_CARE_PROVIDER_SITE_OTHER): Payer: Medicare HMO | Admitting: Podiatry

## 2020-05-30 DIAGNOSIS — L84 Corns and callosities: Secondary | ICD-10-CM | POA: Diagnosis not present

## 2020-05-30 DIAGNOSIS — M79675 Pain in left toe(s): Secondary | ICD-10-CM

## 2020-05-30 DIAGNOSIS — D631 Anemia in chronic kidney disease: Secondary | ICD-10-CM | POA: Diagnosis not present

## 2020-05-30 DIAGNOSIS — M79674 Pain in right toe(s): Secondary | ICD-10-CM | POA: Diagnosis not present

## 2020-05-30 DIAGNOSIS — N189 Chronic kidney disease, unspecified: Secondary | ICD-10-CM

## 2020-05-30 DIAGNOSIS — B351 Tinea unguium: Secondary | ICD-10-CM | POA: Diagnosis not present

## 2020-05-31 ENCOUNTER — Telehealth: Payer: Self-pay | Admitting: Cardiovascular Disease

## 2020-05-31 DIAGNOSIS — I482 Chronic atrial fibrillation, unspecified: Secondary | ICD-10-CM

## 2020-05-31 MED ORDER — APIXABAN 5 MG PO TABS: 5.0000 mg | ORAL_TABLET | Freq: Two times a day (BID) | ORAL | 0 refills | Status: DC

## 2020-05-31 NOTE — Telephone Encounter (Signed)
*  STAT* If patient is at the pharmacy, call can be transferred to refill team.   1. Which medications need to be refilled? (please list name of each medication and dose if known)  apixaban (ELIQUIS) 5 MG TABS tablet  2. Which pharmacy/location (including street and city if local pharmacy) is medication to be sent to? [facility] [place] [place] [facility] Portal to [facility] Sites  3. Do they need a 30 day or 90 day supply? 90  Patient needs this medication to go to his mail order pharmacy NOT the local pharmacy. He gets a better price when he gets it from the mail

## 2020-05-31 NOTE — Telephone Encounter (Signed)
24m, 68kg, Creatinine, Serum 1.080 mg/ 08/04/2018 (OUTDATED), LOVW/BERRY 12/24/19. OUTDATED LABS WILL SEND ONE MONTH REFILL W/INSTRUCTIONS ON BOTTLE LABS NEEDED FOR FURTHER REFILLS

## 2020-06-04 NOTE — Progress Notes (Signed)
Subjective: Randy Fuller is a 87 y.o. male patient seen today painful mycotic nails b/l that are difficult to trim. Pain interferes with ambulation. Aggravating factors include wearing enclosed shoe gear. Pain is relieved with periodic professional debridement   He voices no new pedal concerns on today's visit.  His PCP is Dr. Derinda Late.   Allergies  Allergen Reactions  . Pork-Derived Products Swelling   Objective: Physical Exam  General: 87 y.o. AA male, WD, WN in no acute distress. AAO x 3.  Neurovascular status unchanged b/l.  Capillary refill time to digits immediate b/l. Palpable DP pulses b/l. Palpable PT pulses b/l. Pedal hair present b/l. Skin temperature gradient within normal limits b/l. No edema noted b/l.  Protective sensation intact 5/5 intact bilaterally with 10g monofilament b/l. Vibratory sensation intact b/l. Proprioception intact bilaterally. Babinski reflex negative b/l. Clonus negative b/l.  Dermatological:  Pedal skin with normal turgor, texture and tone bilaterally. No open wounds bilaterally. No interdigital macerations bilaterally. Toenails 1-5 b/l elongated, dystrophic, thickened, crumbly with subungual debris and tenderness to dorsal palpation. Hyperkeratotic lesion(s) submet head 1 left foot, submet head 1 right foot, submet head 5 left foot and submet head 5 right foot.  No erythema, no edema, no drainage, no flocculence.  Musculoskeletal:  Normal muscle strength 5/5 to all lower extremity muscle groups bilaterally. No pain crepitus or joint limitation noted with ROM b/l. Hallux valgus with bunion deformity noted b/l.  Assessment and Plan:  1. Pain due to onychomycosis of toenails of both feet   2. Callus   3. Anemia due to chronic kidney disease, unspecified CKD stage    -Examined patient. -No new findings. No new orders. -Toenails 1-5 b/l were debrided in length and girth with sterile nail nippers and dremel without iatrogenic bleeding.   -Callus(es) submet head 1 left foot, submet head 1 right foot, submet head 5 left foot and submet head 5 right foot pared utilizing sterile scalpel blade without complication or incident. Total number debrided =4. -Patient to continue soft, supportive shoe gear daily. -Patient to report any pedal injuries to medical professional immediately. -Patient/POA to call should there be question/concern in the interim.  Return in about 3 months (around 08/30/2020).  Marzetta Board, DPM

## 2020-07-05 ENCOUNTER — Other Ambulatory Visit: Payer: Self-pay | Admitting: Cardiovascular Disease

## 2020-07-05 DIAGNOSIS — I482 Chronic atrial fibrillation, unspecified: Secondary | ICD-10-CM

## 2020-07-05 MED ORDER — ELIQUIS 5 MG PO TABS: 5.0000 mg | ORAL_TABLET | Freq: Two times a day (BID) | ORAL | 0 refills | Status: DC

## 2020-07-05 NOTE — Telephone Encounter (Signed)
*  STAT* If patient is at the pharmacy, call can be transferred to refill team.   1. Which medications need to be refilled? (please list name of each medication and dose if known) apixaban (ELIQUIS) 5 MG TABS tablet  2. Which pharmacy/location (including street and city if local pharmacy) is medication to be sent to? [facility] [place], [place] AT Portal to [facility]istered [facility] Sites  3. Do they need a 30 day or 90 day supply? 90 day supply  Only has 10 days worth of medication left. Please send prescription so it will arrive at his home in time of him ruining out.

## 2020-07-05 NOTE — Telephone Encounter (Signed)
33m, 68kg, lovw/berry 12/24/19, Creatinine, Serum 1.080 mg/ 08/04/2018 (OVERDUE). Called and lmomed the pt that they needed labs asap

## 2020-07-07 LAB — COMPREHENSIVE METABOLIC PANEL
ALT: 19 IU/L (ref 0–44)
AST: 33 IU/L (ref 0–40)
Albumin/Globulin Ratio: 1.6 (ref 1.2–2.2)
Albumin: 4 g/dL (ref 3.6–4.6)
Alkaline Phosphatase: 116 IU/L (ref 44–121)
BUN/Creatinine Ratio: 14 (ref 10–24)
BUN: 16 mg/dL (ref 8–27)
Bilirubin Total: 0.7 mg/dL (ref 0.0–1.2)
CO2: 25 mmol/L (ref 20–29)
Calcium: 9.6 mg/dL (ref 8.6–10.2)
Chloride: 107 mmol/L — ABNORMAL HIGH (ref 96–106)
Creatinine, Ser: 1.18 mg/dL (ref 0.76–1.27)
Globulin, Total: 2.5 g/dL (ref 1.5–4.5)
Glucose: 96 mg/dL (ref 65–99)
Potassium: 4.3 mmol/L (ref 3.5–5.2)
Sodium: 144 mmol/L (ref 134–144)
Total Protein: 6.5 g/dL (ref 6.0–8.5)
eGFR: 60 mL/min/{1.73_m2} (ref >59)

## 2020-07-18 IMAGING — DX PORTABLE ABDOMEN - 1 VIEW
1 series · 1 of 1 positions shown · non-contrast
Comparison: Radiograph July 30, 2018.

CLINICAL DATA: Small bowel obstruction

EXAM:
PORTABLE ABDOMEN - 1 VIEW

[abdomen kub]
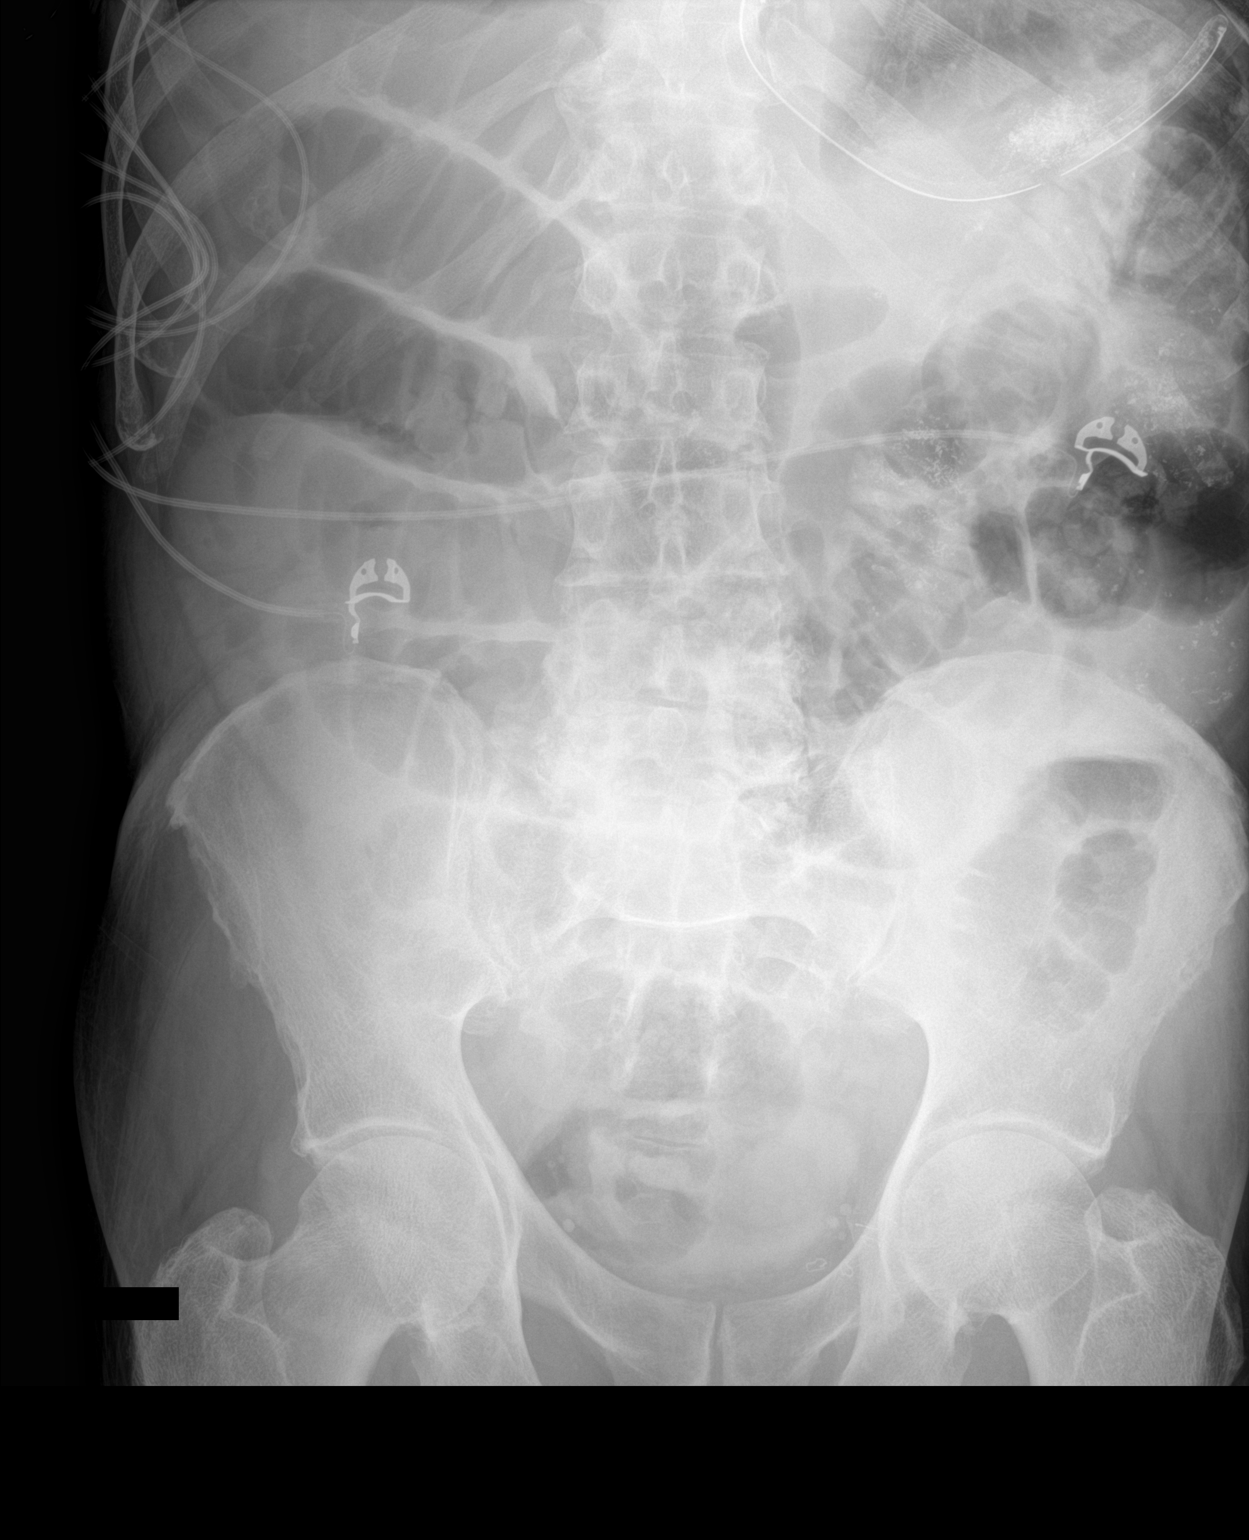

[1 of 1 positions shown; findings below may reference images not displayed]

FINDINGS: Stable small bowel dilatation is noted concerning for distal small
bowel obstruction. No colonic dilatation is noted. Phleboliths are
noted in the pelvis. Nasogastric tube is seen in the proximal
stomach.
IMPRESSION: Stable small bowel dilatation is noted concerning for distal small
bowel obstruction.

## 2020-07-19 IMAGING — DX PORTABLE ABDOMEN - 1 VIEW
1 series · 1 of 1 positions shown · non-contrast
Comparison: Abdominal x-ray from yesterday.

CLINICAL DATA: Small bowel obstruction.

EXAM:
PORTABLE ABDOMEN - 1 VIEW

[abdomen]
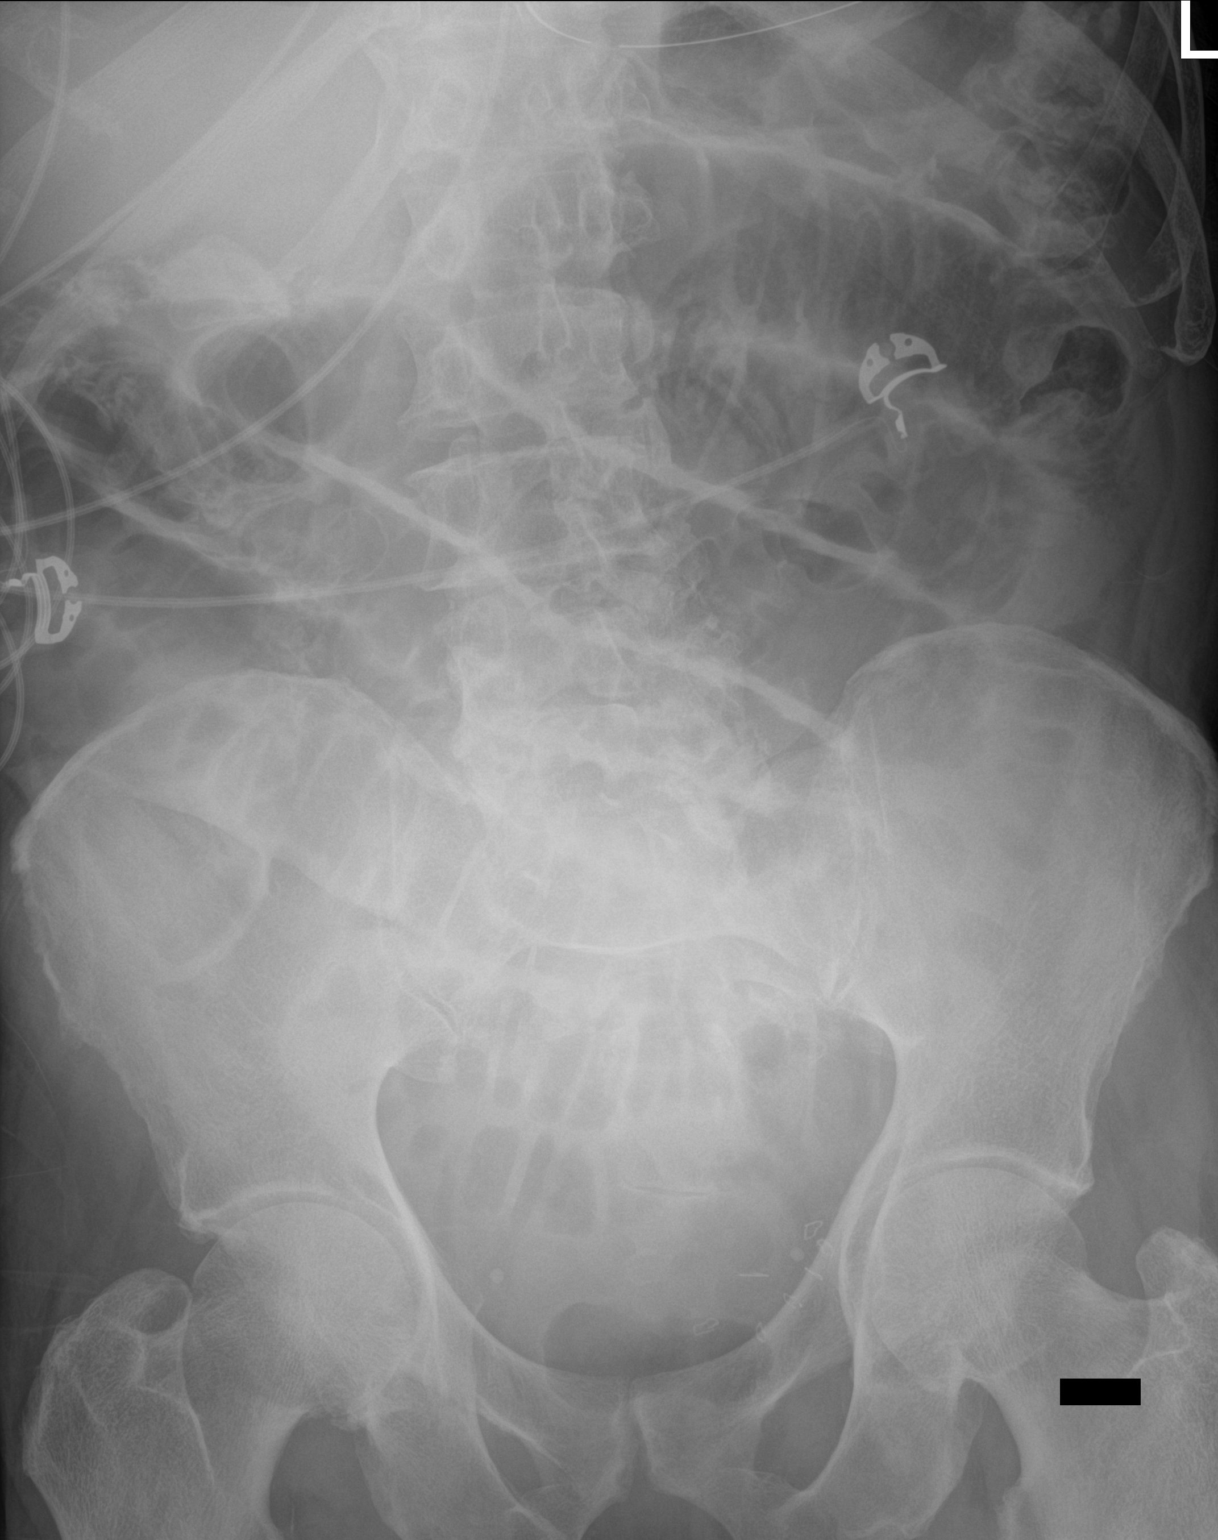

[1 of 1 positions shown; findings below may reference images not displayed]

FINDINGS: Partially visualized nasogastric tube in the stomach. Diffusely
dilated small bowel is unchanged. No acute osseous abnormality.
IMPRESSION: Unchanged small bowel obstruction.

## 2020-07-20 IMAGING — CR PORTABLE ABDOMEN - 1 VIEW
1 series · 1 of 1 positions shown · non-contrast
Comparison: Abdominal x-ray from yesterday.

CLINICAL DATA: Small bowel obstruction.

EXAM:
PORTABLE ABDOMEN - 1 VIEW

[AP]
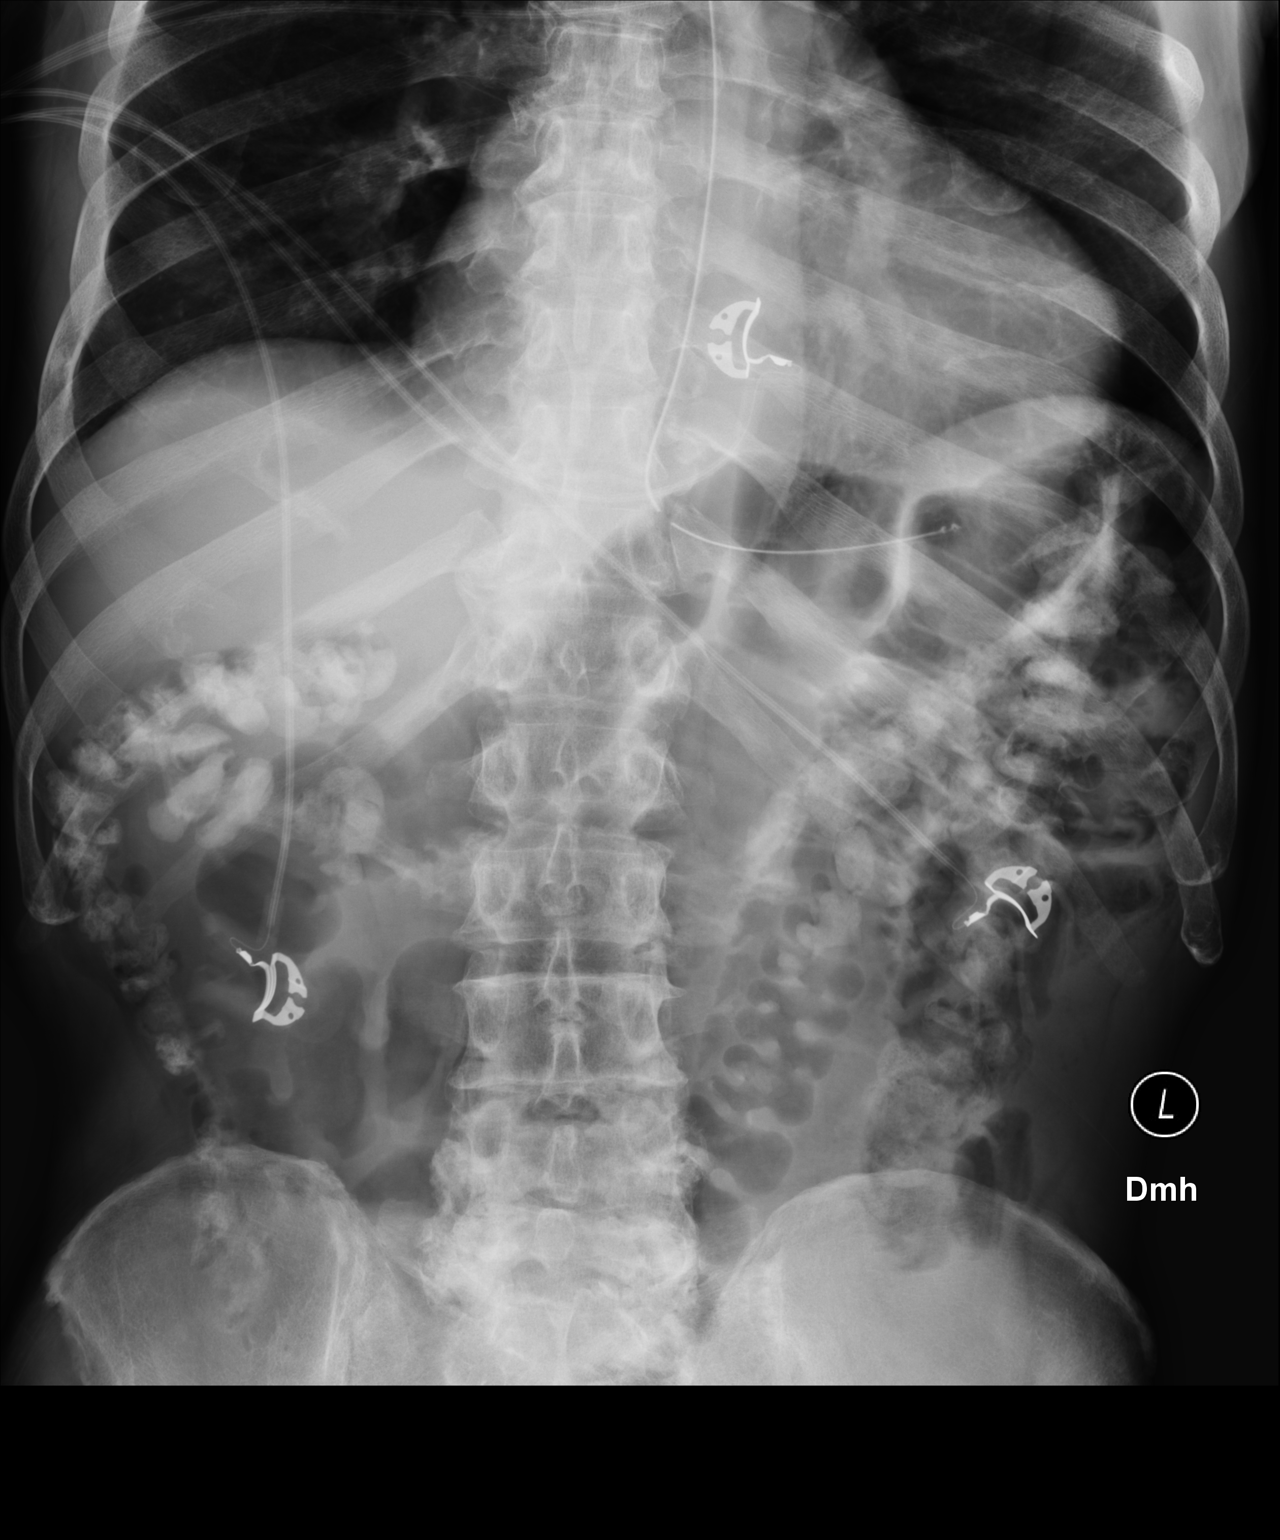

[1 of 1 positions shown; findings below may reference images not displayed]

FINDINGS: Enteric tube in the stomach. Small bowel dilatation has improved
with persistent mild dilatation of several proximal small bowel
loops. Oral contrast is seen within the relatively decompressed
colon. No acute osseous abnormality.
IMPRESSION: 1. Improving small bowel obstruction.

## 2020-07-21 IMAGING — DX ABDOMEN - 1 VIEW
1 series · 1 of 1 positions shown · non-contrast
Comparison: August 02, 2018

CLINICAL DATA: Abdominal pain and distension. Assess for
small-bowel obstruction

EXAM:
ABDOMEN - 1 VIEW

[abdomen kub]
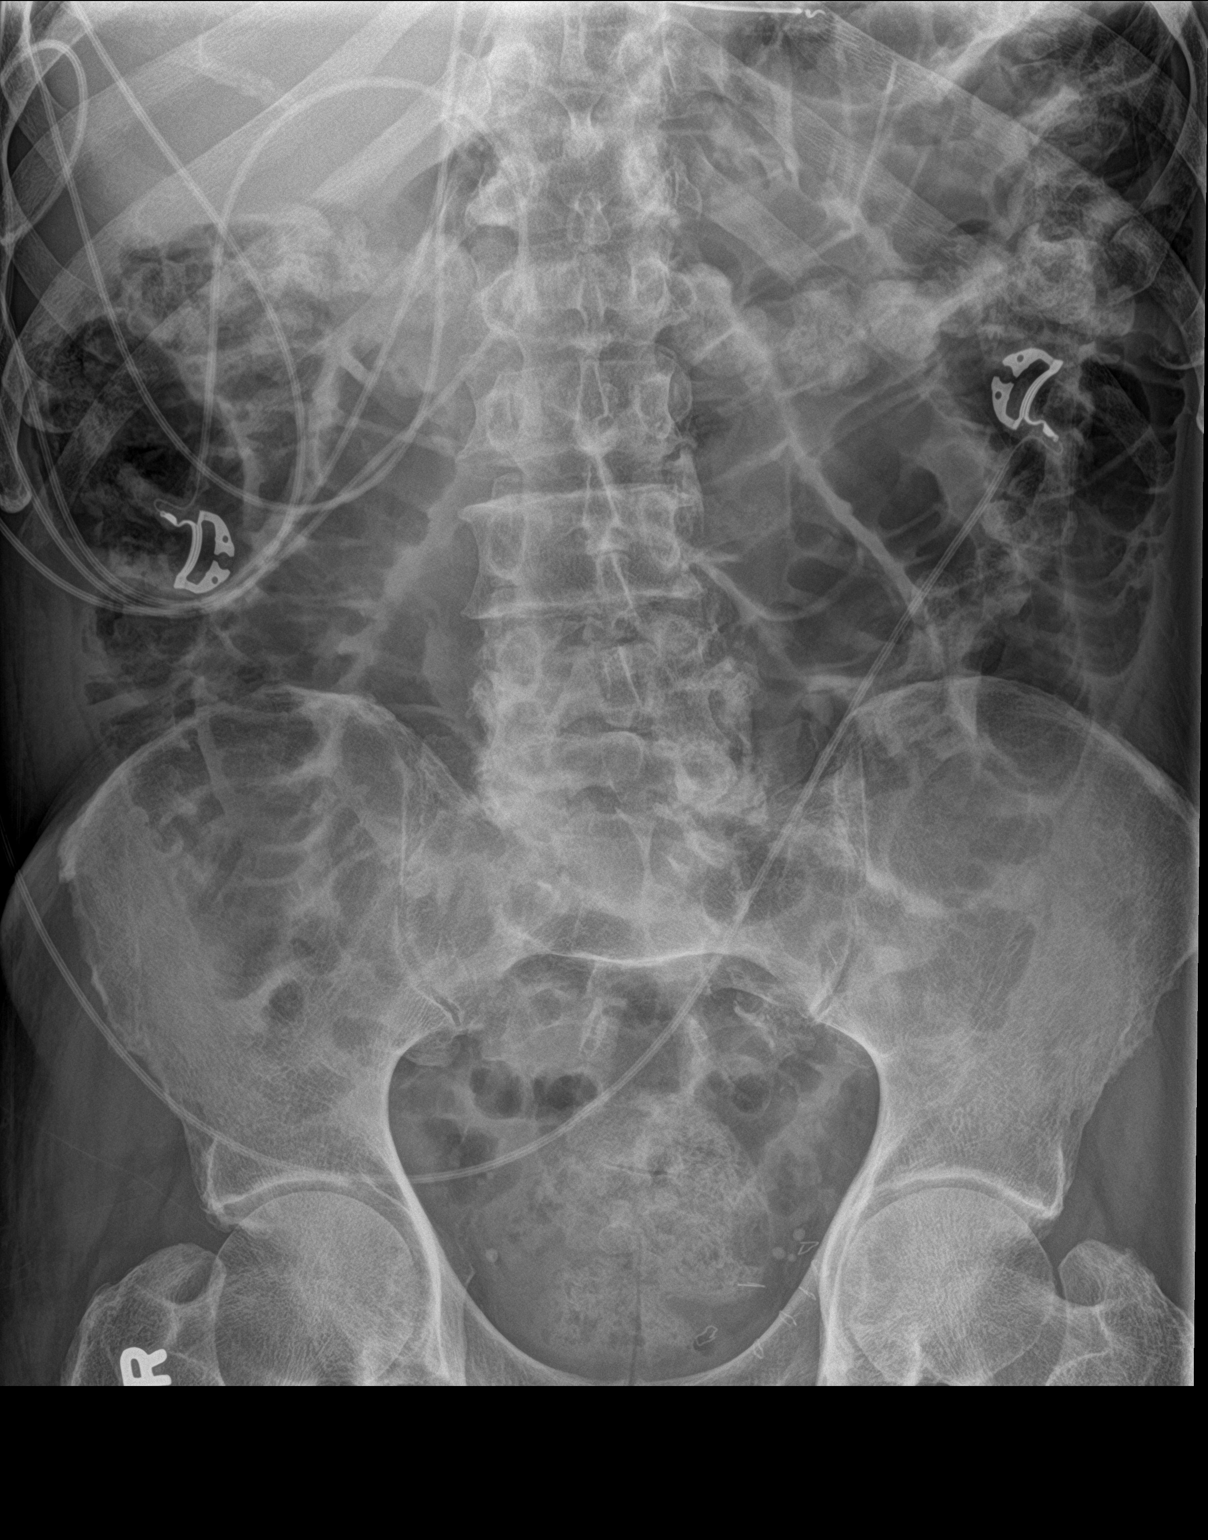

[1 of 1 positions shown; findings below may reference images not displayed]

FINDINGS: Air-filled dilated small bowel loops are identified with the number
of dilated small bowel loops increased compared to prior exam. The
colon is decompressed with contrast material present.
IMPRESSION: Air-filled dilated small bowel loops are identified throughout the
abdomen and pelvis, the number of dilated small bowel loops are
increased compared to prior exam, suggesting worsening small bowel
obstruction.

## 2020-08-15 ENCOUNTER — Other Ambulatory Visit: Payer: Self-pay

## 2020-08-15 ENCOUNTER — Other Ambulatory Visit (HOSPITAL_COMMUNITY): Payer: Self-pay | Admitting: Family Medicine

## 2020-08-15 ENCOUNTER — Ambulatory Visit (HOSPITAL_COMMUNITY)
Admission: RE | Admit: 2020-08-15 | Discharge: 2020-08-15 | Disposition: A | Discharge status: Home / Self Care | Payer: Medicare HMO | Source: Ambulatory Visit | Attending: Family Medicine | Admitting: Family Medicine

## 2020-08-15 DIAGNOSIS — M79662 Pain in left lower leg: Secondary | ICD-10-CM

## 2020-08-15 DIAGNOSIS — M7989 Other specified soft tissue disorders: Secondary | ICD-10-CM | POA: Diagnosis not present

## 2020-08-15 NOTE — Progress Notes (Signed)
Lower extremity venous has been completed.   Preliminary results in [facility].   Randy Fuller 08/15/2020 4:14 PM

## 2020-08-21 ENCOUNTER — Telehealth: Payer: Self-pay | Admitting: Cardiovascular Disease

## 2020-08-21 ENCOUNTER — Other Ambulatory Visit: Payer: Self-pay | Admitting: Cardiovascular Disease

## 2020-08-21 MED ORDER — ELIQUIS 5 MG PO TABS: 5.0000 mg | ORAL_TABLET | Freq: Two times a day (BID) | ORAL | 0 refills | Status: DC

## 2020-08-21 NOTE — Telephone Encounter (Signed)
87 M 68 kg SCr 1.18 (06/2020), LOV [place] Found 12/2019

## 2020-08-21 NOTE — Telephone Encounter (Signed)
*  STAT* If patient is at the pharmacy, call can be transferred to refill team.   1. Which medications need to be refilled? (please list name of each medication and dose if known) ELIQUIS   2. Which pharmacy/location (including street and city if local pharmacy) is medication to be sent to? [facility] [place], [place] AT Portal to [facility]istered [facility] Sites  3. Do they need a 30 day or 90 day supply? 90 DAY SUPPLY   IF UNABLE TO DO A  90 DAY SUPPLY PLEASE CONTACT PT AND EXPLAIN

## 2020-08-22 NOTE — Telephone Encounter (Signed)
16m, 68kg, scr 1.18 07/07/20 ,lovw/berry 12/24/19

## 2020-09-12 ENCOUNTER — Encounter: Payer: Self-pay | Admitting: Podiatry

## 2020-09-12 ENCOUNTER — Other Ambulatory Visit: Payer: Self-pay

## 2020-09-12 ENCOUNTER — Ambulatory Visit (INDEPENDENT_AMBULATORY_CARE_PROVIDER_SITE_OTHER): Payer: Medicare HMO | Admitting: Podiatry

## 2020-09-12 DIAGNOSIS — B351 Tinea unguium: Secondary | ICD-10-CM | POA: Diagnosis not present

## 2020-09-12 DIAGNOSIS — M79675 Pain in left toe(s): Secondary | ICD-10-CM | POA: Diagnosis not present

## 2020-09-12 DIAGNOSIS — M79674 Pain in right toe(s): Secondary | ICD-10-CM

## 2020-09-12 NOTE — Patient Instructions (Signed)
Apply Neosporin with Pain Relief to left great toe once daily for one week. Call office if you have any problems.

## 2020-09-18 NOTE — Progress Notes (Signed)
Subjective: Randy Fuller is a pleasant 87 y.o. male patient seen today painful thick toenails that are difficult to trim. Pain interferes with ambulation. Aggravating factors include wearing enclosed shoe gear. Pain is relieved with periodic professional debridement.  PCP is Derinda Late, MD. Last visit was: one month ago.  Allergies  Allergen Reactions   Pork-Derived Products Swelling    Objective: Physical Exam  General: Randy Fuller is a pleasant 87 y.o. African American male, WD, WN in NAD. AAO x 3.   Vascular:  Capillary refill time to digits immediate b/l. Palpable pedal pulses b/l LE. Pedal hair present. Lower extremity skin temperature gradient within normal limits. No pain with calf compression b/l. No edema noted b/l lower extremities.  Dermatological:  Pedal skin with normal turgor, texture and tone bilaterally. No open wounds bilaterally. No interdigital macerations bilaterally. Toenails 1-5 b/l elongated, discolored, dystrophic, thickened, crumbly with subungual debris and tenderness to dorsal palpation. Incurvated nailplate medial border(s) L hallux.  Nail border hypertrophy absent. There is tenderness to palpation. Sign(s) of infection: no clinical signs of infection noted on examination today..  Musculoskeletal:  Normal muscle strength 5/5 to all lower extremity muscle groups bilaterally. No pain crepitus or joint limitation noted with ROM b/l. Hallux valgus with bunion deformity noted b/l lower extremities.  Neurological:  Protective sensation intact 5/5 intact bilaterally with 10g monofilament b/l. Vibratory sensation intact b/l.  Assessment and Plan:  1. Pain due to onychomycosis of toenails of both feet     -Examined patient. -Patient to continue soft, supportive shoe gear daily. -Toenails 1-5 b/l were debrided in length and girth with sterile nail nippers and dremel without iatrogenic bleeding.  -Offending nail border debrided and curretaged L  hallux utilizing sterile nail nipper and currette. Border(s) cleansed with alcohol and triple antibiotic ointment applied. Patient instructed to apply Neosporin with Pain Relief to L hallux once daily for 7 days. -Patient to report any pedal injuries to medical professional immediately. -Patient/POA to call should there be question/concern in the interim.  Return in about 10 weeks (around 11/21/2020).  Marzetta Board, DPM

## 2020-09-22 ENCOUNTER — Telehealth: Payer: Self-pay | Admitting: Cardiovascular Disease

## 2020-09-22 ENCOUNTER — Other Ambulatory Visit: Payer: Self-pay

## 2020-09-22 MED ORDER — ELIQUIS 5 MG PO TABS: 1.0000 | ORAL_TABLET | Freq: Two times a day (BID) | ORAL | 1 refills | Status: DC

## 2020-09-22 NOTE — Telephone Encounter (Signed)
[facility] patient [facility] did  not get updated refill of Eliquis, Rx sent again  Eliquis 5 mg #2 Lot [id number] exp 6/24 left at front dest for pick  Advised patient

## 2020-09-22 NOTE — Telephone Encounter (Signed)
*  STAT* If patient is at the pharmacy, call can be transferred to refill team.   1. Which medications need to be refilled? (please list name of each medication and dose if known)  ELIQUIS 5 MG TABS tablet  2. Which pharmacy/location (including street and city if local pharmacy) is medication to be sent to? [id number] [place] [address]  3. Do they need a 30 day or 90 day supply? 30  [facility] called (with patient on the line). The patient is out of his Eliquis. [facility] has faxed over a 90 day refill request, but the [place] wanted to know if we could send a 30 day supply to his local pharmacy.

## 2020-09-22 NOTE — Telephone Encounter (Signed)
Samples provided today (see telephone note) to cover patient until [facility] delivery.

## 2020-09-22 NOTE — Telephone Encounter (Signed)
Patient calling the office for samples of medication:   1.  What medication and dosage are you requesting samples for? ELIQUIS 5 MG TABS tablet   2.  Are you currently out of this medication? Yes    

## 2020-10-12 ENCOUNTER — Other Ambulatory Visit: Payer: Self-pay | Admitting: Family Medicine

## 2020-10-13 LAB — SARS CORONAVIRUS 2 (TAT 6-24 HRS): SARS Coronavirus 2: NEGATIVE

## 2020-11-21 ENCOUNTER — Ambulatory Visit (INDEPENDENT_AMBULATORY_CARE_PROVIDER_SITE_OTHER): Payer: Medicare HMO | Admitting: Podiatry

## 2020-11-21 ENCOUNTER — Encounter: Payer: Self-pay | Admitting: Podiatry

## 2020-11-21 ENCOUNTER — Other Ambulatory Visit: Payer: Self-pay

## 2020-11-21 DIAGNOSIS — M79675 Pain in left toe(s): Secondary | ICD-10-CM

## 2020-11-21 DIAGNOSIS — B351 Tinea unguium: Secondary | ICD-10-CM | POA: Diagnosis not present

## 2020-11-21 DIAGNOSIS — M79674 Pain in right toe(s): Secondary | ICD-10-CM | POA: Diagnosis not present

## 2020-11-21 NOTE — Progress Notes (Signed)
Subjective: Randy Fuller is a pleasant 87 y.o. male patient seen today for painful thick toenails that are difficult to trim. Pain interferes with ambulation. Aggravating factors include wearing enclosed shoe gear. Pain is relieved with periodic professional debridement.   Patient has dried blood on his feet and was unaware today. He states this is coming from his scrotal area which is irritated.  PCP is Derinda Late, MD. Last visit was: May, 2022.  Allergies  Allergen Reactions   Pork-Derived Products Swelling    Objective: Physical Exam  General: Randy Fuller is a pleasant 87 y.o. African American male, WD, WN in NAD. AAO x 3.   Vascular:  Capillary refill time to digits immediate b/l. Palpable pedal pulses b/l LE. Pedal hair present. Lower extremity skin temperature gradient within normal limits. No pain with calf compression b/l. No edema noted b/l lower extremities.  Dermatological:  Skin warm and supple b/l lower extremities. No open wounds b/l lower extremities. No interdigital macerations b/l lower extremities. Toenails 1-5 b/l elongated, discolored, dystrophic, thickened, crumbly with subungual debris and tenderness to dorsal palpation.  Musculoskeletal:  Normal muscle strength 5/5 to all lower extremity muscle groups bilaterally. Hallux valgus with bunion deformity noted b/l lower extremities.  Neurological:  Protective sensation intact 5/5 intact bilaterally with 10g monofilament b/l. Vibratory sensation intact b/l.  Assessment and Plan:  1. Pain due to onychomycosis of toenails of both feet      -Examined patient. -No new findings. No new orders. -I have instructed Mr. Ihde to contact his PCP regarding issues with his scrotum. -Patient to continue soft, supportive shoe gear daily. -Toenails 1-5 b/l were debrided in length and girth with sterile nail nippers and dremel without iatrogenic bleeding.  -Patient to report any pedal injuries to medical  professional immediately. -Patient/POA to call should there be question/concern in the interim.  Return in about 10 weeks (around 01/30/2021).  Marzetta Board, DPM

## 2021-01-02 ENCOUNTER — Encounter: Payer: Self-pay | Admitting: Cardiovascular Disease

## 2021-01-02 ENCOUNTER — Ambulatory Visit (INDEPENDENT_AMBULATORY_CARE_PROVIDER_SITE_OTHER): Payer: Medicare HMO | Admitting: Cardiovascular Disease

## 2021-01-02 ENCOUNTER — Other Ambulatory Visit: Payer: Self-pay

## 2021-01-02 VITALS — BP 124/52 | HR 56 | Ht 71.0 in | Wt 154.0 lb

## 2021-01-02 DIAGNOSIS — E782 Mixed hyperlipidemia: Secondary | ICD-10-CM | POA: Diagnosis not present

## 2021-01-02 DIAGNOSIS — I482 Chronic atrial fibrillation, unspecified: Secondary | ICD-10-CM

## 2021-01-02 MED ORDER — APIXABAN 5 MG PO TABS: 5.0000 mg | ORAL_TABLET | Freq: Two times a day (BID) | ORAL | 1 refills | Status: DC

## 2021-01-02 MED ORDER — APIXABAN 5 MG PO TABS: 5.0000 mg | ORAL_TABLET | Freq: Two times a day (BID) | ORAL | 3 refills | Status: DC

## 2021-01-02 NOTE — Patient Instructions (Signed)

## 2021-01-02 NOTE — Assessment & Plan Note (Signed)
History of chronic A. fib with a slow ventricular response not on negative chronotropic agents and asymptomatic.  He is on Eliquis oral anticoagulation.

## 2021-01-02 NOTE — Assessment & Plan Note (Addendum)
History of hyperlipidemia on statin therapy followed by his PCP.  Lipid profile performed 10/04/2020 revealed total cholesterol 143, LDL of 59 and HDL of 71.  His LDL particle number was 611.

## 2021-01-02 NOTE — Progress Notes (Addendum)
01/30/2021 Randy Fuller   11/22/1933  [id number]  Primary Physician Derinda Late, MD Primary Cardiologist: Lorretta Harp MD [name], [place]  HPI:  Randy Fuller is a 87 y.o.   thin-appearing, married [place] American male, father of 4, grandfather to 20 grandchildren who I saw in the office 12/24/2019.[name] He has a history of paroxysmal A-fib in the past, now chronic A-fib on Eliquis oral anticoagulation. His other problems include hyperlipidemia. He is entirely asymptomatic. Dr. Sandi Mariscal follows his lipid profile.his Crestor was increased from 20-40 mg a day by Dr. Sandi Mariscal   Since I saw him back one year ago he denies chest pain or shortness of breath.  He did have a Myoview ordered by Dr. Georgena Spurling 07/08/2017 which was nonischemic and low risk.  A 2D echocardiogram performed 10/10/2017 showed normal LV size and function with a speckled appearance in his myocardium suggestive of amyloid however he is completely asymptomatic and therefore I will not pursue a pyrophosphate scan or cardiac MRI.   He saw Kerin Ransom, PA-C in the office 06/18/2018 was sent complaints of fatigue.  His heart rate at that time was 38.  An event monitor showed A. fib with a slow ventricular response in the 30s and 40s on occasion.  He denies syncope or presyncope.   I referred him to Dr. Caryl Comes who evaluated him in the office on 08/17/2018 did not think he was a candidate for permanent transvenous pacing.    Since I saw him a year ago he is remained stable.  He denies chest pain, shortness of breath or dizziness.     Current Meds  Medication Sig   allopurinol (ZYLOPRIM) 300 MG tablet Take 450 mg by mouth daily.   brimonidine (ALPHAGAN) 0.2 % ophthalmic solution Place 1 drop into the right eye 2 times daily.   brimonidine (ALPHAGAN) 0.2 % ophthalmic solution SMARTSIG:In Eye(s)   Cholecalciferol 1000 UNITS capsule Take 1,000 Units by mouth daily.   ciclopirox (PENLAC) 8 % solution Apply topically  at bedtime. Apply over nail and surrounding skin. Apply daily over previous coat. After seven (7) days, may remove with alcohol and continue cycle.   dorzolamide-timolol (COSOPT) 22.3-6.8 MG/ML ophthalmic solution Place 1 drop into both eyes 2 (two) times daily.    finasteride (PROSCAR) 5 MG tablet Take 5 mg by mouth daily.   Fluocinonide 0.1 % CREA Apply topically.   latanoprost (XALATAN) 0.005 % ophthalmic solution Place 1 drop into both eyes daily.   latanoprost (XALATAN) 0.005 % ophthalmic solution Place 1 drop into both eyes nightly.   levothyroxine (SYNTHROID) 100 MCG tablet TAKE 1 TABLET BY MOUTH IN THE MORNING 30 MINS TO 1 HOUR BEFORE FOOD   levothyroxine (SYNTHROID) 75 MCG tablet Take 75 mcg by mouth daily.   losartan (COZAAR) 50 MG tablet Take 1 tablet (50 mg total) by mouth daily.   Multiple Vitamins-Minerals (CENTRUM SILVER PO) Take 1 tablet by mouth daily.    oxyCODONE (OXY IR/ROXICODONE) 5 MG immediate release tablet Take 5 mg by mouth every 4 (four) hours as needed.   rosuvastatin (CRESTOR) 40 MG tablet Take 1 tablet (40 mg total) by mouth daily.   sildenafil (VIAGRA) 100 MG tablet Take by mouth.   tamsulosin (FLOMAX) 0.4 MG CAPS capsule Take 2 capsules (0.8 mg total) by mouth daily.   triamcinolone ointment (KENALOG) 0.1 % Apply 1 application topically 2 (two) times daily as needed (DRY SKIN).    [DISCONTINUED] apixaban (ELIQUIS) 5 MG TABS  tablet Take 1 tablet (5 mg total) by mouth 2 (two) times daily.     Allergies  Allergen Reactions   Pork-Derived Products Swelling    Social History   Socioeconomic History   Marital status: Married    Spouse name: Not on file   Number of children: Not on file   Years of education: Not on file   Highest education level: Not on file  Occupational History   Occupation: Furniture conservator/restorer retired    Fish farm manager: RETIRED  Tobacco Use   Smoking status: Never   Smokeless tobacco: Never  Substance and Sexual Activity   Alcohol use: No   Drug  use: No   Sexual activity: Not on file  Other Topics Concern   Not on file  Social History Narrative   Not on file   Social Determinants of Health   Financial Resource Strain: Not on file  Food Insecurity: Not on file  Transportation Needs: Not on file  Physical Activity: Not on file  Stress: Not on file  Social Connections: Not on file  Intimate Partner Violence: Not on file     Review of Systems: General: negative for chills, fever, night sweats or weight changes.  Cardiovascular: negative for chest pain, dyspnea on exertion, edema, orthopnea, palpitations, paroxysmal nocturnal dyspnea or shortness of breath Dermatological: negative for rash Respiratory: negative for cough or wheezing Urologic: negative for hematuria Abdominal: negative for nausea, vomiting, diarrhea, bright red blood per rectum, melena, or hematemesis Neurologic: negative for visual changes, syncope, or dizziness All other systems reviewed and are otherwise negative except as noted above.    Blood pressure (!) 124/52, pulse (!) 56, height 5\' 11"  (1.803 m), weight 154 lb (69.9 kg).  General appearance: alert and no distress Neck: no adenopathy, no carotid bruit, no JVD, supple, symmetrical, trachea midline, and thyroid not enlarged, symmetric, no tenderness/mass/nodules Lungs: clear to auscultation bilaterally Heart: irregularly irregular rhythm Extremities: extremities normal, atraumatic, no cyanosis or edema Pulses: 2+ and symmetric Skin: Skin color, texture, turgor normal. No rashes or lesions Neurologic: Grossly normal  EKG atrial fibrillation with a ventricular sponsor 56 with voltage criteria for LVH with repolarization changes and septal Q waves.  I personally reviewed this EKG.  ASSESSMENT AND PLAN:   Atrial fibrillation, chronic History of chronic A. fib with a slow ventricular response not on negative chronotropic agents and asymptomatic.  He is on Eliquis oral  anticoagulation.  Hyperlipidemia History of hyperlipidemia on statin therapy followed by his PCP.  Lipid profile performed 10/04/2020 revealed total cholesterol 143, LDL of 59 and HDL of 71.  His LDL particle number was 611.     Lorretta Harp MD FACP,FACC,FAHA, [facility] 01/30/2021 10:04 AM

## 2021-01-08 ENCOUNTER — Other Ambulatory Visit: Payer: Self-pay | Admitting: Family Medicine

## 2021-01-09 LAB — SARS CORONAVIRUS 2 (TAT 6-24 HRS): SARS Coronavirus 2: NEGATIVE

## 2021-01-30 ENCOUNTER — Encounter: Payer: Self-pay | Admitting: Podiatry

## 2021-01-30 ENCOUNTER — Ambulatory Visit (INDEPENDENT_AMBULATORY_CARE_PROVIDER_SITE_OTHER): Payer: Medicare HMO | Admitting: Podiatry

## 2021-01-30 ENCOUNTER — Other Ambulatory Visit: Payer: Self-pay

## 2021-01-30 DIAGNOSIS — L600 Ingrowing nail: Secondary | ICD-10-CM

## 2021-01-30 DIAGNOSIS — B351 Tinea unguium: Secondary | ICD-10-CM | POA: Diagnosis not present

## 2021-01-30 DIAGNOSIS — M79674 Pain in right toe(s): Secondary | ICD-10-CM

## 2021-01-30 DIAGNOSIS — M79675 Pain in left toe(s): Secondary | ICD-10-CM

## 2021-02-03 NOTE — Progress Notes (Signed)
  Subjective:  Patient ID: Randy Fuller, male    DOB: 11-30-1933,  MRN: [id number]  87 y.o. male presents painful thick toenails that are difficult to trim. Pain interferes with ambulation. Aggravating factors include wearing enclosed shoe gear. Pain is relieved with periodic professional debridement.  He voices no new pedal problems on today's visit.  PCP is Derinda Late, MD , and last visit was July, 2022.  Allergies  Allergen Reactions   Pork-Derived Products Swelling   Review of Systems: Negative except as noted in the HPI.   Objective:  Vascular Examination: Pedal hair sparse. Lower extremity skin temperature gradient within normal limits.  Neurological Examination: Protective sensation intact 5/5 intact bilaterally with 10g monofilament b/l. Vibratory sensation intact b/l.  Dermatological Examination: Pedal skin with normal turgor, texture and tone b/l. Toenails 1-5 b/l thick, discolored, elongated with subungual debris and pain on dorsal palpation. No hyperkeratotic lesions noted b/l.   Incurvated nailplate left great toe medial border(s) with tenderness to palpation. No erythema, no edema, no drainage noted.   Musculoskeletal Examination: Hallux hammertoe noted b/l LE.  Radiographs: None Assessment:   1. Pain due to onychomycosis of toenails of both feet   2. Ingrown toenail without infection    Plan:  -Examined patient. -Patient to continue soft, supportive shoe gear daily. -Toenails 1-5 b/l were debrided in length and girth with sterile nail nippers and dremel without iatrogenic bleeding.  -Offending nail border debrided and curretaged L hallux utilizing sterile nail nipper and currette. Border(s) cleansed with alcohol and triple antibiotic ointment applied. Patient instructed to apply Neosporin to L hallux once daily for 7 days. -Patient/POA to call should there be question/concern in the interim.  Return in about 3 months (around 05/02/2021).  Marzetta Board, DPM

## 2021-05-14 ENCOUNTER — Other Ambulatory Visit: Payer: Self-pay

## 2021-05-14 ENCOUNTER — Ambulatory Visit (INDEPENDENT_AMBULATORY_CARE_PROVIDER_SITE_OTHER): Payer: Medicare HMO | Admitting: Podiatry

## 2021-05-14 ENCOUNTER — Encounter: Payer: Self-pay | Admitting: Podiatry

## 2021-05-14 DIAGNOSIS — D631 Anemia in chronic kidney disease: Secondary | ICD-10-CM | POA: Diagnosis not present

## 2021-05-14 DIAGNOSIS — L84 Corns and callosities: Secondary | ICD-10-CM

## 2021-05-14 DIAGNOSIS — M79675 Pain in left toe(s): Secondary | ICD-10-CM | POA: Diagnosis not present

## 2021-05-14 DIAGNOSIS — N189 Chronic kidney disease, unspecified: Secondary | ICD-10-CM

## 2021-05-14 DIAGNOSIS — Z7901 Long term (current) use of anticoagulants: Secondary | ICD-10-CM | POA: Diagnosis not present

## 2021-05-14 DIAGNOSIS — B351 Tinea unguium: Secondary | ICD-10-CM

## 2021-05-14 DIAGNOSIS — M79674 Pain in right toe(s): Secondary | ICD-10-CM | POA: Diagnosis not present

## 2021-05-20 NOTE — Progress Notes (Signed)
°  Subjective:  Patient ID: Randy Fuller, male    DOB: Nov 07, 1933,  MRN: [id number]  88 y.o. male presents  for at risk foot care with h/o afib and chronic anticoagulation and painful elongated mycotic toenails 1-5 bilaterally which are tender when wearing enclosed shoe gear. Pain is relieved with periodic professional debridement.  New problem(s): None   Patient is excited he has had another great grandchild born in [place]. This is his fifth great grandchild.  PCP is Derinda Late, MD , and last visit was July, 2022 per patient recall.  Allergies  Allergen Reactions   Pork-Derived Products Swelling   Review of Systems: Negative except as noted in the HPI.   Objective:  Vascular Examination: CFT <3 seconds b/l LE. Faintly palpable DP pulses b/l LE. Faintly palpable PT pulse(s) b/l LE. Pedal hair absent. No pain with calf compression b/l. Lower extremity skin temperature gradient within normal limits. No edema noted b/l LE. No cyanosis or clubbing noted b/l LE.  Neurological Examination: Sensation grossly intact b/l with 10 gram monofilament. Vibratory sensation intact b/l.   Dermatological Examination: Pedal skin with normal turgor, texture and tone b/l. Toenails 1-5 b/l thick, discolored, elongated with subungual debris and pain on dorsal palpation. Pedal skin thin and atrophic b/l LE. No open wounds b/l LE. No interdigital macerations noted b/l LE. Toenails 1-5 bilaterally elongated, discolored, dystrophic, thickened, and crumbly with subungual debris and tenderness to dorsal palpation. Hyperkeratotic lesion(s) submet head 1 left foot and submet head 5 b/l.  No erythema, no edema, no drainage, no fluctuance.  Musculoskeletal Examination: Muscle strength 5/5 to b/l LE. Muscle strength 5/5 to all lower extremity muscle groups bilaterally. No pain, crepitus or joint limitation noted with ROM bilateral LE. Hallux hammertoe noted b/l LE.  Radiographs: None   Assessment:   1. Pain  due to onychomycosis of toenails of both feet   2. Callus   3. Anemia due to chronic kidney disease, unspecified CKD stage   4. Chronic anticoagulation     Plan:  -Mycotic toenails 1-5 bilaterally were debrided in length and girth with sterile nail nippers and dremel without incident. -Callus(es) submet head 1 left foot and submet head 5 b/l pared utilizing sterile scalpel blade without complication or incident. Total number debrided =3. -Patient/POA to call should there be question/concern in the interim.  Return in about 3 months (around 08/11/2021).  Marzetta Board, DPM

## 2021-06-19 ENCOUNTER — Other Ambulatory Visit: Payer: Self-pay | Admitting: Family Medicine

## 2021-06-22 ENCOUNTER — Other Ambulatory Visit: Payer: Self-pay

## 2021-06-22 ENCOUNTER — Ambulatory Visit
Admission: RE | Admit: 2021-06-22 | Discharge: 2021-06-22 | Disposition: A | Discharge status: Home / Self Care | Payer: Medicare HMO | Source: Ambulatory Visit | Attending: Family Medicine | Admitting: Family Medicine

## 2021-06-22 ENCOUNTER — Other Ambulatory Visit: Payer: Self-pay | Admitting: Family Medicine

## 2021-06-22 DIAGNOSIS — U099 Post covid-19 condition, unspecified: Secondary | ICD-10-CM

## 2021-08-13 ENCOUNTER — Ambulatory Visit: Payer: Medicare HMO | Admitting: Podiatry

## 2021-08-15 ENCOUNTER — Ambulatory Visit (INDEPENDENT_AMBULATORY_CARE_PROVIDER_SITE_OTHER): Payer: Medicare HMO | Admitting: Podiatry

## 2021-08-15 ENCOUNTER — Encounter: Payer: Self-pay | Admitting: Podiatry

## 2021-08-15 DIAGNOSIS — Z7901 Long term (current) use of anticoagulants: Secondary | ICD-10-CM | POA: Diagnosis not present

## 2021-08-15 DIAGNOSIS — L84 Corns and callosities: Secondary | ICD-10-CM

## 2021-08-15 DIAGNOSIS — M79675 Pain in left toe(s): Secondary | ICD-10-CM | POA: Diagnosis not present

## 2021-08-15 DIAGNOSIS — N189 Chronic kidney disease, unspecified: Secondary | ICD-10-CM

## 2021-08-15 DIAGNOSIS — B351 Tinea unguium: Secondary | ICD-10-CM | POA: Diagnosis not present

## 2021-08-15 DIAGNOSIS — M79674 Pain in right toe(s): Secondary | ICD-10-CM | POA: Diagnosis not present

## 2021-08-15 DIAGNOSIS — D631 Anemia in chronic kidney disease: Secondary | ICD-10-CM | POA: Diagnosis not present

## 2021-08-23 NOTE — Progress Notes (Signed)
  Subjective:  Patient ID: Randy Fuller, male    DOB: 05/30/1933,  MRN: [id number]  Randy Fuller presents to clinic today for at risk foot care with h/o stage 3 CKD and callus(es) b/l lower extremities and painful thick toenails that are difficult to trim. Painful toenails interfere with ambulation. Aggravating factors include wearing enclosed shoe gear. Pain is relieved with periodic professional debridement. Painful calluses are aggravated when weightbearing with and without shoegear. Pain is relieved with periodic professional debridement.  New problem(s): None.   PCP is Derinda Late, MD , and last visit was October 12, 2020.  Allergies  Allergen Reactions   Pork-Derived Products Swelling    Review of Systems: Negative except as noted in the HPI.  Objective: No changes noted in today's physical examination. Randy Fuller is a pleasant 88 y.o. male, WD, WN in NAD. AAO x 3.   Vascular Examination: CFT <3 seconds b/l LE. Faintly palpable DP pulses b/l LE. Faintly palpable PT pulse(s) b/l LE. Pedal hair absent. No pain with calf compression b/l. Lower extremity skin temperature gradient within normal limits. No edema noted b/l LE. No cyanosis or clubbing noted b/l LE.  Neurological Examination: Sensation grossly intact b/l with 10 gram monofilament. Vibratory sensation intact b/l.   Dermatological Examination: Pedal skin with normal turgor, texture and tone b/l. Toenails 1-5 b/l thick, discolored, elongated with subungual debris and pain on dorsal palpation. Pedal skin thin and atrophic b/l LE. No open wounds b/l LE. No interdigital macerations noted b/l LE. Toenails 1-5 bilaterally elongated, discolored, dystrophic, thickened, and crumbly with subungual debris and tenderness to dorsal palpation. Hyperkeratotic lesion(s) submet head 1 left foot and submet head 5 b/l.  No erythema, no edema, no drainage, no fluctuance.  Incurvated nailplate left great toe medial border(s) with  tenderness to palpation. No erythema, no edema, no drainage noted.   Musculoskeletal Examination: Muscle strength 5/5 to b/l LE. Muscle strength 5/5 to all lower extremity muscle groups bilaterally. No pain, crepitus or joint limitation noted with ROM bilateral LE. Hallux hammertoe noted b/l LE.  Radiographs: None  Assessment/Plan: 1. Pain due to onychomycosis of toenails of both feet   2. Callus   3. Anemia due to chronic kidney disease, unspecified CKD stage   4. Chronic anticoagulation     -Patient was evaluated and treated. All patient's and/or POA's questions/concerns answered on today's visit. -Patient to continue soft, supportive shoe gear daily. -Toenails 1-5 b/l were debrided in length and girth with sterile nail nippers and dremel without iatrogenic bleeding.  -Offending nail border debrided and curretaged L hallux utilizing sterile nail nipper and currette. Border cleansed with alcohol and triple antibiotic applied. No further treatment required by patient/caregiver. Call office if there are any concerns. -Callus(es) submet head 1 left foot and submet head 5 b/l pared utilizing sterile scalpel blade without complication or incident. Total number debrided =3. -Patient/POA to call should there be question/concern in the interim.   Return in about 3 months (around 11/15/2021).  Marzetta Board, DPM

## 2021-11-21 ENCOUNTER — Encounter: Payer: Self-pay | Admitting: Podiatry

## 2021-11-21 ENCOUNTER — Ambulatory Visit (INDEPENDENT_AMBULATORY_CARE_PROVIDER_SITE_OTHER): Payer: Medicare HMO | Admitting: Podiatry

## 2021-11-21 DIAGNOSIS — N189 Chronic kidney disease, unspecified: Secondary | ICD-10-CM | POA: Diagnosis not present

## 2021-11-21 DIAGNOSIS — M79674 Pain in right toe(s): Secondary | ICD-10-CM | POA: Diagnosis not present

## 2021-11-21 DIAGNOSIS — L84 Corns and callosities: Secondary | ICD-10-CM | POA: Diagnosis not present

## 2021-11-21 DIAGNOSIS — Z7901 Long term (current) use of anticoagulants: Secondary | ICD-10-CM | POA: Diagnosis not present

## 2021-11-21 DIAGNOSIS — B351 Tinea unguium: Secondary | ICD-10-CM | POA: Diagnosis not present

## 2021-11-21 DIAGNOSIS — D631 Anemia in chronic kidney disease: Secondary | ICD-10-CM | POA: Diagnosis not present

## 2021-11-21 DIAGNOSIS — M79675 Pain in left toe(s): Secondary | ICD-10-CM | POA: Diagnosis not present

## 2021-11-28 NOTE — Progress Notes (Signed)
  Subjective:  Patient ID: Randy Fuller, male    DOB: [birth year],  MRN: [id number]  88 y.o. male presents with at risk foot care with h/o CKD and callus(es) b/l lower extremities and painful thick toenails that are difficult to trim. Painful toenails interfere with ambulation. Aggravating factors include wearing enclosed shoe gear. Pain is relieved with periodic professional debridement. Painful calluses are aggravated when weightbearing with and without shoegear. Pain is relieved with periodic professional debridement..    Patient also has anemia.   New problem(s): None    PCP: Derinda Late, MD and last visit was:  July, 2023.  Review of Systems: Negative except as noted in the HPI.   Allergies  Allergen Reactions   Pork-Derived Products Swelling   Objective:  There were no vitals filed for this visit. Constitutional Patient is a pleasant 88 y.o.  male WD, WN in NAD. AAO x 3.  Vascular Capillary fill time to digits <3 b/l.  DP/PT pulse(s) are faintly palpable b/l lower extremities. Pedal hair sparse. Lower extremity skin temperature gradient within normal limits. No pain with calf compression b/l. No edema noted b/l lower extremities. No cyanosis or clubbing noted.   Neurologic Protective sensation intact 5/5 intact bilaterally with 10g monofilament b/l. Vibratory sensation intact b/l. No clonus b/l.   Dermatologic Pedal skin is warm and supple b/l.  No open wounds b/l lower extremities. No interdigital macerations b/l lower extremities. Toenails 1-5 b/l elongated, discolored, dystrophic, thickened, crumbly with subungual debris and tenderness to dorsal palpation. Hyperkeratotic lesion(s) submet head 1 left foot and submet head 5 b/l.  No erythema, no edema, no drainage, no fluctuance.  Orthopedic: Normal muscle strength 5/5 to all lower extremity muscle groups bilaterally. Patient ambulates independent of any assistive aids. Hallux hammertoe noted b/l LE.    Assessment:   1. Pain  due to onychomycosis of toenails of both feet   2. Callus   3. Chronic anticoagulation   4. Anemia due to chronic kidney disease, unspecified CKD stage    Plan:  Patient was evaluated and treated and all questions answered. Consent given for treatment as described below: -Examined patient. -Mycotic toenails 1-5 bilaterally were debrided in length and girth with sterile nail nippers and dremel without incident. -Callus(es) submet head 1 left foot, submet head 5 b/l, and 1st metatarsal head right lower extremity pared utilizing sterile scalpel blade without complication or incident. Total number debrided =4. -Patient/POA to call should there be question/concern in the interim.  Return in about 9 weeks (around 01/23/2022).  Marzetta Board, DPM

## 2021-12-12 ENCOUNTER — Other Ambulatory Visit: Payer: Self-pay | Admitting: Cardiovascular Disease

## 2021-12-12 DIAGNOSIS — I482 Chronic atrial fibrillation, unspecified: Secondary | ICD-10-CM

## 2021-12-12 NOTE — Telephone Encounter (Signed)
Prescription refill request for Eliquis received. Indication: Afib Last office visit: 01/02/21 Gwenlyn Found)  Scr: 1.18 (07/07/20)  Age: 88 Weight: 69.9kg  Labs overdue. Called PCP. Pt had labs drawn at PCP in July, 2023. Will request labs to be faxed to [facility].

## 2021-12-12 NOTE — Telephone Encounter (Signed)
Labs received from PCP and placed in chart under media. Scr 1.24 on 09/2021. Appropriate dose and refill sent to requested pharmacy.

## 2022-01-15 ENCOUNTER — Encounter: Payer: Self-pay | Admitting: Podiatry

## 2022-01-15 ENCOUNTER — Telehealth: Payer: Self-pay | Admitting: Podiatry

## 2022-01-15 NOTE — Telephone Encounter (Signed)
lvm to cb to r/s appt for 10-30 or 10-31 [place] oof

## 2022-01-23 ENCOUNTER — Ambulatory Visit: Payer: Medicare HMO | Admitting: Podiatry

## 2022-01-29 ENCOUNTER — Ambulatory Visit (INDEPENDENT_AMBULATORY_CARE_PROVIDER_SITE_OTHER): Payer: Medicare HMO | Admitting: Podiatry

## 2022-01-29 DIAGNOSIS — M79675 Pain in left toe(s): Secondary | ICD-10-CM

## 2022-01-29 DIAGNOSIS — B351 Tinea unguium: Secondary | ICD-10-CM

## 2022-01-29 DIAGNOSIS — M79674 Pain in right toe(s): Secondary | ICD-10-CM | POA: Diagnosis not present

## 2022-01-29 DIAGNOSIS — L84 Corns and callosities: Secondary | ICD-10-CM

## 2022-01-29 DIAGNOSIS — I739 Peripheral vascular disease, unspecified: Secondary | ICD-10-CM

## 2022-02-03 ENCOUNTER — Encounter: Payer: Self-pay | Admitting: Podiatry

## 2022-02-03 NOTE — Progress Notes (Signed)
  Subjective:  Patient ID: Randy Fuller, male    DOB: November 15, 1933,  MRN: [id number]  Austin Herd Bergeron presents to clinic today for at risk foot care with h/o CKD and callus(es) b/l lower extremities and painful thick toenails that are difficult to trim. Painful toenails interfere with ambulation. Aggravating factors include wearing enclosed shoe gear. Pain is relieved with periodic professional debridement. Painful calluses are aggravated when weightbearing with and without shoegear. Pain is relieved with periodic professional debridement.  Chief Complaint  Patient presents with   Nail Problem    Nail Trim Not Diabetic  PCP - Dr Sandi Mariscal    New problem(s): None.   PCP is Derinda Late, MD , and last visit was January 08, 2021.  Allergies  Allergen Reactions   Pork-Derived Products Swelling   Review of Systems: Negative except as noted in the HPI.  Objective: No changes noted in today's physical examination.  Kenzo Ozment is a pleasant 88 y.o. male WD, WN in NAD. AAO x 3. Vascular Capillary fill time to digits <3 b/l.  DP pulse(s) are faintly palpable b/l lower extremities. PT pulses diminished b/l. Pedal hair sparse. Lower extremity skin temperature gradient within normal limits. No pain with calf compression b/l. No edema noted b/l lower extremities. No cyanosis or clubbing noted.   Neurologic Protective sensation intact 5/5 intact bilaterally with 10g monofilament b/l. Vibratory sensation intact b/l. No clonus b/l.   Dermatologic Pedal skin is warm and supple b/l.  No open wounds b/l lower extremities. No interdigital macerations b/l lower extremities.   Toenails 1-5 b/l elongated, discolored, dystrophic, thickened, crumbly with subungual debris and tenderness to dorsal palpation.   Hyperkeratotic lesion(s) submet head 1 left foot and submet head 5 b/l.  No erythema, no edema, no drainage, no fluctuance.  Orthopedic: Normal muscle strength 5/5 to all lower extremity  muscle groups bilaterally. Patient ambulates independent of any assistive aids. Hallux hammertoe noted b/l LE.   Assessment/Plan: 1. Pain due to onychomycosis of toenails of both feet   2. Callus   3. PAD (peripheral artery disease) ([facility])     No orders of the defined types were placed in this encounter.   -Consent given for treatment as described below: -Examined patient. -Patient to continue soft, supportive shoe gear daily. -Toenails 1-5 b/l were debrided in length and girth with sterile nail nippers and dremel without iatrogenic bleeding.  -Callus(es) submet head 1 left foot and submet head 5 b/l pared utilizing sterile scalpel blade without complication or incident. Total number debrided =3. -Patient/POA to call should there be question/concern in the interim.   Return in about 3 months (around 05/01/2022).  Marzetta Board, DPM

## 2022-04-03 ENCOUNTER — Encounter: Payer: Self-pay | Admitting: Podiatry

## 2022-04-03 ENCOUNTER — Ambulatory Visit (INDEPENDENT_AMBULATORY_CARE_PROVIDER_SITE_OTHER): Payer: Medicare HMO | Admitting: Podiatry

## 2022-04-03 VITALS — BP 127/58

## 2022-04-03 DIAGNOSIS — B351 Tinea unguium: Secondary | ICD-10-CM

## 2022-04-03 DIAGNOSIS — M79675 Pain in left toe(s): Secondary | ICD-10-CM

## 2022-04-03 DIAGNOSIS — M79674 Pain in right toe(s): Secondary | ICD-10-CM | POA: Diagnosis not present

## 2022-04-03 DIAGNOSIS — L84 Corns and callosities: Secondary | ICD-10-CM

## 2022-04-03 DIAGNOSIS — I739 Peripheral vascular disease, unspecified: Secondary | ICD-10-CM

## 2022-04-03 NOTE — Progress Notes (Signed)
  Subjective:  Patient ID: Randy Fuller, male    DOB: 03-Jan-1934,  MRN: [id number]  Thierry Dobosz Brester presents to clinic today for {jgcomplaint:23593}  Chief Complaint  Patient presents with   Nail Problem    [facility] PCP-[facility] PCP VST-09/2021   New problem(s): None. {jgcomplaint:23593}  PCP is Derinda Late, MD.  Allergies  Allergen Reactions   Pork-Derived Products Swelling    Review of Systems: Negative except as noted in the HPI.  Objective: No changes noted in today's physical examination. There were no vitals filed for this visit. Onofre Gains is a pleasant 89 y.o. male WD, WN in NAD. AAO x 3.  Vascular Capillary fill time to digits <3 b/l.  DP pulse(s) are faintly palpable b/l lower extremities. PT pulses diminished b/l. Pedal hair sparse. Lower extremity skin temperature gradient within normal limits. No pain with calf compression b/l. No edema noted b/l lower extremities. No cyanosis or clubbing noted.   Neurologic Protective sensation intact 5/5 intact bilaterally with 10g monofilament b/l. Vibratory sensation intact b/l. No clonus b/l.   Dermatologic Pedal skin is warm and supple b/l.  No open wounds b/l lower extremities. No interdigital macerations b/l lower extremities.   Toenails 1-5 b/l elongated, discolored, dystrophic, thickened, crumbly with subungual debris and tenderness to dorsal palpation.   Hyperkeratotic lesion(s) submet head 1 left foot and submet head 5 b/l.  No erythema, no edema, no drainage, no fluctuance.  Orthopedic: Normal muscle strength 5/5 to all lower extremity muscle groups bilaterally. Patient ambulates independent of any assistive aids. Hallux hammertoe noted b/l LE.   Assessment/Plan: 1. Pain due to onychomycosis of toenails of both feet   2. Callus   3. PAD (peripheral artery disease) ([facility])     No orders of the defined types were placed in this encounter.   None {Jgplan:23602::"-Patient/POA to call should there be  question/concern in the interim."}   Return in about 3 months (around 07/03/2022).  Marzetta Board, DPM

## 2022-05-10 ENCOUNTER — Ambulatory Visit: Payer: Medicare HMO | Attending: Cardiovascular Disease | Admitting: Cardiovascular Disease

## 2022-05-10 ENCOUNTER — Encounter: Payer: Self-pay | Admitting: Cardiovascular Disease

## 2022-05-10 VITALS — BP 124/58 | HR 52 | Ht 71.0 in | Wt 152.2 lb

## 2022-05-10 DIAGNOSIS — I482 Chronic atrial fibrillation, unspecified: Secondary | ICD-10-CM | POA: Diagnosis not present

## 2022-05-10 DIAGNOSIS — E782 Mixed hyperlipidemia: Secondary | ICD-10-CM | POA: Diagnosis not present

## 2022-05-10 NOTE — Assessment & Plan Note (Signed)
History of hyperlipidemia on Crestor followed by his PCP.

## 2022-05-10 NOTE — Progress Notes (Signed)
05/10/2022 Randy Fuller   12-21-1933  [name]  Primary Physician Derinda Late, MD Primary Cardiologist: Lorretta Harp MD Randy Fuller, [place]  HPI:  Randy Fuller is a 89 y.o.    thin-appearing, married Randy Fuller male, father of 40, grandfather to 43 grandchildren who I saw in the office 01/30/2021.[name] He has a history of paroxysmal A-fib in the past, now chronic A-fib on Eliquis oral anticoagulation. His other problems include hyperlipidemia. He is entirely asymptomatic. Dr. Sandi Mariscal follows his lipid profile.his Crestor was increased from 20-40 mg a day by Dr. Sandi Mariscal   He had a Myoview ordered by Dr. Georgena Spurling 07/08/2017 which was nonischemic and low risk.  A 2D echocardiogram performed 10/10/2017 showed normal LV size and function with a speckled appearance in his myocardium suggestive of amyloid however he is completely asymptomatic and therefore I will not pursue a pyrophosphate scan or cardiac MRI.   He saw Kerin Ransom, PA-C in the office 06/18/2018 was sent complaints of fatigue.  His heart rate at that time was 38.  An event monitor showed A. fib with a slow ventricular response in the 30s and 40s on occasion.  He denies syncope or presyncope.   I referred him to Dr. Caryl Comes who evaluated him in the office on 08/17/2018 did not think he was a candidate for permanent transvenous pacing.    Since I saw him a year ago he is remained stable.  He denies chest pain, shortness of breath or dizziness.  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   Current Meds  Medication Sig   allopurinol (ZYLOPRIM) 300 MG tablet Take 450 mg by mouth daily.   amoxicillin (AMOXIL) 500 MG capsule Take by mouth 3 (three) times daily.   apixaban (ELIQUIS) 5 MG TABS tablet TAKE 1 TABLET TWICE A DAY   brimonidine (ALPHAGAN) 0.2 % ophthalmic solution SMARTSIG:In Eye(s)   Cholecalciferol 1000 UNITS capsule Take 1,000 Units by mouth daily.   ciclopirox (PENLAC) 8 %  solution Apply topically at bedtime. Apply over nail and surrounding skin. Apply daily over previous coat. After seven (7) days, may remove with alcohol and continue cycle.   dorzolamide-timolol (COSOPT) 22.3-6.8 MG/ML ophthalmic solution Place 1 drop into both eyes 2 (two) times daily.    finasteride (PROSCAR) 5 MG tablet Take 5 mg by mouth daily.   Fluocinonide 0.1 % CREA Apply topically.   latanoprost (XALATAN) 0.005 % ophthalmic solution Place 1 drop into both eyes daily.   latanoprost (XALATAN) 0.005 % ophthalmic solution Place 1 drop into both eyes nightly.   levothyroxine (SYNTHROID) 100 MCG tablet TAKE 1 TABLET BY MOUTH IN THE MORNING 30 MINS TO 1 HOUR BEFORE FOOD   levothyroxine (SYNTHROID) 75 MCG tablet Take 75 mcg by mouth daily.   losartan (COZAAR) 50 MG tablet Take 1 tablet (50 mg total) by mouth daily.   Multiple Vitamins-Minerals (CENTRUM SILVER PO) Take 1 tablet by mouth daily.    oxyCODONE (OXY IR/ROXICODONE) 5 MG immediate release tablet Take 5 mg by mouth every 4 (four) hours as needed.   rosuvastatin (CRESTOR) 40 MG tablet Take 1 tablet (40 mg total) by mouth daily.   sildenafil (VIAGRA) 100 MG tablet Take by mouth.   tamsulosin (FLOMAX) 0.4 MG CAPS capsule Take 2 capsules (0.8 mg total) by mouth daily.   triamcinolone ointment (KENALOG) 0.1 % Apply 1 application topically 2 (two) times daily as needed (DRY SKIN).  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  Cardiovascular: negative for chest pain, dyspnea on exertion, edema, orthopnea, palpitations, paroxysmal nocturnal dyspnea or shortness of breath Dermatological: negative for rash Respiratory: negative for cough or wheezing Urologic: negative for hematuria Abdominal: negative for nausea, vomiting, diarrhea, bright red blood per rectum, melena, or hematemesis Neurologic: negative for visual changes, syncope, or dizziness All other systems reviewed and are otherwise negative except as noted above.    Blood pressure (!) 124/58, pulse (!) 52, height 5' 11"$  (1.803 m), weight 152 lb 3.2 oz (69 kg), SpO2 94 %.  General appearance: alert and no distress Neck: no adenopathy, no carotid bruit, no JVD, supple, symmetrical, trachea midline, and thyroid not enlarged, symmetric, no tenderness/mass/nodules Lungs: clear to auscultation bilaterally Heart: irregularly irregular rhythm Extremities: extremities normal, atraumatic, no cyanosis or edema Pulses: 2+ and symmetric Skin: Skin color, texture, turgor normal. No rashes or lesions Neurologic: Grossly normal  EKG atrial fibrillation with ventricular response of 52 and inferolateral T wave inversion unchanged from prior EKGs.  I personally reviewed this EKG.  ASSESSMENT AND PLAN:   Atrial fibrillation, chronic History of persistent A-fib rate controlled on Eliquis oral anticoagulation.  Hyperlipidemia History of hyperlipidemia on Crestor followed by his PCP.     Lorretta Harp MD FACP,FACC,FAHA, [facility] 05/10/2022 9:51 AM

## 2022-05-10 NOTE — Assessment & Plan Note (Signed)
History of persistent A-fib rate controlled on Eliquis oral anticoagulation.

## 2022-05-10 NOTE — Patient Instructions (Signed)
Medication Instructions:  No changes *If you need a refill on your cardiac medications before your next appointment, please call your pharmacy*   Follow-Up: At [facility], you and your health needs are our priority.  As part of our continuing mission to provide you with exceptional heart care, we have created designated Provider Care Teams.  These Care Teams include your primary Cardiologist (physician) and Advanced Practice Providers (APPs -  Physician Assistants and Nurse Practitioners) who all work together to provide you with the care you need, when you need it.  We recommend signing up for the patient portal called "MyChart".  Sign up information is provided on this After Visit Summary.  MyChart is used to connect with patients for Virtual Visits (Telemedicine).  Patients are able to view lab/test results, encounter notes, upcoming appointments, etc.  Non-urgent messages can be sent to your provider as well.   To learn more about what you can do with MyChart, go to [email or web address].    Your next appointment:   1 year(s)  Provider:   Quay Burow, MD

## 2022-06-16 ENCOUNTER — Other Ambulatory Visit: Payer: Self-pay | Admitting: Cardiovascular Disease

## 2022-06-16 DIAGNOSIS — I482 Chronic atrial fibrillation, unspecified: Secondary | ICD-10-CM

## 2022-06-17 NOTE — Telephone Encounter (Signed)
Prescription refill request for Eliquis received. Indication: AF Last office visit: 05/10/22  Adora Fridge MD Scr: 1.18 on 07/07/20 Age:  89 Weight:  69kg  Based on above findings Eliquis 5mg  twice daily is the appropriate dose.  Refill approved x 1.  Past due for labs.  Message sent to Dr Kennon Holter nurse to request labs.

## 2022-06-19 ENCOUNTER — Telehealth: Payer: Self-pay | Admitting: Cardiovascular Disease

## 2022-06-19 DIAGNOSIS — I482 Chronic atrial fibrillation, unspecified: Secondary | ICD-10-CM

## 2022-06-19 NOTE — Telephone Encounter (Signed)
*  STAT* If patient is at the pharmacy, call can be transferred to refill team.   1. Which medications need to be refilled? (please list name of each medication and dose if known) apixaban (ELIQUIS) 5 MG TABS tablet TAKE 1 TABLET TWICE A DAY   2. Which pharmacy/location (including street and city if local pharmacy) is medication to be sent to?[facility] [place], [place] to [facility]   3. Do they need a 30 day or 90 day supply? 90 Day Supply

## 2022-06-19 NOTE — Telephone Encounter (Signed)
Prescription refill request for Eliquis received. Indication: Afib  Last office visit: 05/10/22 Gwenlyn Found)  Scr: 1.18 (07/07/20)  Age: 89 Weight: 69kg  Labs overdue. Refill for 90 day supply was sent to pharmacy on 06/17/22.   Called PCP to determine if labs have been drawn within the past year. Per PCP, labs were drawn in July, 2023. Requested labs to be faxed to [phone].

## 2022-06-20 MED ORDER — APIXABAN 5 MG PO TABS
5.0000 mg | ORAL_TABLET | Freq: Two times a day (BID) | ORAL | 1 refills | Status: DC
Start: 2022-06-20 — End: 2022-12-03

## 2022-06-20 NOTE — Telephone Encounter (Signed)
Labs received from PCP. Scr 1.24 on 10/13/21.  Appropriate dose. Refill sent.

## 2022-07-12 ENCOUNTER — Ambulatory Visit (INDEPENDENT_AMBULATORY_CARE_PROVIDER_SITE_OTHER): Payer: Medicare HMO | Admitting: Podiatry

## 2022-07-12 ENCOUNTER — Encounter: Payer: Self-pay | Admitting: Podiatry

## 2022-07-12 VITALS — BP 146/59

## 2022-07-12 DIAGNOSIS — M79675 Pain in left toe(s): Secondary | ICD-10-CM

## 2022-07-12 DIAGNOSIS — L84 Corns and callosities: Secondary | ICD-10-CM

## 2022-07-12 DIAGNOSIS — M79674 Pain in right toe(s): Secondary | ICD-10-CM | POA: Diagnosis not present

## 2022-07-12 DIAGNOSIS — I739 Peripheral vascular disease, unspecified: Secondary | ICD-10-CM

## 2022-07-12 DIAGNOSIS — B351 Tinea unguium: Secondary | ICD-10-CM | POA: Diagnosis not present

## 2022-07-12 NOTE — Progress Notes (Unsigned)
  Subjective:  Patient ID: Randy Fuller, male    DOB: 05-10-1933,  MRN: [id number]  Clement Hirschberg Wilczak presents to clinic today for {jgcomplaint:23593}  Chief Complaint  Patient presents with   Nail Problem    [facility] PCP-[facility] PCP VST-09/2021   New problem(s): None. {jgcomplaint:23593}  PCP is Mosetta Putt, MD.  Allergies  Allergen Reactions   Pork-Derived Products Swelling    Review of Systems: Negative except as noted in the HPI.  Objective: No changes noted in today's physical examination. Vitals:   07/12/22 0916  BP: (!) 146/59   Randy Fuller is a pleasant 89 y.o. male {jgbodyhabitus:24098} AAO x 3.  Vascular Capillary fill time to digits <3 b/l.  DP pulse(s) are faintly palpable b/l lower extremities. PT pulses diminished b/l. Pedal hair sparse. Lower extremity skin temperature gradient within normal limits. No pain with calf compression b/l. No edema noted b/l lower extremities. No cyanosis or clubbing noted.   Neurologic Protective sensation intact 5/5 intact bilaterally with 10g monofilament b/l. Vibratory sensation intact b/l. No clonus b/l.   Dermatologic Pedal skin is warm and supple b/l.  No open wounds b/l lower extremities. No interdigital macerations b/l lower extremities.   Toenails 1-5 b/l elongated, discolored, dystrophic, thickened, crumbly with subungual debris and tenderness to dorsal palpation.   Hyperkeratotic lesion(s) submet head 1 left foot and submet head 5 b/l.  No erythema, no edema, no drainage, no fluctuance.  Orthopedic: Normal muscle strength 5/5 to all lower extremity muscle groups bilaterally. Patient ambulates independent of any assistive aids. Hallux hammertoe noted b/l LE.   Assessment/Plan: 1. Pain due to onychomycosis of toenails of both feet   2. Callus   3. PAD (peripheral artery disease)     No orders of the defined types were placed in this encounter.   None {Jgplan:23602::"-Patient/POA to call should there be  question/concern in the interim."}   Return in about 3 months (around 10/11/2022).  Freddie Breech, DPM

## 2022-11-20 ENCOUNTER — Ambulatory Visit (INDEPENDENT_AMBULATORY_CARE_PROVIDER_SITE_OTHER): Payer: Medicare HMO | Admitting: Podiatry

## 2022-11-20 ENCOUNTER — Encounter: Payer: Self-pay | Admitting: Podiatry

## 2022-11-20 DIAGNOSIS — L84 Corns and callosities: Secondary | ICD-10-CM | POA: Diagnosis not present

## 2022-11-20 DIAGNOSIS — I739 Peripheral vascular disease, unspecified: Secondary | ICD-10-CM

## 2022-11-20 DIAGNOSIS — M79674 Pain in right toe(s): Secondary | ICD-10-CM

## 2022-11-20 DIAGNOSIS — M79675 Pain in left toe(s): Secondary | ICD-10-CM | POA: Diagnosis not present

## 2022-11-20 DIAGNOSIS — B351 Tinea unguium: Secondary | ICD-10-CM | POA: Diagnosis not present

## 2022-11-20 NOTE — Progress Notes (Signed)
  Subjective:  Patient ID: Randy Fuller, male    DOB: 1933/07/21,  MRN: [id number]  Randy Fuller presents to clinic today for at risk foot care. Patient has h/o PAD and callus(es) of both feet and painful thick toenails that are difficult to trim. Painful toenails interfere with ambulation. Aggravating factors include wearing enclosed shoe gear. Pain is relieved with periodic professional debridement. Painful calluses are aggravated when weightbearing with and without shoegear. Pain is relieved with periodic professional debridement.   New problem(s): None.   PCP is Mosetta Putt, MD. Randy Fuller July, 2024.  Allergies  Allergen Reactions   Pork-Derived Products Swelling    Review of Systems: Negative except as noted in the HPI.  Objective: No changes noted in today's physical examination. There were no vitals filed for this visit. Randy Fuller is a pleasant 89 y.o. male WD, WN in NAD. AAO x 3.  Vascular Examination: CFT <3 seconds b/l. DP pulses faintly palpable b/l. PT pulses nonpalpable b/l. Digital hair absent. Skin temperature gradient warm to warm b/l. No pain with calf compression. No ischemia or gangrene. No cyanosis or clubbing noted b/l. No edema noted b/l LE.   Neurological Examination: Sensation grossly intact b/l with 10 gram monofilament. Vibratory sensation intact b/l.   Dermatological Examination: Pedal skin warm and supple b/l. No open wounds b/l. No interdigital macerations. Toenails 1-5 b/l thick, discolored, elongated with subungual debris and pain on dorsal palpation.  Hyperkeratotic lesion(s) submet head 5 left foot and submet head 5 right foot.  No erythema, no edema, no drainage, no fluctuance.  Musculoskeletal Examination: Muscle strength 5/5 to all lower extremity muscle groups bilaterally. Hallux hammertoe noted b/l LE. Patient ambulates independent of any assistive aids.  Radiographs: None  Assessment/Plan: 1. Pain due to onychomycosis of  toenails of both feet   2. Callus   3. PAD (peripheral artery disease) (HCC)     -Patient was evaluated and treated. All patient's and/or POA's questions/concerns answered on today's visit. -Continue supportive shoe gear daily. -Mycotic toenails 1-5 bilaterally were debrided in length and girth with sterile nail nippers and dremel without incident. -Callus(es) submet head 5 b/l pared utilizing sterile scalpel blade without complication or incident. Total number debrided =2. -Patient/POA to call should there be question/concern in the interim.   Return in about 3 months (around 02/20/2023).  Freddie Breech, DPM

## 2022-11-30 ENCOUNTER — Other Ambulatory Visit: Payer: Self-pay | Admitting: Cardiovascular Disease

## 2022-11-30 DIAGNOSIS — I482 Chronic atrial fibrillation, unspecified: Secondary | ICD-10-CM

## 2022-12-03 NOTE — Telephone Encounter (Addendum)
Eliquis 5mg  refill request received. Patient is 89 years old, weight-69kg, Crea-1.24 on 10/06/21 from scanned labs-NEEDS UPDATED LABS, Diagnosis-Afib, and last seen by Dr. Allyson Sabal on 05/10/22. Dose is appropriate based on dosing criteria.    PT NEEDS UPDATED FROM. WILL CALL PCP TO SEE IF HE HAD ANY DONE RECENTLY.  Called PCP office and they will fax over recent blood work from July 2024.

## 2022-12-03 NOTE — Telephone Encounter (Signed)
Labs received from PCP via fax.  Scr 1.32 on 10/09/22.   Appropriate dose. Refill sent.

## 2023-02-19 ENCOUNTER — Encounter: Payer: Self-pay | Admitting: Podiatry

## 2023-02-19 ENCOUNTER — Ambulatory Visit (INDEPENDENT_AMBULATORY_CARE_PROVIDER_SITE_OTHER): Payer: Medicare HMO | Admitting: Podiatry

## 2023-02-19 DIAGNOSIS — I739 Peripheral vascular disease, unspecified: Secondary | ICD-10-CM

## 2023-02-19 DIAGNOSIS — M79675 Pain in left toe(s): Secondary | ICD-10-CM | POA: Diagnosis not present

## 2023-02-19 DIAGNOSIS — M79674 Pain in right toe(s): Secondary | ICD-10-CM

## 2023-02-19 DIAGNOSIS — L84 Corns and callosities: Secondary | ICD-10-CM

## 2023-02-19 DIAGNOSIS — B351 Tinea unguium: Secondary | ICD-10-CM

## 2023-02-19 NOTE — Progress Notes (Signed)
  Subjective:  Patient ID: Randy Fuller, male    DOB: 1933-10-05,   MRN: [id number]  No chief complaint on file.   89 y.o. male presents for concern of thickened elongated and painful nails that are difficult to trim. Requesting to have them trimmed today.  History of PAD and at risk for foot care.   PCP:  Mosetta Putt, MD    . Denies any other pedal complaints. Denies n/v/f/c.   Past Medical History:  Diagnosis Date   Anemia    Arthritis    Atrial fibrillation (HCC)    on Coumadin but on hold for surgery bridging with lovenox   Atrial fibrillation (HCC)    also takes Digoxin daily   Benign prostate hyperplasia    Bilateral pneumonia    Bruises easily    d/t being on Coumadin   Cataract    Colon polyps    2003   Diverticulosis    Dry skin    uses Kenalog cream as needed   ED (erectile dysfunction)    Elevated LFTs    Enlarged prostate    takes Flomax daily   Esophageal stricture    Esophagitis    Fatty liver    GERD (gastroesophageal reflux disease)    takes Prilosec daily   Glaucoma    Gout    just stopped medication this week per Medical Md(was on Allopurinol)   Hearing loss    Hernia    Hyperlipidemia    takes Atorvastatin every other day   Impaired glucose tolerance    Internal hemorrhoids    Irregular heart beat    Nocturia    Vitamin D deficiency     Objective:  Physical Exam: Vascular: DP/PT pulses 2/4 bilateral. CFT <3 seconds. Absent hair growth on digits. Edema noted to bilateral lower extremities. Xerosis noted bilaterally.  Skin. No lacerations or abrasions bilateral feet. Nails 1-5 bilateral  are thickened discolored and elongated with subungual debris. Hyperkeratotic lesion noted plantar first and fifth metatarsals bilateral Musculoskeletal: MMT 5/5 bilateral lower extremities in DF, PF, Inversion and Eversion. Deceased ROM in DF of ankle joint.  Neurological: Sensation intact to light touch. Protective sensation diminished bilateral.     Assessment:   1. Pain due to onychomycosis of toenails of both feet   2. Callus   3. PAD (peripheral artery disease) (HCC)      Plan:  Patient was evaluated and treated and all questions answered. -Mechanically debrided all nails 1-5 bilateral using sterile nail nipper and filed with dremel without incident  -Hyperkeratotic tissue debrided without incident with chisel x 4.  -Answered all patient questions -Patient to return  in 3 months for at risk foot care -Patient advised to call the office if any problems or questions arise in the meantime.   Louann Sjogren, DPM

## 2023-05-12 ENCOUNTER — Ambulatory Visit: Payer: Medicare HMO | Attending: Cardiovascular Disease | Admitting: Cardiovascular Disease

## 2023-05-12 ENCOUNTER — Encounter: Payer: Self-pay | Admitting: Cardiovascular Disease

## 2023-05-12 VITALS — BP 100/50 | HR 49 | Ht 71.0 in | Wt 151.0 lb

## 2023-05-12 DIAGNOSIS — E782 Mixed hyperlipidemia: Secondary | ICD-10-CM | POA: Diagnosis not present

## 2023-05-12 DIAGNOSIS — I482 Chronic atrial fibrillation, unspecified: Secondary | ICD-10-CM

## 2023-05-12 DIAGNOSIS — I517 Cardiomegaly: Secondary | ICD-10-CM | POA: Insufficient documentation

## 2023-05-12 MED ORDER — APIXABAN 5 MG PO TABS: 5.0000 mg | ORAL_TABLET | Freq: Two times a day (BID) | ORAL | 1 refills | Status: DC

## 2023-05-12 NOTE — Assessment & Plan Note (Signed)
History of hyperlipidemia on high-dose rosuvastatin followed by his PCP

## 2023-05-12 NOTE — Progress Notes (Signed)
 05/12/2023 Randy Fuller   [birth year]  [id number]  Primary Physician Randy Chambers, MD Primary Cardiologist: Randy Leigh MD FACP, Randy Fuller, Randy Fuller, [place]  HPI:  Randy Fuller is a 89 y.o.  thin-appearing, married [place] American male, father of 3, grandfather to 63 grandchildren, and great grandfather to 6 great grandchildren who I saw in the office 05/10/2022.Randy Fuller He has a history of paroxysmal A-fib in the past, now chronic A-fib on Eliquis  oral anticoagulation. His other problems include hyperlipidemia. He is entirely asymptomatic. Dr. Vela Fuller follows his lipid profile.his Crestor  was increased from 20-40 mg a day by Dr. Vela Fuller   He had a Myoview  ordered by Dr. Diona Fuller 07/08/2017 which was nonischemic and low risk.  A 2D echocardiogram performed 10/10/2017 showed normal LV size and function with a speckled appearance in his myocardium suggestive of amyloid however he is completely asymptomatic and therefore I will not pursue a pyrophosphate scan or cardiac MRI.   He saw Randy Magnuson, PA-C in the office 06/18/2018 was sent complaints of fatigue.  His heart rate at that time was 38.  An event monitor showed A. fib with a slow ventricular response in the 30s and 40s on occasion.  He denies syncope or presyncope.   I referred him to Dr. Rodolfo Fuller who evaluated him in the office on 08/17/2018 did not think he was a candidate for permanent transvenous pacing.    Since I saw him a year ago he is remained stable.  He denies chest pain, shortness of breath or dizziness.  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   Current Meds  Medication Sig   allopurinol (ZYLOPRIM) 300 MG tablet Take 450 mg by mouth daily.   amoxicillin (AMOXIL) 500 MG capsule Take by mouth 3 (three) times daily.   brimonidine (ALPHAGAN) 0.2 % ophthalmic solution SMARTSIG:In Eye(s)   Cholecalciferol 1000 UNITS capsule Take 1,000 Units by  mouth daily.   dorzolamide -timolol  (COSOPT ) 22.3-6.8 MG/ML ophthalmic solution Place 1 drop into both eyes 2 (two) times daily.    finasteride  (PROSCAR ) 5 MG tablet Take 5 mg by mouth daily.   Fluocinonide 0.1 % CREA Apply topically.   latanoprost  (XALATAN ) 0.005 % ophthalmic solution Place 1 drop into both eyes daily.   latanoprost  (XALATAN ) 0.005 % ophthalmic solution Place 1 drop into both eyes nightly.   levothyroxine (SYNTHROID) 100 MCG tablet TAKE 1 TABLET BY MOUTH IN THE MORNING 30 MINS TO 1 HOUR BEFORE FOOD   levothyroxine (SYNTHROID) 75 MCG tablet Take 75 mcg by mouth daily.   losartan  (COZAAR ) 50 MG tablet Take 1 tablet (50 mg total) by mouth daily.   Multiple Vitamins-Minerals (CENTRUM SILVER PO) Take 1 tablet by mouth daily.    oxyCODONE  (OXY IR/ROXICODONE ) 5 MG immediate release tablet Take 5 mg by mouth every 4 (four) hours as needed.   rosuvastatin  (CRESTOR ) 40 MG tablet Take 1 tablet (40 mg total) by mouth daily.   sildenafil (VIAGRA) 100 MG tablet Take by mouth.   tamsulosin  (FLOMAX ) 0.4 MG CAPS capsule Take 2 capsules (0.8 mg total) by mouth daily.   triamcinolone ointment (KENALOG) 0.1 % Apply 1 application topically 2 (two) times daily as needed (DRY SKIN).    [DISCONTINUED] ELIQUIS  5 MG TABS tablet TAKE 1 TABLET TWICE A DAY     Allergies  Allergen Reactions   Pork-Derived Products Swelling    Social History  Socioeconomic History   Marital status: Married    Spouse name: Not on file   Number of children: Not on file   Years of education: Not on file   Highest education level: Not on file  Occupational History   Occupation: Chartered certified accountant retired    Associate Professor: RETIRED  Tobacco Use   Smoking status: Never   Smokeless tobacco: Never  Substance and Sexual Activity   Alcohol use: No   Drug use: No   Sexual activity: Not on file  Other Topics Concern   Not on file  Social History Narrative   Not on file   Social Drivers of Health   Financial Resource Strain:  Not on file  Food Insecurity: Not on file  Transportation Needs: Not on file  Physical Activity: Not on file  Stress: Not on file  Social Connections: Not on file  Intimate Partner Violence: Not on file     Review of Systems: General: negative for chills, fever, night sweats or weight changes.  Cardiovascular: negative for chest pain, dyspnea on exertion, edema, orthopnea, palpitations, paroxysmal nocturnal dyspnea or shortness of breath Dermatological: negative for rash Respiratory: negative for cough or wheezing Urologic: negative for hematuria Abdominal: negative for nausea, vomiting, diarrhea, bright red blood per rectum, melena, or hematemesis Neurologic: negative for visual changes, syncope, or dizziness All other systems reviewed and are otherwise negative except as noted above.    Blood pressure (!) 100/50, pulse (!) 49, height 5\' 11"  (1.803 m), weight 151 lb (68.5 kg), SpO2 94%.  General appearance: alert and no distress Neck: no adenopathy, no carotid bruit, no JVD, supple, symmetrical, trachea midline, and thyroid  not enlarged, symmetric, no tenderness/mass/nodules Lungs: clear to auscultation bilaterally Heart: irregularly irregular rhythm Extremities: extremities normal, atraumatic, no cyanosis or edema Pulses: 2+ and symmetric Skin: Skin color, texture, turgor normal. No rashes or lesions Neurologic: Grossly normal  EKG EKG Interpretation Date/Time:  Monday May 12 2023 08:57:52 EST Ventricular Rate:  49 PR Interval:    QRS Duration:  94 QT Interval:  444 QTC Calculation: 401 R Axis:   0  Text Interpretation: Atrial fibrillation with slow ventricular response Incomplete right bundle branch block Minimal voltage criteria for LVH, may be normal variant ( Sokolow-Lyon ) Possible Anteroseptal infarct , age undetermined ST & T wave abnormality, consider lateral ischemia When compared with ECG of 30-Jul-2018 18:03, Significant changes have occurred Confirmed by  Randy Fuller [phone]) on 05/12/2023 9:07:28 AM    ASSESSMENT AND PLAN:   Atrial fibrillation, chronic History of persistent A-fib with slow ventricular response on Eliquis  oral anticoagulation.  Hyperlipidemia History of hyperlipidemia on high-dose rosuvastatin  followed by his PCP.  Asymmetric septal hypertrophy 2D echo performed 10/10/2017 showed moderate basal asymmetric septal hypertrophy.  He also had severe biatrial enlargement with normal LV systolic function.     Randy Leigh MD FACP,FACC,FAHA, [facility] 05/12/2023 9:14 AM

## 2023-05-12 NOTE — Assessment & Plan Note (Signed)
 2D echo performed 10/10/2017 showed moderate basal asymmetric septal hypertrophy.  He also had severe biatrial enlargement with normal LV systolic function.

## 2023-05-12 NOTE — Patient Instructions (Signed)

## 2023-05-12 NOTE — Assessment & Plan Note (Signed)
 History of persistent A-fib with slow ventricular response on Eliquis  oral anticoagulation.

## 2023-05-27 ENCOUNTER — Ambulatory Visit (INDEPENDENT_AMBULATORY_CARE_PROVIDER_SITE_OTHER): Payer: Medicare HMO | Admitting: Podiatry

## 2023-05-27 ENCOUNTER — Encounter: Payer: Self-pay | Admitting: Podiatry

## 2023-05-27 VITALS — Ht 71.0 in | Wt 151.0 lb

## 2023-05-27 DIAGNOSIS — B351 Tinea unguium: Secondary | ICD-10-CM

## 2023-05-27 DIAGNOSIS — M79674 Pain in right toe(s): Secondary | ICD-10-CM

## 2023-05-27 DIAGNOSIS — L84 Corns and callosities: Secondary | ICD-10-CM

## 2023-05-27 DIAGNOSIS — M79675 Pain in left toe(s): Secondary | ICD-10-CM | POA: Diagnosis not present

## 2023-05-27 DIAGNOSIS — I739 Peripheral vascular disease, unspecified: Secondary | ICD-10-CM

## 2023-05-29 NOTE — Progress Notes (Signed)
  Subjective:  Patient ID: Randy Fuller, male    DOB: 04-28-1933,  MRN: [id number]  Eisen Robenson Hanback presents to clinic today for at risk foot care. Patient has h/o PAD and callus(es) b/l feet and painful mycotic toenails that are difficult to trim. Painful toenails interfere with ambulation. Aggravating factors include wearing enclosed shoe gear. Pain is relieved with periodic professional debridement. Painful calluses are aggravated when weightbearing with and without shoegear. Pain is relieved with periodic professional debridement.  Chief Complaint  Patient presents with   Nail Problem    Pt is here for [facility] PCP is Dr Duaine Dredge and LOV was months ago.   New problem(s): None.   PCP is Mosetta Putt, MD.  Allergies  Allergen Reactions   Pork-Derived Products Swelling    Review of Systems: Negative except as noted in the HPI.  Objective:  There were no vitals filed for this visit. Randy Fuller is a pleasant 89 y.o. male thin build in NAD. AAO x 3.  Vascular Examination: CFT <3 seconds b/l. DP pulses faintly palpable b/l. PT pulses nonpalpable b/l. Digital hair absent. Skin temperature gradient warm to warm b/l. No pain with calf compression. No ischemia or gangrene. No cyanosis or clubbing noted b/l. No edema noted b/l LE.   Neurological Examination: Sensation grossly intact b/l with 10 gram monofilament. Vibratory sensation intact b/l.   Dermatological Examination: Pedal skin warm and supple b/l. No open wounds b/l. No interdigital macerations. Toenails 1-5 b/l thick, discolored, elongated with subungual debris and pain on dorsal palpation.  Hyperkeratotic lesion(s) submet head 1 b/l, submet head 5 left foot and submet head 5 right foot.  No erythema, no edema, no drainage, no fluctuance.  Musculoskeletal Examination: Muscle strength 5/5 to all lower extremity muscle groups bilaterally. Hallux hammertoe noted b/l LE. Patient ambulates independent of any assistive  aids.  Radiographs: None Assessment/Plan: 1. Pain due to onychomycosis of toenails of both feet   2. Callus   3. PAD (peripheral artery disease) (HCC)     -Consent given for treatment as described below: -Examined patient. -Patient to continue soft, supportive shoe gear daily. -Toenails 1-5 b/l were debrided in length and girth with sterile nail nippers and dremel without iatrogenic bleeding.  -Callus(es) submet head 1 b/l and submet head 5 b/l pared utilizing sharp debridement with sterile blade without complication or incident. Total number debrided =4. -Patient/POA to call should there be question/concern in the interim.   Return in about 4 months (around 09/24/2023).  Freddie Breech, DPM      [place] LOCATION: [address], [place] [address]                   Office [phone]   [facility] LOCATION: [address] [place], [place] [address] Office [phone]

## 2023-08-26 ENCOUNTER — Ambulatory Visit: Payer: Medicare HMO | Admitting: Podiatry

## 2023-09-02 ENCOUNTER — Encounter: Payer: Self-pay | Admitting: Podiatry

## 2023-09-02 ENCOUNTER — Ambulatory Visit (INDEPENDENT_AMBULATORY_CARE_PROVIDER_SITE_OTHER): Payer: Medicare HMO | Admitting: Podiatry

## 2023-09-02 DIAGNOSIS — I739 Peripheral vascular disease, unspecified: Secondary | ICD-10-CM

## 2023-09-02 DIAGNOSIS — L84 Corns and callosities: Secondary | ICD-10-CM | POA: Diagnosis not present

## 2023-09-02 DIAGNOSIS — M79674 Pain in right toe(s): Secondary | ICD-10-CM | POA: Diagnosis not present

## 2023-09-02 DIAGNOSIS — B351 Tinea unguium: Secondary | ICD-10-CM

## 2023-09-02 DIAGNOSIS — M79675 Pain in left toe(s): Secondary | ICD-10-CM

## 2023-09-07 NOTE — Progress Notes (Signed)
  Subjective:  Patient ID: Randy Fuller, male    DOB: [birth year],  MRN: [id number]  Killian Schwer Colomb presents to clinic today for at risk foot care. Patient has h/o PAD and callus(es) b/l lower extremities and painful thick toenails that are difficult to trim. Painful toenails interfere with ambulation. Aggravating factors include wearing enclosed shoe gear. Pain is relieved with periodic professional debridement. Painful calluses are aggravated when weightbearing with and without shoegear. Pain is relieved with periodic professional debridement.  Chief Complaint  Patient presents with   Nail Problem    "She cuts my toenails."   New problem(s): None.   PCP is Candise Chambers, MD.  Allergies  Allergen Reactions   Pork-Derived Products Swelling    Review of Systems: Negative except as noted in the HPI.  Objective: No changes noted in today's physical examination. There were no vitals filed for this visit. Randy Fuller is a pleasant 89 y.o. male WD, WN in NAD. AAO x 3.  Vascular Examination: CFT <3 seconds b/l. DP pulses faintly palpable b/l. PT pulses nonpalpable b/l. Digital hair absent. No edema b/l. Skin temperature gradient warm to warm b/l. No pain with calf compression. No ischemia or gangrene. No cyanosis or clubbing noted b/l.    Neurological Examination: Sensation grossly intact b/l with 10 gram monofilament.   Dermatological Examination: Pedal skin warm and supple b/l. No open wounds b/l. No interdigital macerations. Toenails 1-5 b/l thick, discolored, elongated with subungual debris and pain on dorsal palpation.   Hyperkeratotic lesion(s) submet head 1 left foot and submet head 5 b/l.  No erythema, no edema, no drainage, no fluctuance.  Musculoskeletal Examination: Muscle strength 5/5 to all lower extremity muscle groups bilaterally. Hammertoe(s) bilateral great toes.  Radiographs: None  Assessment/Plan: 1. Pain due to onychomycosis of toenails of both  feet   2. Callus   3. PAD (peripheral artery disease) (HCC)    Patient was evaluated and treated. All patient's and/or POA's questions/concerns addressed on today's visit. Mycotic toenails 1-5 debrided in length and girth without incident. Callus(es) submet head 1 left foot and submet head 5 b/l pared with sharp debridement without incident. Continue soft, supportive shoe gear daily. Report any pedal injuries to medical professional. Call office if there are any questions/concerns. -Patient/POA to call should there be question/concern in the interim.   Return in about 3 months (around 12/03/2023).  Luella Sager, DPM      [place] LOCATION: [address], [place] [address]                   Office [phone]   [facility] LOCATION: [address] [place], [place] [address] Office [phone]

## 2023-09-30 ENCOUNTER — Ambulatory Visit: Payer: Medicare HMO | Admitting: Podiatry

## 2023-11-05 ENCOUNTER — Other Ambulatory Visit: Payer: Self-pay | Admitting: Cardiovascular Disease

## 2023-11-05 DIAGNOSIS — I482 Chronic atrial fibrillation, unspecified: Secondary | ICD-10-CM

## 2023-11-05 NOTE — Telephone Encounter (Signed)
 Updated labs received from PCP. Scr 1.38 on 10/08/23.  Appropriate dose. Refill sent.

## 2023-11-05 NOTE — Telephone Encounter (Signed)
 Labs received from PCP. Scr 1.32 on 10/09/22 via fax from PCP.

## 2023-11-05 NOTE — Telephone Encounter (Addendum)
 Eliquis  5mg  refill request received. Patient is 89 years old, weight-68.5kg, Crea-1.32 via faxed labs from PCP, Diagnosis-Afib, and last seen by Dr. Court on 05/12/23. Dose is appropriate based on dosing criteria.   Called PCP regarding labs and they will fax  They faxed over the labs from last year and not his 2025, so they will refax the current 2025 labs.

## 2023-12-10 ENCOUNTER — Ambulatory Visit: Admitting: Podiatry

## 2023-12-10 DIAGNOSIS — B351 Tinea unguium: Secondary | ICD-10-CM | POA: Diagnosis not present

## 2023-12-10 DIAGNOSIS — M79674 Pain in right toe(s): Secondary | ICD-10-CM

## 2023-12-10 DIAGNOSIS — I739 Peripheral vascular disease, unspecified: Secondary | ICD-10-CM

## 2023-12-10 DIAGNOSIS — L84 Corns and callosities: Secondary | ICD-10-CM | POA: Diagnosis not present

## 2023-12-10 DIAGNOSIS — M79675 Pain in left toe(s): Secondary | ICD-10-CM | POA: Diagnosis not present

## 2023-12-14 ENCOUNTER — Encounter: Payer: Self-pay | Admitting: Podiatry

## 2023-12-14 NOTE — Progress Notes (Signed)
  Subjective:  Patient ID: Randy Fuller, male    DOB: 21-Jan-1934,  MRN: [id number]  Randy Fuller presents to clinic today for at risk foot care. Patient has h/o PAD and callus(es) of both feet and painful mycotic toenails that are difficult to trim. Painful toenails interfere with ambulation. Aggravating factors include wearing enclosed shoe gear. Pain is relieved with periodic professional debridement. Painful calluses are aggravated when weightbearing with and without shoegear. Pain is relieved with periodic professional debridement.  Chief Complaint  Patient presents with   RFC    Diabetes:no Last A1c: Last FSBS: Anticoagulant:Eliquis  PCP Name & Last Visit: Maude Sprague, MD/09-2023   New problem(s): None.   PCP is Sprague Maude, MD.  Allergies  Allergen Reactions   Pork-Derived Products Swelling    Review of Systems: Negative except as noted in the HPI.  Objective:  There were no vitals filed for this visit. Randy Fuller is a pleasant 89 y.o. male WD, WN in NAD. AAO x 3.  Vascular Examination: CFT <3 seconds b/l. DP pulses faintly palpable b/l. PT pulses nonpalpable b/l. Digital hair absent. No edema b/l. Skin temperature gradient warm to warm b/l. No pain with calf compression. No ischemia or gangrene. No cyanosis or clubbing noted b/l.    Neurological Examination: Sensation grossly intact b/l with 10 gram monofilament.   Dermatological Examination: Pedal skin warm and supple b/l. No open wounds b/l. No interdigital macerations. Toenails 1-5 b/l thick, discolored, elongated with subungual debris and pain on dorsal palpation.   Hyperkeratotic lesion(s) posterolateral heel right foot and submet head 5 b/l.  No erythema, no edema, no drainage, no fluctuance.  Musculoskeletal Examination: Muscle strength 5/5 to all lower extremity muscle groups bilaterally. Hammertoe(s) bilateral great toes.  Radiographs: None  Assessment/Plan: 1. Pain due to  onychomycosis of toenails of both feet   2. Callus   3. PAD (peripheral artery disease) (HCC)   Consent given for treatment. Patient examined. All patient's and/or POA's questions/concerns addressed on today's visit. Mycotic toenails 1-5 debrided in length and girth without incident. Callus(es) right heel and submet head 5 b/l pared with sharp debridement without incident.Continue soft, supportive shoe gear daily. Report any pedal injuries to medical professional. Call office if there are any quesitons/concerns. -Patient/POA to call should there be question/concern in the interim.   Return in about 3 months (around 03/10/2024).  Randy Fuller, DPM      [place] LOCATION: [address], [place] [address]                   Office [phone]   [facility] LOCATION: [address] [place], [place] [address] Office [phone]

## 2023-12-23 ENCOUNTER — Ambulatory Visit
Admission: RE | Admit: 2023-12-23 | Discharge: 2023-12-23 | Disposition: A | Discharge status: Home / Self Care | Source: Ambulatory Visit | Attending: Family Medicine | Admitting: Family Medicine

## 2023-12-23 ENCOUNTER — Other Ambulatory Visit: Payer: Self-pay | Admitting: Family Medicine

## 2023-12-23 DIAGNOSIS — M25511 Pain in right shoulder: Secondary | ICD-10-CM

## 2024-03-06 ENCOUNTER — Ambulatory Visit (HOSPITAL_COMMUNITY)
Admission: EM | Admit: 2024-03-06 | Discharge: 2024-03-06 | Disposition: A | Discharge status: Home / Self Care | Attending: Physician Assistant | Admitting: Physician Assistant

## 2024-03-06 ENCOUNTER — Ambulatory Visit (HOSPITAL_COMMUNITY)

## 2024-03-06 ENCOUNTER — Other Ambulatory Visit: Payer: Self-pay

## 2024-03-06 ENCOUNTER — Encounter (HOSPITAL_COMMUNITY): Payer: Self-pay | Admitting: *Deleted

## 2024-03-06 DIAGNOSIS — R051 Acute cough: Secondary | ICD-10-CM | POA: Diagnosis not present

## 2024-03-06 LAB — POC SOFIA SARS ANTIGEN FIA: SARS Coronavirus 2 Ag: NEGATIVE

## 2024-03-06 MED ORDER — BENZONATATE 100 MG PO CAPS: 100.0000 mg | ORAL_CAPSULE | Freq: Three times a day (TID) | ORAL | 0 refills | Status: AC

## 2024-03-06 NOTE — Discharge Instructions (Addendum)
 Follow up with PCP Return or go to ED if symptoms worsen  Initial read of chest xray without acute abnormalities, pending radiologist overread.  I will contact you to discuss imaging only if radiologist noted a finding that we did not discuss.

## 2024-03-06 NOTE — ED Triage Notes (Addendum)
 C/O slight HA, nasal congestion, rhinorrhea, sore throat, and deep cough onset 2 days ago. No known fevers. States he sometimes feels SOB.  SaO2 91-93%RA.

## 2024-03-06 NOTE — ED Provider Notes (Signed)
 [facility]    CSN: [id number] Arrival date & time: 03/06/24  0830      History   Chief Complaint Chief Complaint  Patient presents with   Cough   Nasal Congestion    HPI Randy Fuller is a 89 y.o. male.   Patient here for evaluation of cough x 2 days.  He reports he had pneumonia previously and this feels somewhat similar to that.  He is also concerned he may have COVID.  No sick contacts, no ibuprofen or Tylenol  today.    Past Medical History:  Diagnosis Date   Anemia    Arthritis    Atrial fibrillation (HCC)    on Coumadin  but on hold for surgery bridging with lovenox    Atrial fibrillation (HCC)    also takes Digoxin  daily   Benign prostate hyperplasia    Bilateral pneumonia    Bruises easily    d/t being on Coumadin    Cataract    Colon polyps    2003   Diverticulosis    Dry skin    uses Kenalog cream as needed   ED (erectile dysfunction)    Elevated LFTs    Enlarged prostate    takes Flomax  daily   Esophageal stricture    Esophagitis    Fatty liver    GERD (gastroesophageal reflux disease)    takes Prilosec daily   Glaucoma    Gout    just stopped medication this week per Medical Md(was on Allopurinol)   Hearing loss    Hernia    Hyperlipidemia    takes Atorvastatin  every other day   Impaired glucose tolerance    Internal hemorrhoids    Irregular heart beat    Nocturia    Vitamin D deficiency     Patient Active Problem List   Diagnosis Date Noted   Asymmetric septal hypertrophy 05/12/2023   Chronic atrial fibrillation (HCC)    Small bowel obstruction (HCC) 07/30/2018   Abnormal echocardiogram 05/20/2018   Vitamin D deficiency    Nocturia    Internal hemorrhoids    Impaired glucose tolerance    Hyperlipidemia    Hearing loss    Gout    Glaucoma    GERD (gastroesophageal reflux disease)    Fatty liver    Esophagitis    Enlarged prostate    Elevated LFTs    ED (erectile dysfunction)    Dry skin    Diverticulosis     Colon polyps    Cataract    Bruises easily    Bilateral pneumonia    Benign prostate hyperplasia    Arthritis    Anemia    Atrial fibrillation, chronic 10/09/2016   Vitreous syneresis of both eyes 08/22/2016   Esophageal stricture    Macular edema 10/08/2012   Chronic anticoagulation 08/13/2012   Branch retinal vein occlusion with macular edema of right eye (HCC) 06/01/2012   Pseudophakia 10/30/2011   Primary open angle glaucoma (POAG) 03/01/2011   Benign neoplasm of colon 06/21/2003    Past Surgical History:  Procedure Laterality Date   CATARACT EXTRACTION     Both eyes   DOPPLER ECHOCARDIOGRAPHY  2003   EYE SURGERY Left 2010   glaucoma   EYE SURGERY Bilateral 2013   cataract extractions and lens implants   HERNIA REPAIR Left 08/31/12   Left Inguinal Hernia Repair   INGUINAL HERNIA REPAIR Left 08/31/2012   Procedure: LAPAROSCOPIC INGUINAL HERNIA;  Surgeon: Lynda Leos, MD;  Location: [facility] OR;  Service:  General;  Laterality: Left;   INSERTION OF MESH Left 08/31/2012   Procedure: INSERTION OF MESH;  Surgeon: Lynda Leos, MD;  Location: [facility] OR;  Service: General;  Laterality: Left;   NM MYOVIEW  LTD  2003       Home Medications    Prior to Admission medications   Medication Sig Start Date End Date Taking? Authorizing Provider  allopurinol (ZYLOPRIM) 300 MG tablet Take 450 mg by mouth daily. 11/08/13  Yes [provider]  benzonatate  (TESSALON ) 100 MG capsule Take 1 capsule (100 mg total) by mouth every 8 (eight) hours. 03/06/24  Yes Juleen Rush, PA-C  brimonidine (ALPHAGAN) 0.2 % ophthalmic solution SMARTSIG:In Eye(s) 03/23/20  Yes [provider]  Cholecalciferol 1000 UNITS capsule Take 1,000 Units by mouth daily.   Yes [provider]  dorzolamide -timolol  (COSOPT ) 22.3-6.8 MG/ML ophthalmic solution Place 1 drop into both eyes 2 (two) times daily.  02/13/12  Yes [provider]  ELIQUIS  5 MG TABS tablet TAKE 1 TABLET TWICE A DAY  11/05/23  Yes Court Dorn PARAS, MD  finasteride  (PROSCAR ) 5 MG tablet Take 5 mg by mouth daily.   Yes [provider]  latanoprost  (XALATAN ) 0.005 % ophthalmic solution Place 1 drop into both eyes daily.   Yes [provider]  levothyroxine (SYNTHROID) 75 MCG tablet Take 75 mcg by mouth daily. 01/01/19  Yes [provider]  losartan  (COZAAR ) 50 MG tablet Take 1 tablet (50 mg total) by mouth daily. 12/15/18  Yes Court Dorn PARAS, MD  Multiple Vitamins-Minerals (CENTRUM SILVER PO) Take 1 tablet by mouth daily.    Yes [provider]  rosuvastatin  (CRESTOR ) 40 MG tablet Take 1 tablet (40 mg total) by mouth daily. 12/15/18  Yes Court Dorn PARAS, MD  sildenafil (VIAGRA) 100 MG tablet Take by mouth.   Yes [provider]  tamsulosin  (FLOMAX ) 0.4 MG CAPS capsule Take 2 capsules (0.8 mg total) by mouth daily. 08/06/18  Yes Luke Vernell BRAVO, MD  Fluocinonide 0.1 % CREA Apply topically. 09/22/20   [provider]  latanoprost  (XALATAN ) 0.005 % ophthalmic solution Place 1 drop into both eyes nightly. 03/10/20   [provider]  levothyroxine (SYNTHROID) 100 MCG tablet TAKE 1 TABLET BY MOUTH IN THE MORNING 30 MINS TO 1 HOUR BEFORE FOOD 10/16/18   [provider]  triamcinolone ointment (KENALOG) 0.1 % Apply 1 application topically 2 (two) times daily as needed (DRY SKIN).     [provider]    Family History Family History  Problem Relation Age of Onset   Heart disease Sister    Heart disease Brother    Cirrhosis Brother    Cancer Sister    Cancer Other        Sister/ not sure what kind    Social History Social History   Tobacco Use   Smoking status: Never   Smokeless tobacco: Never  Vaping Use   Vaping status: Never Used  Substance Use Topics   Alcohol use: No   Drug use: No     Allergies   Porcine (pork) protein-containing drug products   Review of Systems Review of Systems  Constitutional:  Negative for  chills, fatigue and fever.  HENT:  Positive for congestion and rhinorrhea. Negative for ear pain, nosebleeds, postnasal drip, sinus pressure, sinus pain and sore throat.   Eyes:  Negative for pain and redness.  Respiratory:  Positive for cough and shortness of breath. Negative for wheezing.   Gastrointestinal:  Positive for  diarrhea. Negative for abdominal pain, nausea and vomiting.  Musculoskeletal:  Negative for arthralgias and myalgias.  Skin:  Negative for rash.  Neurological:  Negative for light-headedness and headaches.  Hematological:  Negative for adenopathy. Does not bruise/bleed easily.  Psychiatric/Behavioral:  Negative for confusion and sleep disturbance.      Physical Exam Triage Vital Signs ED Triage Vitals  Encounter Vitals Group     BP 03/06/24 0850 120/72     Girls Systolic BP Percentile --      Girls Diastolic BP Percentile --      Boys Systolic BP Percentile --      Boys Diastolic BP Percentile --      Pulse Rate 03/06/24 0850 (!) 56     Resp 03/06/24 0850 (!) 22     Temp 03/06/24 0850 97.9 F (36.6 C)     Temp Source 03/06/24 0850 Oral     SpO2 03/06/24 0850 92 %     Weight --      Height --      Head Circumference --      Peak Flow --      Pain Score 03/06/24 0852 3     Pain Loc --      Pain Education --      Exclude from Growth Chart --    No data found.  Updated Vital Signs BP 120/72   Pulse (!) 56   Temp 97.9 F (36.6 C) (Oral)   Resp (!) 22   SpO2 92%   Visual Acuity Right Eye Distance:   Left Eye Distance:   Bilateral Distance:    Right Eye Near:   Left Eye Near:    Bilateral Near:     Physical Exam Vitals and nursing note reviewed.  Constitutional:      General: He is not in acute distress.    Appearance: Normal appearance. He is not ill-appearing.  HENT:     Head: Normocephalic and atraumatic.     Right Ear: Tympanic membrane and ear canal normal.     Left Ear: Tympanic membrane and ear canal normal.     Nose: No congestion  or rhinorrhea.     Mouth/Throat:     Pharynx: No oropharyngeal exudate or posterior oropharyngeal erythema.  Eyes:     General: No scleral icterus.    Extraocular Movements: Extraocular movements intact.     Conjunctiva/sclera: Conjunctivae normal.  Cardiovascular:     Rate and Rhythm: Normal rate and regular rhythm.     Heart sounds: No murmur heard. Pulmonary:     Effort: Pulmonary effort is normal. Tachypnea present. No respiratory distress.     Breath sounds: Normal breath sounds. No wheezing or rales.     Comments: Speaking in complete sentences Musculoskeletal:     Cervical back: Normal range of motion. No rigidity.  Skin:    Coloration: Skin is not jaundiced.     Findings: No rash.  Neurological:     General: No focal deficit present.     Mental Status: He is alert and oriented to person, place, and time.     Motor: No weakness.     Gait: Gait normal.  Psychiatric:        Mood and Affect: Mood normal.        Behavior: Behavior normal.      UC Treatments / Results  Labs (all labs ordered are listed, but only abnormal results are displayed) Labs Reviewed  POC SOFIA SARS ANTIGEN FIA  EKG   Radiology DG Chest 2 View Result Date: 03/06/2024 CLINICAL DATA:  Cough 2 days with headache, sore throat and nasal congestion. EXAM: CHEST - 2 VIEW COMPARISON:  06/22/2021 FINDINGS: Lungs are somewhat hyperexpanded without focal airspace consolidation or effusion. Mild stable cardiomegaly. Remainder of the exam is unchanged. IMPRESSION: 1. No acute cardiopulmonary disease. 2. Mild stable cardiomegaly. Electronically Signed   By: Toribio Agreste M.D.   On: 03/06/2024 10:23    Procedures Procedures (including critical care time)  Medications Ordered in UC Medications - No data to display  Initial Impression / Assessment and Plan / UC Course  I have reviewed the triage vital signs and the nursing notes.  Pertinent labs & imaging results that were available during my care of  the patient were reviewed by me and considered in my medical decision making (see chart for details).     Symptoms consistent with viral URI. Cough medication sent to pharmacy. Chest x-ray without signs of pneumonia. Discussed results with patient pending radiologist overread.  Radiologist overread confirms no pneumonia. Final Clinical Impressions(s) / UC Diagnoses   Final diagnoses:  Acute cough     Discharge Instructions      Follow up with PCP Return or go to ED if symptoms worsen  Initial read of chest xray without acute abnormalities, pending radiologist overread.  I will contact you to discuss imaging only if radiologist noted a finding that we did not discuss.     ED Prescriptions     Medication Sig Dispense Auth. Provider   benzonatate  (TESSALON ) 100 MG capsule Take 1 capsule (100 mg total) by mouth every 8 (eight) hours. 21 capsule Juleen Rush, PA-C      PDMP not reviewed this encounter.   Juleen Rush, PA-C 03/06/24 1028

## 2024-03-17 ENCOUNTER — Encounter: Payer: Self-pay | Admitting: Podiatry

## 2024-03-17 ENCOUNTER — Ambulatory Visit: Admitting: Podiatry

## 2024-03-17 DIAGNOSIS — Q828 Other specified congenital malformations of skin: Secondary | ICD-10-CM

## 2024-03-17 DIAGNOSIS — M79674 Pain in right toe(s): Secondary | ICD-10-CM | POA: Diagnosis not present

## 2024-03-17 DIAGNOSIS — I739 Peripheral vascular disease, unspecified: Secondary | ICD-10-CM

## 2024-03-17 DIAGNOSIS — M79675 Pain in left toe(s): Secondary | ICD-10-CM

## 2024-03-17 DIAGNOSIS — B351 Tinea unguium: Secondary | ICD-10-CM

## 2024-03-21 NOTE — Progress Notes (Signed)
"  °  Subjective:  Patient ID: Randy Fuller, male    DOB: 08/17/1933,  MRN: [id number]  Randy Fuller presents to clinic today for at risk foot care. Patient has h/o PAD and painful porokeratotic lesion(s) of both feet and painful mycotic toenails that limit ambulation. Painful toenails interfere with ambulation. Aggravating factors include wearing enclosed shoe gear. Pain is relieved with periodic professional debridement. Painful porokeratotic lesions are aggravated when weightbearing with and without shoegear. Pain is relieved with periodic professional debridement.  Chief Complaint  Patient presents with   RFC    RFC Not Diabetic. Windy Coy, MD (PCP), July 2025    New problem(s): None.   PCP is Windy Coy, MD.  Allergies[1]  Review of Systems: Negative except as noted in the HPI.  Objective: No changes noted in today's physical examination. There were no vitals filed for this visit. Randy Fuller is a pleasant 89 y.o. male WD, WN in NAD. AAO x 3.  Vascular Examination: CFT <3 seconds b/l. DP pulses faintly palpable b/l. PT pulses nonpalpable b/l. Digital hair absent. No edema b/l. Skin temperature gradient warm to warm b/l. No pain with calf compression. No ischemia or gangrene. No cyanosis or clubbing noted b/l.    Neurological Examination: Sensation grossly intact b/l with 10 gram monofilament.   Dermatological Examination: Pedal skin warm and supple b/l. No open wounds b/l. No interdigital macerations. Toenails 1-5 b/l thick, discolored, elongated with subungual debris and pain on dorsal palpation.    Porokeratotic  lesion(s) posterolateral heel right foot and submet head 5 b/l.  No erythema, no edema, no drainage, no fluctuance.  Musculoskeletal Examination: Muscle strength 5/5 to all lower extremity muscle groups bilaterally. Hammertoe(s) bilateral great toes.  Radiographs: None  Assessment/Plan: 1. Pain due to onychomycosis of toenails of both  feet   2. Porokeratosis   3. PAD (peripheral artery disease)   -Patient was evaluated today. All questions/concerns addressed on today's visit. -Patient to continue soft, supportive shoe gear daily. -Mycotic toenails 1-5 bilaterally were debrided in length and girth with sterile nail nippers and dremel without incident. -Porokeratotic lesion(s) right heel and submet head 5 b/l pared and enucleated with sterile currette without incident. Total number of lesions debrided=3. -Patient/POA to call should there be question/concern in the interim.   Return in about 3 months (around 06/15/2024).  Randy Fuller, DPM      [place] LOCATION: [address], [place] [address]                   Office [phone]   [facility] LOCATION: [address] [place], [place] [address] Office [phone]     [1]  Allergies Allergen Reactions   Porcine (Pork) Protein-Containing Drug Products Swelling   "

## 2024-05-11 ENCOUNTER — Ambulatory Visit: Admitting: Cardiovascular Disease

## 2024-06-30 ENCOUNTER — Ambulatory Visit: Admitting: Podiatry
